# Patient Record
Sex: Female | Born: 1960 | Race: Black or African American | Hispanic: No | Smoking: Current every day smoker
Health system: Southern US, Community
[De-identification: ages and names within clinical notes are randomized; demographics above are authoritative.]

## PROBLEM LIST (undated history)

## (undated) DIAGNOSIS — I472 Ventricular tachycardia, unspecified: Secondary | ICD-10-CM

## (undated) DIAGNOSIS — Z91199 Patient's noncompliance with other medical treatment and regimen due to unspecified reason: Secondary | ICD-10-CM

## (undated) DIAGNOSIS — I10 Essential (primary) hypertension: Secondary | ICD-10-CM

## (undated) DIAGNOSIS — I255 Ischemic cardiomyopathy: Secondary | ICD-10-CM

## (undated) DIAGNOSIS — F1910 Other psychoactive substance abuse, uncomplicated: Secondary | ICD-10-CM

## (undated) DIAGNOSIS — Z9119 Patient's noncompliance with other medical treatment and regimen: Secondary | ICD-10-CM

## (undated) DIAGNOSIS — E785 Hyperlipidemia, unspecified: Secondary | ICD-10-CM

## (undated) DIAGNOSIS — Z720 Tobacco use: Secondary | ICD-10-CM

## (undated) DIAGNOSIS — F1410 Cocaine abuse, uncomplicated: Secondary | ICD-10-CM

## (undated) DIAGNOSIS — I2510 Atherosclerotic heart disease of native coronary artery without angina pectoris: Secondary | ICD-10-CM

## (undated) DIAGNOSIS — I219 Acute myocardial infarction, unspecified: Secondary | ICD-10-CM

## (undated) DIAGNOSIS — R9431 Abnormal electrocardiogram [ECG] [EKG]: Secondary | ICD-10-CM

## (undated) HISTORY — PX: ABDOMINAL HYSTERECTOMY: SHX81

## (undated) HISTORY — PX: BREAST SURGERY: SHX581

---

## 2002-04-25 HISTORY — PX: CARDIAC CATHETERIZATION: SHX172

## 2002-11-24 ENCOUNTER — Inpatient Hospital Stay (HOSPITAL_COMMUNITY): Admission: EM | Admit: 2002-11-24 | Discharge: 2002-11-28 | Payer: Self-pay | Admitting: Emergency Medicine

## 2002-11-24 ENCOUNTER — Encounter: Payer: Self-pay | Admitting: Emergency Medicine

## 2002-12-12 ENCOUNTER — Encounter: Admission: RE | Admit: 2002-12-12 | Discharge: 2002-12-12 | Payer: Self-pay | Admitting: Internal Medicine

## 2002-12-28 ENCOUNTER — Encounter: Payer: Self-pay | Admitting: Emergency Medicine

## 2002-12-29 ENCOUNTER — Encounter: Payer: Self-pay | Admitting: Emergency Medicine

## 2002-12-29 ENCOUNTER — Inpatient Hospital Stay (HOSPITAL_COMMUNITY): Admission: AD | Admit: 2002-12-29 | Discharge: 2002-12-29 | Payer: Self-pay | Admitting: Obstetrics

## 2003-01-17 ENCOUNTER — Ambulatory Visit: Admission: RE | Admit: 2003-01-17 | Discharge: 2003-01-17 | Payer: Self-pay | Admitting: Obstetrics

## 2003-01-23 ENCOUNTER — Encounter: Admission: RE | Admit: 2003-01-23 | Discharge: 2003-01-23 | Payer: Self-pay | Admitting: Internal Medicine

## 2003-01-30 ENCOUNTER — Inpatient Hospital Stay (HOSPITAL_COMMUNITY): Admission: RE | Admit: 2003-01-30 | Discharge: 2003-02-05 | Payer: Self-pay | Admitting: Obstetrics

## 2003-01-30 ENCOUNTER — Encounter (INDEPENDENT_AMBULATORY_CARE_PROVIDER_SITE_OTHER): Payer: Self-pay

## 2003-02-01 ENCOUNTER — Encounter: Payer: Self-pay | Admitting: Obstetrics & Gynecology

## 2003-11-16 ENCOUNTER — Emergency Department (HOSPITAL_COMMUNITY): Admission: EM | Admit: 2003-11-16 | Discharge: 2003-11-16 | Payer: Self-pay | Admitting: *Deleted

## 2003-11-24 ENCOUNTER — Emergency Department (HOSPITAL_COMMUNITY): Admission: EM | Admit: 2003-11-24 | Discharge: 2003-11-24 | Payer: Self-pay | Admitting: Emergency Medicine

## 2005-02-22 ENCOUNTER — Emergency Department (HOSPITAL_COMMUNITY): Admission: EM | Admit: 2005-02-22 | Discharge: 2005-02-23 | Payer: Self-pay | Admitting: Emergency Medicine

## 2007-04-17 ENCOUNTER — Emergency Department (HOSPITAL_COMMUNITY): Admission: EM | Admit: 2007-04-17 | Discharge: 2007-04-18 | Payer: Self-pay | Admitting: Emergency Medicine

## 2007-04-20 ENCOUNTER — Emergency Department (HOSPITAL_COMMUNITY): Admission: EM | Admit: 2007-04-20 | Discharge: 2007-04-20 | Payer: Self-pay | Admitting: Emergency Medicine

## 2009-03-22 ENCOUNTER — Emergency Department (HOSPITAL_COMMUNITY): Admission: EM | Admit: 2009-03-22 | Discharge: 2009-03-22 | Payer: Self-pay | Admitting: Emergency Medicine

## 2010-09-10 NOTE — Op Note (Signed)
NAME:  Natasha Roach, Natasha Roach                         ACCOUNT NO.:  [id number]   MEDICAL RECORD NO.:  [id number]                   PATIENT TYPE:  INP   LOCATION:  9307                                 FACILITY:  [facility]   PHYSICIAN:  Charles A. Clearance Coots, M.D.             DATE OF BIRTH:  [birth year]   DATE OF PROCEDURE:  01/30/2003  DATE OF DISCHARGE:                                 OPERATIVE REPORT   PREOPERATIVE DIAGNOSES:  1. Menorrhagia.  2. Uterine fibroids.  3. Dermoid cyst of left ovary.   POSTOPERATIVE DIAGNOSES:  1. Menorrhagia.  2. Uterine fibroids.  3. Dermoid cyst of left ovary.  4. Multiple pelvic adhesions.   PROCEDURE:  Total abdominal hysterectomy, left ovarian cystectomy, lysis of  multiple pelvic adhesions.   SURGEON:  Charles A. Clearance Coots, M.D.   ASSISTANT:  Roseanna Rainbow, M.D.   ANESTHESIA:  General anesthesia.   ESTIMATED BLOOD LOSS:  300 mL.   COMPLICATIONS:  None.   DRAINS:  Foley to gravity.   FINDINGS:  Multiple pelvic adhesions between the small-bowel and peritoneal  surfaces and the large-bowel and peritoneal surfaces.  Uterine fibroids.  Large left ovarian cyst.   SPECIMENS:  Uterus with cervix.  Left ovarian cyst.   DESCRIPTION OF PROCEDURE:  The patient was brought to the operating room and  after satisfactory general endotracheal anesthesia, the abdomen was prepped  and draped in the usual sterile fashion.  A Pfannenstiel skin incision was  made through the previous scar from cesarean sections.  Incision was  extended down into the fascia with the scalpel.  Fascia was nicked in the  midline and the fascial incision was extended to the left and to the right  with curved Mayo scissors.  The superior and inferior fascial edges were  taken off the rectus muscles both sharp and blunt dissection.  the rectus  muscle was sharply divided in the midline, both superiorly and inferiorly,  being careful to avoid the urinary bladder inferiorly.  The  peritoneum was  entered digitally and was sharply incised both superiorly and inferiorly,  being careful to avoid the urinary bladder inferiorly.  Upon entering the  peritoneum, it was noted that there were multiple small-bowel adhesions to  the peritoneal surfaces surrounding the uterus and these were sharply  dissected away from the peritoneum.  On deeper surfaces, the colon was also  adhered to the sidewall peritoneum and these were sharply dissected away.  A  few areas of the serosalization of the large and small-bowel were repaired  with interrupted sutures of 3-0 silk.  After all adhesions were sharply  dissected away, the O'Connor-O'Sullivan retractor was then placed in the  incision.  The bowel was packed off and the anterior and posterior blades  were placed.  Kelly forceps were placed across the utero-ovarian ligaments  and round ligaments bilaterally.  The round ligaments were sharply dissected  with the cutting mode  of the Bovie bilaterally and the anterior  vesicouterine peritoneum was sharply dissected and the bladder flap was  separated sharply from the anterior surface of the lower uterine segment and  cervix and the urinary bladder was pushed down and away from the operative  field.  The shiny surface of the cervical area, lower uterine segment could  be visualized easily.  A window in the medial aspect of the broad ligament  was created digitally and the Kelly forceps was advanced downward into this  window and a parametrial clamp was placed across the utero-ovarian ligament  beneath the Kelly forceps through the window created in the broad ligament.  The utero-ovarian ligament was then transected and free tie was placed  beneath the parametrial clamp bilaterally.  The uterine vessels were then  skeletonized bilaterally and a curved parametrial clamps were placed across  the uterine vessels at a right angle bilaterally and uterine vessels were  transected and suture  ligated with transfixion sutures of 0 Vicryl.  The  large uterine fundus with fibroids was then transected away from the lower  uterine segment and submitted to pathology for evaluation.  The cervical end  of the uterus was then grasped with Kocher forceps and cardinal ligaments  were clamped serially bilaterally with parametrial clamps and suture ligated  with transfixion sutures down to the uterosacral ligaments.  The uterosacral  ligaments were then crossclamped with curved parametrial clamps and suture  ligated with transfixion sutures of 0 Vicryl bilaterally.  The parametrial  clamps were then placed across the vaginal cuff bilaterally and the cervix  was amputated away from the vaginal cuff.  The vaginal cuff was closed in  the corners with transfixion sutures of 0 Vicryl and the center of the  vaginal cuff was closed with interrupted sutures of 0 Vicryl.  Hemostasis  was excellent.  The bowel was inspected for any areas of further  deserosalization and there were areas of repair of the serosa was intact and  there was no active bleeding noted.  All pedicles were observed and there  was no active bleeding from the pedicles.  The left ovarian cyst was  actually removed from the left ovary prior to beginning the hysterectomy.  The capsule was circumscribed near the base with scalpel and the capsule was  undermined with Metzenbaum scissors and the large ovarian cyst was removed  intact from the base of the viable ovarian tissue and submitted to pathology  for evaluation.  The viable ovarian tissue that was remaining was then  closed with running 3-0 Monocryl plication sutures of the base continuing  out toward the serosal surface and the serosal surface was closed with  baseball type stitch with a continuation from the suture closure of the  base.  There was no active bleeding noted at the conclusion of the  procedure.  Pelvic cavity was thoroughly irrigated with warm saline solution.   All clots were removed.  All instruments were then retired and  the packing was removed from the abdominal cavity.  The surgical technician  indicated that all sponge, needle and instrument counts were correct.  The  abdomen was then closed as follows.  The rectus muscle was approximated with  interrupted sutures of 0 Monocryl.  The fascia was closed with continuous  sutures of PDS from each corner to the center.  Subcutaneous tissue was  thoroughly irrigated with warm saline solution and all areas of subcutaneous  bleeding were coagulated with the Bovie.  Skin was  then approximated with  continuous subcuticular suture of 3-0 Monocryl.  Sterile bandages applied to  the incision closure.  Surgical technician indicated that the second count  was also correct.  The patient tolerated the procedure well and was  transported to the recovery room in satisfactory condition.                                               Charles A. Clearance Coots, M.D.    CAH/MEDQ  D:  01/30/2003  T:  01/30/2003  Job:  [id number]

## 2010-09-10 NOTE — Discharge Summary (Signed)
NAME:  Natasha Roach, Natasha Roach                         ACCOUNT NO.:  [id number]   MEDICAL RECORD NO.:  [id number]                   PATIENT TYPE:  INP   LOCATION:  9307                                 FACILITY:  [facility]   PHYSICIAN:  Charles A. Clearance Coots, M.D.             DATE OF BIRTH:  January 08, 1961   DATE OF ADMISSION:  01/30/2003  DATE OF DISCHARGE:  02/05/2003                                 DISCHARGE SUMMARY   ADMISSION DIAGNOSES:  1. Menorrhagia.  2. Symptomatic uterine fibroid.  3. Dermoid cyst of left ovary.   DISCHARGE DIAGNOSES:  1. Menorrhagia.  2. Symptomatic uterine fibroid.  3. Dermoid cyst of left ovary.  4. Multiple pelvic adhesions.  5. Status post total abdominal hysterectomy, left ovarian cystectomy, and     lysis of multiple adhesions.   DISPOSITION:  Discharged home in good condition.   REASON FOR ADMISSION:  A 50 year old, black female, G4, P2, with a history  of heavy prolonged periods with severe cramping, worse over the past year.  The patient presented to the emergency room at [facility] for chest  pain after substance abuse with cocaine.  She was admitted and her workup,  which included a CT scan revealed a large dermoid cyst of the ovary  approximately 10 cm in size and also a large posterior uterine fibroid.  The  patient was cleared of coronary artery obstruction on her heart  catheterization and was subsequently discharged from the hospital.  The  chest pain that she had on presentation to [facility] was thought  to be caused by spasm from cocaine abuse.  Upon evaluation in the office,  surgical approaches were discussed with the patient.  Because of the severe  anemia caused by her heavy bleeding and pain with her fibroids, the patient  elected definitive surgical therapy with hysterectomy along with removal of  the dermoid cyst from her ovary.   PAST SURGICAL HISTORY:  1. Cesarean section x 2.  2. Ovarian oophorectomy.  3. Breast  surgery.   PAST MEDICAL HISTORY:  1. Substance abuse.  2. Hypertension.  3. Anemia.   MEDICATIONS:  Norvasc, iron, and Colace.   ALLERGIES:  No known drug allergies.   SOCIAL HISTORY:  Single.  Positive history of substance abuse and tobacco.  Negative history of alcohol.   PHYSICAL EXAMINATION:  GENERAL APPEARANCE:  A well-nourished, well-  developed, black female in no acute distress.  VITAL SIGNS:  Afebrile.  Vital signs were stable.  HEENT:  Missing front teeth upper.  NECK:  Supple without adenopathy.  LUNGS:  Clear to auscultation bilaterally.  HEART:  Regular rate and rhythm.  ABDOMEN:  Soft and nontender.  No organomegaly appreciated.  PELVIC:  Normal external female genitalia.  Vaginal mucosa normal.  Cervix  without lesions, discharge, or bleeding.  Uterus enlarged approximately 12  weeks size and tender.  Fullness in left adnexa and nontender.  EXTREMITIES:  Without cyanosis, clubbing, or edema.   LABORATORY VALUES:  Hemoglobin 12.8, hematocrit 38.6, white blood cell count  11,300, platelets 263,000.  Basic metabolic panel within normal limits.  Coagulation times within normal limits.  Urinalysis within normal limits.   HOSPITAL COURSE:  The patient underwent a total abdominal hysterectomy, left  ovarian cystectomy, and lysis of multiple pelvic adhesions on January 30, 2003.  There were no intraoperative complications.  The postoperative course  was uncomplicated, except for a low-grade increase in temperature.  The  chest x-ray was within normal limits.  The patient continued to improve and  on the day of discharge still had a low-grade increase in temperature of  around 100 degrees, but clinically was in good condition.  The patient was  therefore discharged home on postoperative day #6 in good condition.   DISCHARGE LABORATORY VALUES:  Hemoglobin 10.6, hematocrit 31.5, white blood  cell count 14.6, platelets 252,000.   DISCHARGE MEDICATIONS:  1. Doxycycline  100 mg p.o. twice a day for seven days.  2. Flagyl 500 mg twice a day for seven days.  3. Tylox one to two tablets every three to four hours as needed for pain.  4. Continue vitamins and iron.   DISCHARGE INSTRUCTIONS:  Routine written postsurgical instructions were  given to the patient by booklet.   FOLLOWUP:  She is to follow up with Dr. Clearance Coots in the office at the [facility] in two weeks.                                               Charles A. Clearance Coots, M.D.    CAH/MEDQ  D:  02/05/2003  T:  02/05/2003  Job:  [id number]   cc:   Delbert Harness, MD

## 2011-04-05 ENCOUNTER — Emergency Department (HOSPITAL_COMMUNITY)
Admission: EM | Admit: 2011-04-05 | Discharge: 2011-04-05 | Disposition: A | Discharge status: Home / Self Care | Payer: Self-pay

## 2011-07-26 ENCOUNTER — Other Ambulatory Visit (HOSPITAL_COMMUNITY): Payer: Self-pay | Admitting: Psychiatry

## 2012-09-07 ENCOUNTER — Emergency Department (HOSPITAL_COMMUNITY)
Admission: EM | Admit: 2012-09-07 | Discharge: 2012-09-07 | Disposition: A | Discharge status: Home / Self Care | Payer: Self-pay | Attending: Emergency Medicine | Admitting: Emergency Medicine

## 2012-09-07 ENCOUNTER — Encounter (HOSPITAL_COMMUNITY): Payer: Self-pay

## 2012-09-07 ENCOUNTER — Emergency Department (HOSPITAL_COMMUNITY): Payer: Self-pay

## 2012-09-07 ENCOUNTER — Other Ambulatory Visit: Payer: Self-pay

## 2012-09-07 DIAGNOSIS — F1410 Cocaine abuse, uncomplicated: Secondary | ICD-10-CM | POA: Insufficient documentation

## 2012-09-07 DIAGNOSIS — R079 Chest pain, unspecified: Secondary | ICD-10-CM

## 2012-09-07 DIAGNOSIS — F17200 Nicotine dependence, unspecified, uncomplicated: Secondary | ICD-10-CM | POA: Insufficient documentation

## 2012-09-07 DIAGNOSIS — I252 Old myocardial infarction: Secondary | ICD-10-CM | POA: Insufficient documentation

## 2012-09-07 DIAGNOSIS — N390 Urinary tract infection, site not specified: Secondary | ICD-10-CM | POA: Insufficient documentation

## 2012-09-07 HISTORY — DX: Acute myocardial infarction, unspecified: I21.9

## 2012-09-07 LAB — COMPREHENSIVE METABOLIC PANEL WITH GFR
ALT: 29 U/L (ref 0–35)
Albumin: 4.1 g/dL (ref 3.5–5.2)
Alkaline Phosphatase: 127 U/L — ABNORMAL HIGH (ref 39–117)
Calcium: 9.6 mg/dL (ref 8.4–10.5)
Chloride: 106 meq/L (ref 96–112)
Creatinine, Ser: 1.05 mg/dL (ref 0.50–1.10)
GFR calc Af Amer: 71 mL/min — ABNORMAL LOW (ref >90)

## 2012-09-07 LAB — CBC
HCT: 42.7 % (ref 36.0–46.0)
Hemoglobin: 14.4 g/dL (ref 12.0–15.0)
MCH: 28.1 pg (ref 26.0–34.0)
MCHC: 33.7 g/dL (ref 30.0–36.0)
MCV: 83.2 fL (ref 78.0–100.0)
Platelets: 255 10*3/uL (ref 150–400)
RBC: 5.13 MIL/uL — ABNORMAL HIGH (ref 3.87–5.11)
RDW: 12.8 % (ref 11.5–15.5)
WBC: 14.5 K/uL — ABNORMAL HIGH (ref 4.0–10.5)

## 2012-09-07 LAB — URINALYSIS, ROUTINE W REFLEX MICROSCOPIC
Bilirubin Urine: NEGATIVE
Glucose, UA: NEGATIVE mg/dL
Ketones, ur: NEGATIVE mg/dL
Nitrite: POSITIVE — AB
Protein, ur: NEGATIVE mg/dL
Specific Gravity, Urine: 1.023 (ref 1.005–1.030)
Urobilinogen, UA: 0.2 mg/dL (ref 0.0–1.0)
pH: 5.5 (ref 5.0–8.0)

## 2012-09-07 LAB — URINE MICROSCOPIC-ADD ON

## 2012-09-07 LAB — RAPID URINE DRUG SCREEN, HOSP PERFORMED
Amphetamines: NOT DETECTED
Barbiturates: NOT DETECTED
Benzodiazepines: NOT DETECTED
Cocaine: POSITIVE — AB
Opiates: NOT DETECTED
Tetrahydrocannabinol: NOT DETECTED

## 2012-09-07 LAB — COMPREHENSIVE METABOLIC PANEL
AST: 20 U/L (ref 0–37)
BUN: 17 mg/dL (ref 6–23)
CO2: 26 mEq/L (ref 19–32)
GFR calc non Af Amer: 61 mL/min — ABNORMAL LOW (ref >90)
Glucose, Bld: 95 mg/dL (ref 70–99)
Potassium: 3.9 mEq/L (ref 3.5–5.1)
Sodium: 142 mEq/L (ref 135–145)
Total Bilirubin: 0.3 mg/dL (ref 0.3–1.2)
Total Protein: 7.4 g/dL (ref 6.0–8.3)

## 2012-09-07 LAB — POCT I-STAT TROPONIN I: Troponin i, poc: 0 ng/mL (ref 0.00–0.08)

## 2012-09-07 MED ORDER — CEFPODOXIME PROXETIL 200 MG PO TABS
200.0000 mg | ORAL_TABLET | Freq: Once | ORAL | Status: AC
  Administered 2012-09-07: 200 mg via ORAL
  Filled 2012-09-07: qty 1

## 2012-09-07 MED ORDER — NITROFURANTOIN MONOHYD MACRO 100 MG PO CAPS: 100.0000 mg | ORAL_CAPSULE | Freq: Two times a day (BID) | ORAL | Status: AC

## 2012-09-07 MED ORDER — LORAZEPAM 1 MG PO TABS
1.0000 mg | ORAL_TABLET | Freq: Once | ORAL | Status: AC
  Administered 2012-09-07: 1 mg via ORAL
  Filled 2012-09-07: qty 1

## 2012-09-07 NOTE — ED Provider Notes (Signed)
History     CSN: [id number]  Arrival date & time 09/07/12  2036   First MD Initiated Contact with Patient 09/07/12 2100      Chief Complaint  Patient presents with  . Chest Pain    (Consider location/radiation/quality/duration/timing/severity/associated sxs/prior treatment) HPI Comments: 52 y.o. 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 Pt states that she had stopped using cocaine for several years, but over the past few months she has relapsed. She states she used cocaine last night and then felt a pressure on the left side of her chest and radiation to her left arm -- this worsened this morning / today during work and this prompted her to come to the ER . Pt states the episode was similar to when she used cocaine in the past and it caused her to have a heart attack. She states her pain is almost "completely gone" after she was given nitro.   Patient is a 52 y.o. female presenting with chest pain. The history is provided by the patient.  Chest Pain Pain location:  L chest Pain quality: aching and dull   Pain radiates to:  L arm Pain radiates to the back: no   Pain severity:  Mild Timing:  Constant Context: drug use   Context: not breathing   Relieved by: nitro given en route with improvement of symptoms. Associated symptoms: no abdominal pain, no cough, no dizziness, no dysphagia, no fatigue, no fever, no headache and not vomiting     Past Medical History  Diagnosis Date  . 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 Breast surgery      abcess  . Abdominal hysterectomy      No family history on file.  History  Substance Use Topics  . Smoking status: Current Every Day Smoker  . Smokeless tobacco: Not on file  . Alcohol Use: No    OB History   Grav Para Term Preterm  Abortions TAB SAB Ect Mult Living                  Review of Systems  Constitutional: Negative for fever, chills and fatigue.  HENT: Negative for facial swelling, drooling, trouble swallowing, neck pain and dental problem.   Eyes: Negative for pain, discharge and itching.  Respiratory: Negative for cough, choking, wheezing and stridor.   Cardiovascular: Positive for chest pain.  Gastrointestinal: Negative for vomiting, abdominal pain and diarrhea.  Endocrine: Negative for cold intolerance and heat intolerance.  Genitourinary: Negative for vaginal discharge, difficulty urinating and vaginal pain.  Skin: Negative for pallor and rash.  Neurological: Negative for dizziness, light-headedness and headaches.  Psychiatric/Behavioral: Negative for behavioral problems and agitation.    Allergies  Review of patient's allergies indicates no known allergies.  Home Medications  No current outpatient prescriptions on file.  BP 125/81  Pulse 65  Temp(Src) 98.2 F (36.8 C) (Oral)  Resp 16  Ht 5\' 7"  (1.702 m)  Wt 212 lb (96.163 kg)  BMI 33.2 kg/m2  SpO2 100%  Physical Exam  Constitutional: She is oriented to person, place, and time. She appears well-developed. No distress.  HENT:  Head: Normocephalic and atraumatic.  Eyes: Pupils are equal, round, and reactive to light. Right eye exhibits no discharge. Left eye exhibits  no discharge.  Neck: Neck supple. No tracheal deviation present.  Cardiovascular: Normal rate.  Exam reveals no gallop and no friction rub.   Pulmonary/Chest: No stridor. No respiratory distress. She has no wheezes.  Abdominal: Soft. She exhibits no distension. There is no tenderness. There is no rebound.  Musculoskeletal: She exhibits no edema and no tenderness.  Neurological: She is alert and oriented to person, place, and time.  Skin: Skin is warm. She is not diaphoretic.    ED Course  Procedures (including critical care time)  Labs Reviewed  CBC - Abnormal;  Notable for the following:    WBC 14.5 (*)    RBC 5.13 (*)    All other components within normal limits  COMPREHENSIVE METABOLIC PANEL - Abnormal; Notable for the following:    Alkaline Phosphatase 127 (*)    GFR calc non Af Amer 61 (*)    GFR calc Af Amer 71 (*)    All other components within normal limits  URINALYSIS, ROUTINE W REFLEX MICROSCOPIC - Abnormal; Notable for the following:    APPearance CLOUDY (*)    Hgb urine dipstick MODERATE (*)    Nitrite POSITIVE (*)    Leukocytes, UA MODERATE (*)    All other components within normal limits  URINE RAPID DRUG SCREEN (HOSP PERFORMED) - Abnormal; Notable for the following:    Cocaine POSITIVE (*)    All other components within normal limits  URINE MICROSCOPIC-ADD ON - Abnormal; Notable for the following:    Bacteria, UA MANY (*)    All other components within normal limits  URINE CULTURE  POCT I-STAT TROPONIN I   Dg Chest 2 View  09/07/2012   *RADIOLOGY REPORT*  Clinical Data: Chest pain. Shortness of breath.  CHEST - 2 VIEW  Comparison: 02/22/2005  Findings: Cardiac and mediastinal contours appear normal.  The lungs appear clear.  No pleural effusion is identified.  IMPRESSION:  No significant abnormality identified.   Original Report Authenticated By: Gaylyn Rong, M.D.     ECG shows sinus rhythm, similar to prior ECG, however, today's ECG shows resolution of slightly elevated ST changes in V2/V3 read and resolution of T wave abnormalities.   MDM   Will give ativan for chest pain secondary to most likely etiology being use of cocaine. Have educated pt on cocaine abuse as well and long term consequences.   Pt's trop is negative. She is not having chest pain any longer. Do not suspect ACS. W/ patient's use of cocaine now at near 24 hours ago, would have expected pt's trop to be elevated if truly ACS. Found to have UTI -- will tx with vantin. Pt is discharged and told to f/u with pcp within two days for re-evaluation of  symptoms.   1. UTI (lower urinary tract infection)   2. Cocaine abuse   3. Chest pain              Bernadene Person, MD 09/08/12 1610

## 2012-09-07 NOTE — ED Notes (Signed)
[facility]-pt brought from work.  pt c/o of cp since last night. sts cp started after using cocaine last night.  Pain is mainly in her left chest radiating into her left arm.  Hx of mi 2004-cocaine induced.  Pt was given enroute ASA 324mg  and nitro x 1 with some relief.  22g left hand placed.

## 2012-09-07 NOTE — ED Notes (Signed)
Waiting on medication from pharmacy before discharging patient 

## 2012-09-07 NOTE — ED Provider Notes (Signed)
I saw and evaluated the patient, reviewed the resident's note and I agree with the findings and plan.  EKG at time 20:42, shows sinus rhythm at a rate of 71, RSR Prime in leads V1 and V2, no ST or T-wave abnormalities. Axis is normal. Interpretation is borderline ECG. No significant change compared to EKG from 02/22/2005. Patient with history of coronary disease related to cocaine abuse. Patient is positive for cocaine use here. Patient's symptoms are improved. Plan is to rule out with serial enzymes. EKG shows no new ischemic changes. His troponins are negative, patient continues to be chest pain-free, feel comfortable discharging the patient home with referrals for drug abuse and encouraged followup with primary care physician.  No suspicion for PE.  CXR shows no acute.  Lungs clear Intact peripheral pulses RRR, no murmur   Impression CP Cocaine abuse   Gavin Pound. Oletta Lamas, MD 09/07/12 2322

## 2012-09-08 NOTE — ED Provider Notes (Signed)
I saw and evaluated the patient, reviewed the resident's note and I agree with the findings and plan.  I reviewed and agree with ECG interpretation by Dr. Donette Larry.  Pt after using occaine yesterday has had development of CP similar to prior MI.  This was previously also in setting of cocaine abuse.  Pt is educated.  Troponin is neg.  PT's symptoms resolved in the ED.  No sig ECG changes.  Pt encouraged to stop drug abuse, referrals for outpt abuse treatment   Impression: Cocaine abuse Chest pain   Natasha Roach. Oletta Lamas, MD 09/08/12 1610

## 2012-09-10 LAB — URINE CULTURE: Colony Count: >=100000

## 2012-09-11 ENCOUNTER — Telehealth (HOSPITAL_COMMUNITY): Payer: Self-pay | Admitting: Emergency Medicine

## 2012-09-11 NOTE — ED Notes (Signed)
Post ED Visit - Positive Culture Follow-up  Culture report reviewed by antimicrobial stewardship pharmacist: []  [name], Pharm.D., BCPS [x]  [name], Pharm.D., BCPS []  [name], [address].D., BCPS []  [name], [name], BCPS, AAHIVP []  [name], Pharm.D., BCPS, AAHIV  Positive urine culture Treated with Macrobid, organism sensitive to the same and no further patient follow-up is required at this time.  Natasha Roach 09/11/2012, 9:37 AM

## 2015-12-25 ENCOUNTER — Encounter (HOSPITAL_COMMUNITY): Payer: Self-pay | Admitting: Emergency Medicine

## 2015-12-25 ENCOUNTER — Emergency Department (HOSPITAL_COMMUNITY)
Admission: EM | Admit: 2015-12-25 | Discharge: 2015-12-26 | Disposition: A | Discharge status: Home / Self Care | Payer: Self-pay | Attending: Emergency Medicine | Admitting: Emergency Medicine

## 2015-12-25 DIAGNOSIS — I252 Old myocardial infarction: Secondary | ICD-10-CM | POA: Insufficient documentation

## 2015-12-25 DIAGNOSIS — M5431 Sciatica, right side: Secondary | ICD-10-CM | POA: Insufficient documentation

## 2015-12-25 DIAGNOSIS — F17200 Nicotine dependence, unspecified, uncomplicated: Secondary | ICD-10-CM | POA: Insufficient documentation

## 2015-12-25 NOTE — ED Triage Notes (Signed)
Patient reports persistent right lower back pain radiating to right buttocks and right thigh worse with movement/ambulation onset last week , denies injury or fall . No hematuria or urinary discomfort.

## 2015-12-25 NOTE — ED Provider Notes (Signed)
[facility] DEPT Provider Note   CSN: [id number] Arrival date & time: 12/25/15  2115     History   Chief Complaint Chief Complaint  Patient presents with  . Back Pain    HPI Tonye RoyaltyMary A Roach is a 55 y.o. female.  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  She has history of low back pain. She says that one week ago she accidentally twisted wrong and heard a crunch in her right glute, the next day she saw swelling and as the days progressed she has had pain to the right glut that goes down into her right leg. She has not had any weakness or numbness. The pain is an 8/10, described as sharp, shooting, intermittent, and deep pain.    Past Medical History:  Diagnosis Date  . 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   Past Surgical History:  Procedure Laterality Date  . ABDOMINAL HYSTERECTOMY    . BREAST SURGERY     abcess    OB History    No data available       Home Medications    Prior to Admission medications   Medication Sig Start Date End Date Taking? Authorizing Provider  ibuprofen (ADVIL,MOTRIN) 200 MG tablet Take 600 mg by mouth every 6 (six) hours as needed for moderate pain.   Yes Historical Provider, MD  cyclobenzaprine (FLEXERIL) 10 MG tablet Take 0.5-1 tablets (5-10 mg total) by mouth 2 (two) times daily as needed. 12/26/15   Marlon Peliffany Trev Boley, PA-C  HYDROcodone-acetaminophen (NORCO/VICODIN) 5-325 MG tablet Take 1 tablet by mouth every 6 (six) hours as needed for severe pain. 12/26/15   Phillp Dolores Neva SeatGreene, PA-C  predniSONE (DELTASONE) 10 MG tablet Take 2 tablets (20 mg total) by mouth daily. 12/26/15   Marlon Peliffany Myrtle Haller, PA-C    Family History No family history on file.  Social History Social History  Substance Use Topics  . Smoking status: Current Every Day Smoker  . Smokeless tobacco: Not on file  . Alcohol use No     Allergies   Review  of patient's allergies indicates no known allergies.   Review of Systems Review of Systems  ROS: No TIA's or unusual headaches, no dysphagia.  No prolonged cough. No dyspnea or chest pain on exertion.  No abdominal pain, change in bowel habits, black or bloody stools.  No urinary tract or vaginal symptoms.  No new or unusual musculoskeletal symptoms..  Physical Exam Updated Vital Signs BP 127/74   Pulse 64   Temp 98.6 F (37 C) (Oral)   Resp 17   SpO2 100%   Physical Exam  Constitutional: She appears well-developed and well-nourished.  HENT:  Head: Normocephalic and atraumatic.  Eyes: Conjunctivae are normal. Pupils are equal, round, and reactive to light.  Neck: Trachea normal, normal range of motion and full passive range of motion without pain. Neck supple.  Cardiovascular: Normal rate, regular rhythm and normal pulses.   Pulmonary/Chest: Effort normal and breath sounds normal. Chest wall is not dull to percussion. She exhibits no tenderness, no crepitus, no edema, no deformity and no retraction.  Abdominal: Soft. Normal appearance and bowel sounds are normal.  Musculoskeletal: Normal range of motion.       Right hip: She exhibits tenderness. She exhibits normal range of motion, normal strength, no bony tenderness, no swelling, no crepitus, no deformity and  no laceration.  Pt with pin point pain to the sciatic notch, the pain is illicite to shoot down her right leg with pressure to this area. Intact sensation and normal distal pulses.  Neurological: She is alert. She has normal strength.  Skin: Skin is warm, dry and intact.  Psychiatric: She has a normal mood and affect. Her speech is normal and behavior is normal. Judgment and thought content normal. Cognition and memory are normal.    ED Treatments / Results  Labs (all labs ordered are listed, but only abnormal results are displayed) Labs Reviewed - No data to display  EKG  EKG Interpretation None        Radiology No results found.  Procedures Procedures (including critical care time)  Medications Ordered in ED Medications  predniSONE (DELTASONE) tablet 60 mg (60 mg Oral Given 12/26/15 0037)  oxyCODONE-acetaminophen (PERCOCET/ROXICET) 5-325 MG per tablet 2 tablet (2 tablets Oral Given 12/26/15 0038)  cyclobenzaprine (FLEXERIL) tablet 5 mg (5 mg Oral Given 12/26/15 0037)     Initial Impression / Assessment and Plan / ED Course  I have reviewed the triage vital signs and the nursing notes.  Pertinent labs & imaging results that were available during my care of the patient were reviewed by me and considered in my medical decision making (see chart for details).  Clinical Course    55 y.o.Natasha Roach's  with back pain.   No neurological deficits and normal neuro exam. No loss of bowel or bladder control. No concern for cauda equina at this time base on HPI and physical exam findings. No fever, night sweats, weight loss, h/o cancer, IVDU. The patient can walk with some discomfort.   Patient Plan 1. Medications: NSAIDs and/or muscle relaxer. Cont usual home medications unless otherwise directed. 2. Treatment: rest, drink plenty of fluids, gentle stretching as discussed, alternate ice and heat  3. Follow Up: Please followup with your primary doctor for discussion of your diagnoses and further evaluation after today's visit; if you do not have a primary care doctor use the resource guide provided to find one  Advised to follow-up with the orthopedist if symptoms do not start to resolve in the next 2-3 days. If develop loss of bowel or urinary control return to the ED as soon as possible for further evaluation. To take the medications as prescribed as they can cause harm if not taken appropriately.   Vital signs are stable at discharge. Vitals:   12/25/15 2119 12/26/15 0020  BP: 151/90 127/74  Pulse: 105 64  Resp: 18 17  Temp: 98.6 F (37 C)     Patient/guardian has  voiced understanding and agreed to follow-up with the PCP or specialist.      Final Clinical Impressions(s) / ED Diagnoses   Final diagnoses:  Sciatica of right side    New Prescriptions New Prescriptions   CYCLOBENZAPRINE (FLEXERIL) 10 MG TABLET    Take 0.5-1 tablets (5-10 mg total) by mouth 2 (two) times daily as needed.   HYDROCODONE-ACETAMINOPHEN (NORCO/VICODIN) 5-325 MG TABLET    Take 1 tablet by mouth every 6 (six) hours as needed for severe pain.   PREDNISONE (DELTASONE) 10 MG TABLET    Take 2 tablets (20 mg total) by mouth daily.     Marlon Pel, PA-C 12/26/15 2130    Geoffery Lyons, MD 12/26/15 [phone]

## 2015-12-26 MED ORDER — OXYCODONE-ACETAMINOPHEN 5-325 MG PO TABS
2.0000 | ORAL_TABLET | Freq: Once | ORAL | Status: AC
  Administered 2015-12-26: 2 via ORAL
  Filled 2015-12-26: qty 2

## 2015-12-26 MED ORDER — HYDROCODONE-ACETAMINOPHEN 5-325 MG PO TABS: 1.0000 | ORAL_TABLET | Freq: Four times a day (QID) | ORAL | 0 refills | Status: DC | PRN

## 2015-12-26 MED ORDER — PREDNISONE 20 MG PO TABS
60.0000 mg | ORAL_TABLET | Freq: Once | ORAL | Status: AC
  Administered 2015-12-26: 60 mg via ORAL
  Filled 2015-12-26: qty 3

## 2015-12-26 MED ORDER — CYCLOBENZAPRINE HCL 10 MG PO TABS
5.0000 mg | ORAL_TABLET | Freq: Once | ORAL | Status: AC
  Administered 2015-12-26: 5 mg via ORAL
  Filled 2015-12-26: qty 1

## 2015-12-26 MED ORDER — CYCLOBENZAPRINE HCL 10 MG PO TABS: 5.0000 mg | ORAL_TABLET | Freq: Two times a day (BID) | ORAL | 0 refills | Status: DC | PRN

## 2015-12-26 MED ORDER — PREDNISONE 10 MG PO TABS: 20.0000 mg | ORAL_TABLET | Freq: Every day | ORAL | 0 refills | Status: DC

## 2015-12-31 ENCOUNTER — Inpatient Hospital Stay (HOSPITAL_COMMUNITY)
Admission: EM | Admit: 2015-12-31 | Discharge: 2016-01-02 | DRG: 918 | Disposition: A | Discharge status: Home / Self Care | Payer: Self-pay | Attending: Internal Medicine | Admitting: Internal Medicine

## 2015-12-31 ENCOUNTER — Encounter (HOSPITAL_COMMUNITY): Payer: Self-pay

## 2015-12-31 ENCOUNTER — Inpatient Hospital Stay (HOSPITAL_COMMUNITY): Payer: Self-pay

## 2015-12-31 ENCOUNTER — Ambulatory Visit: Admit: 2015-12-31 | Payer: Self-pay

## 2015-12-31 DIAGNOSIS — I201 Angina pectoris with documented spasm: Secondary | ICD-10-CM | POA: Diagnosis present

## 2015-12-31 DIAGNOSIS — B3749 Other urogenital candidiasis: Secondary | ICD-10-CM | POA: Diagnosis present

## 2015-12-31 DIAGNOSIS — G8929 Other chronic pain: Secondary | ICD-10-CM | POA: Diagnosis present

## 2015-12-31 DIAGNOSIS — F17210 Nicotine dependence, cigarettes, uncomplicated: Secondary | ICD-10-CM | POA: Diagnosis present

## 2015-12-31 DIAGNOSIS — D72829 Elevated white blood cell count, unspecified: Secondary | ICD-10-CM | POA: Diagnosis present

## 2015-12-31 DIAGNOSIS — R079 Chest pain, unspecified: Secondary | ICD-10-CM

## 2015-12-31 DIAGNOSIS — F1410 Cocaine abuse, uncomplicated: Secondary | ICD-10-CM | POA: Diagnosis present

## 2015-12-31 DIAGNOSIS — E785 Hyperlipidemia, unspecified: Secondary | ICD-10-CM | POA: Diagnosis present

## 2015-12-31 DIAGNOSIS — T405X1A Poisoning by cocaine, accidental (unintentional), initial encounter: Principal | ICD-10-CM | POA: Diagnosis present

## 2015-12-31 DIAGNOSIS — Z6831 Body mass index (BMI) 31.0-31.9, adult: Secondary | ICD-10-CM

## 2015-12-31 DIAGNOSIS — K921 Melena: Secondary | ICD-10-CM | POA: Diagnosis present

## 2015-12-31 DIAGNOSIS — I959 Hypotension, unspecified: Secondary | ICD-10-CM | POA: Diagnosis present

## 2015-12-31 DIAGNOSIS — M5440 Lumbago with sciatica, unspecified side: Secondary | ICD-10-CM | POA: Diagnosis present

## 2015-12-31 DIAGNOSIS — I249 Acute ischemic heart disease, unspecified: Secondary | ICD-10-CM | POA: Diagnosis present

## 2015-12-31 DIAGNOSIS — I252 Old myocardial infarction: Secondary | ICD-10-CM

## 2015-12-31 DIAGNOSIS — E1165 Type 2 diabetes mellitus with hyperglycemia: Secondary | ICD-10-CM | POA: Diagnosis present

## 2015-12-31 DIAGNOSIS — A599 Trichomoniasis, unspecified: Secondary | ICD-10-CM | POA: Diagnosis present

## 2015-12-31 HISTORY — DX: Other psychoactive substance abuse, uncomplicated: F19.10

## 2015-12-31 LAB — DIFFERENTIAL
Basophils Absolute: 0.1 10*3/uL (ref 0.0–0.1)
Basophils Relative: 0 %
EOS PCT: 1 %
Eosinophils Absolute: 0.1 10*3/uL (ref 0.0–0.7)
LYMPHS ABS: 5 10*3/uL — AB (ref 0.7–4.0)
LYMPHS PCT: 19 %
MONO ABS: 1.2 10*3/uL — AB (ref 0.1–1.0)
Monocytes Relative: 5 %
NEUTROS ABS: 19.3 10*3/uL — AB (ref 1.7–7.7)
NEUTROS PCT: 75 %

## 2015-12-31 LAB — URINALYSIS, ROUTINE W REFLEX MICROSCOPIC
Bilirubin Urine: NEGATIVE
Glucose, UA: 500 mg/dL — AB
Ketones, ur: NEGATIVE mg/dL
Nitrite: POSITIVE — AB
Protein, ur: NEGATIVE mg/dL
Specific Gravity, Urine: 1.022 (ref 1.005–1.030)
pH: 5.5 (ref 5.0–8.0)

## 2015-12-31 LAB — TROPONIN I: Troponin I: 0.05 ng/mL (ref <0.03)

## 2015-12-31 LAB — TSH: TSH: 0.591 u[IU]/mL (ref 0.350–4.500)

## 2015-12-31 LAB — CBC
HCT: 48.1 % — ABNORMAL HIGH (ref 36.0–46.0)
HEMOGLOBIN: 15.4 g/dL — AB (ref 12.0–15.0)
MCH: 27.5 pg (ref 26.0–34.0)
MCHC: 32 g/dL (ref 30.0–36.0)
MCV: 85.7 fL (ref 78.0–100.0)
PLATELETS: 193 10*3/uL (ref 150–400)
RBC: 5.61 MIL/uL — AB (ref 3.87–5.11)
RDW: 12.7 % (ref 11.5–15.5)
WBC: 26.8 10*3/uL — AB (ref 4.0–10.5)

## 2015-12-31 LAB — BASIC METABOLIC PANEL
ANION GAP: 10 (ref 5–15)
BUN: 14 mg/dL (ref 6–20)
CALCIUM: 8.9 mg/dL (ref 8.9–10.3)
CO2: 21 mmol/L — AB (ref 22–32)
CREATININE: 0.91 mg/dL (ref 0.44–1.00)
Chloride: 105 mmol/L (ref 101–111)
Glucose, Bld: 195 mg/dL — ABNORMAL HIGH (ref 65–99)
Potassium: 4.4 mmol/L (ref 3.5–5.1)
SODIUM: 136 mmol/L (ref 135–145)

## 2015-12-31 LAB — HEPATIC FUNCTION PANEL
ALBUMIN: 3.4 g/dL — AB (ref 3.5–5.0)
ALT: 18 U/L (ref 14–54)
AST: 19 U/L (ref 15–41)
Alkaline Phosphatase: 96 U/L (ref 38–126)
Bilirubin, Direct: <0.1 mg/dL — ABNORMAL LOW (ref 0.1–0.5)
TOTAL PROTEIN: 5.2 g/dL — AB (ref 6.5–8.1)
Total Bilirubin: 0.5 mg/dL (ref 0.3–1.2)

## 2015-12-31 LAB — URINE MICROSCOPIC-ADD ON

## 2015-12-31 LAB — APTT: aPTT: 145 seconds — ABNORMAL HIGH (ref 24–36)

## 2015-12-31 LAB — MAGNESIUM: MAGNESIUM: 2.3 mg/dL (ref 1.7–2.4)

## 2015-12-31 LAB — PROTIME-INR
INR: 1.11
PROTHROMBIN TIME: 14.3 s (ref 11.4–15.2)

## 2015-12-31 LAB — I-STAT TROPONIN, ED: TROPONIN I, POC: 0.01 ng/mL (ref 0.00–0.08)

## 2015-12-31 LAB — MRSA PCR SCREENING: MRSA by PCR: NEGATIVE

## 2015-12-31 SURGERY — LEFT HEART CATH AND CORONARY ANGIOGRAPHY
Anesthesia: LOCAL

## 2015-12-31 MED ORDER — HEPARIN (PORCINE) IN NACL 100-0.45 UNIT/ML-% IJ SOLN
1050.0000 [IU]/h | INTRAMUSCULAR | Status: DC
  Administered 2015-12-31 – 2016-01-01 (×2): 1050 [IU]/h via INTRAVENOUS
  Filled 2015-12-31 (×2): qty 250

## 2015-12-31 MED ORDER — ONDANSETRON HCL 4 MG/2ML IJ SOLN: 4.0000 mg | Freq: Four times a day (QID) | INTRAMUSCULAR | Status: DC | PRN

## 2015-12-31 MED ORDER — NITROGLYCERIN 0.4 MG SL SUBL: 0.4000 mg | SUBLINGUAL_TABLET | SUBLINGUAL | Status: DC | PRN

## 2015-12-31 MED ORDER — ASPIRIN EC 81 MG PO TBEC
81.0000 mg | DELAYED_RELEASE_TABLET | Freq: Every day | ORAL | Status: DC
  Administered 2016-01-01 – 2016-01-02 (×2): 81 mg via ORAL
  Filled 2015-12-31 (×2): qty 1

## 2015-12-31 MED ORDER — ATORVASTATIN CALCIUM 80 MG PO TABS
80.0000 mg | ORAL_TABLET | Freq: Every day | ORAL | Status: DC
  Administered 2016-01-01: 80 mg via ORAL
  Filled 2015-12-31: qty 1

## 2015-12-31 MED ORDER — SODIUM CHLORIDE 0.9 % IV SOLN
INTRAVENOUS | Status: DC
  Administered 2015-12-31 – 2016-01-01 (×2): 75 mL/h via INTRAVENOUS
  Administered 2016-01-01: 10:00:00 via INTRAVENOUS

## 2015-12-31 MED ORDER — HEPARIN BOLUS VIA INFUSION
4000.0000 [IU] | Freq: Once | INTRAVENOUS | Status: DC
  Filled 2015-12-31: qty 4000

## 2015-12-31 MED ORDER — HEPARIN BOLUS VIA INFUSION
3000.0000 [IU] | Freq: Once | INTRAVENOUS | Status: AC
Start: 2015-12-31 — End: 2015-12-31
  Administered 2015-12-31: 3000 [IU] via INTRAVENOUS
  Filled 2015-12-31: qty 3000

## 2015-12-31 MED ORDER — SODIUM CHLORIDE 0.9 % IV BOLUS (SEPSIS)
500.0000 mL | Freq: Once | INTRAVENOUS | Status: AC
  Administered 2015-12-31: 500 mL via INTRAVENOUS

## 2015-12-31 MED ORDER — HYDROCODONE-ACETAMINOPHEN 5-325 MG PO TABS: 1.0000 | ORAL_TABLET | Freq: Four times a day (QID) | ORAL | Status: DC | PRN

## 2015-12-31 MED ORDER — ACETAMINOPHEN 325 MG PO TABS: 650.0000 mg | ORAL_TABLET | ORAL | Status: DC | PRN

## 2015-12-31 NOTE — H&P (Signed)
History and Physical    Natasha Roach [id number] DOB: January 15, 1961 DOA: 12/31/2015   PCP: No PCP Per Patient Chief Complaint:  Chief Complaint  Patient presents with  . Chest Pain    HPI: Natasha Roach is a 55 y.o. female with medical history significant of "heart attack" in 2004 in setting of cocaine use.  Ongoing cocaine use last used crack earlier today she admits.  After using crack she developed gradual onset of central chest pain with radiation to back, nausea.  This onset several hours ago, was severe 10/10.  Patient called EMS.  Brought in initially as a code STEMI.  ED Course: By the time she got to ED, a repeat EKG at 1819 showed inferior lateral T wave inversion but no STEMI.  And 17 mins later at 1833 another repeat EKG looked even better with improvement in T waves in inferior lateral leads.  She also donated plasma this morning.  Review of Systems: Small amount of blood in BM yesterday.  As per HPI otherwise 10 point review of systems negative.    Past Medical History:  Diagnosis Date  . 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 Substance abuse     Past Surgical History:  Procedure Laterality Date  . ABDOMINAL HYSTERECTOMY    . BREAST SURGERY     abcess  . CARDIAC CATHETERIZATION  2004     reports that she has been smoking Cigarettes.  She has a 40.00 pack-year smoking history. She has never used smokeless tobacco. She reports that she uses drugs, including Cocaine. She reports that she does not drink alcohol.  No Known Allergies  No family history on file. No early onset CAD   Prior to Admission medications   Medication Sig Start Date End Date Taking? Authorizing Provider  cyclobenzaprine (FLEXERIL) 10 MG tablet Take 0.5-1 tablets (5-10 mg total) by mouth 2 (two) times daily as needed. 12/26/15   Marlon Peliffany Greene, PA-C  HYDROcodone-acetaminophen (NORCO/VICODIN) 5-325 MG tablet Take 1 tablet by mouth every 6 (six) hours as  needed for severe pain. 12/26/15   Tiffany Neva SeatGreene, PA-C  ibuprofen (ADVIL,MOTRIN) 200 MG tablet Take 600 mg by mouth every 6 (six) hours as needed for moderate pain.    Historical Provider, MD  predniSONE (DELTASONE) 10 MG tablet Take 2 tablets (20 mg total) by mouth daily. 12/26/15   Marlon Peliffany Greene, PA-C    Physical Exam: Vitals:   12/31/15 1815 12/31/15 1845 12/31/15 1900 12/31/15 2000  BP: 119/72 124/72 129/70 132/76  Pulse: 70 (!) 59 63 65  Resp: 20 18 22 23   Temp:      TempSrc:      SpO2: 98% 97% 100% 96%  Weight:      Height:          Constitutional: NAD, calm, comfortable Eyes: PERRL, lids and conjunctivae normal ENMT: Mucous membranes are moist. Posterior pharynx clear of any exudate or lesions.Normal dentition.  Neck: normal, supple, no masses, no thyromegaly Respiratory: clear to auscultation bilaterally, no wheezing, no crackles. Normal respiratory effort. No accessory muscle use.  Cardiovascular: Regular rate and rhythm, no murmurs / rubs / gallops. No extremity edema. 2+ pedal pulses. No carotid bruits.  Abdomen: no tenderness, no masses palpated. No hepatosplenomegaly. Bowel sounds positive.  Musculoskeletal: no clubbing / cyanosis. No joint deformity upper and lower extremities. Good ROM, no contractures. Normal muscle tone.  Skin: no rashes, lesions, ulcers. No induration Neurologic: CN 2-12 grossly intact. Sensation intact, DTR  normal. Strength 5/5 in all 4.  Psychiatric: Normal judgment and insight. Alert and oriented x 3. Normal mood.    Labs on Admission: I have personally reviewed following labs and imaging studies  CBC:  Recent Labs Lab 12/31/15 1800  WBC 26.8*  NEUTROABS 19.3*  HGB 15.4*  HCT 48.1*  MCV 85.7  PLT 193   Basic Metabolic Panel:  Recent Labs Lab 12/31/15 1800  NA 136  K 4.4  CL 105  CO2 21*  GLUCOSE 195*  BUN 14  CREATININE 0.91  CALCIUM 8.9   GFR: Estimated Creatinine Clearance: 82.1 mL/min (by C-G formula based on SCr of  0.91 mg/dL). Liver Function Tests: No results for input(s): AST, ALT, ALKPHOS, BILITOT, PROT, ALBUMIN in the last 168 hours. No results for input(s): LIPASE, AMYLASE in the last 168 hours. No results for input(s): AMMONIA in the last 168 hours. Coagulation Profile: No results for input(s): INR, PROTIME in the last 168 hours. Cardiac Enzymes: No results for input(s): CKTOTAL, CKMB, CKMBINDEX, TROPONINI in the last 168 hours. BNP (last 3 results) No results for input(s): PROBNP in the last 8760 hours. HbA1C: No results for input(s): HGBA1C in the last 72 hours. CBG: No results for input(s): GLUCAP in the last 168 hours. Lipid Profile: No results for input(s): CHOL, HDL, LDLCALC, TRIG, CHOLHDL, LDLDIRECT in the last 72 hours. Thyroid Function Tests: No results for input(s): TSH, T4TOTAL, FREET4, T3FREE, THYROIDAB in the last 72 hours. Anemia Panel: No results for input(s): VITAMINB12, FOLATE, FERRITIN, TIBC, IRON, RETICCTPCT in the last 72 hours. Urine analysis:  Sepsis Labs: @LABRCNTIP (procalcitonin:4,lacticidven:4) )No results found for this or any previous visit (from the past 240 hour(s)).   Radiological Exams on Admission: Dg Chest 2 View  Result Date: 12/31/2015 CLINICAL DATA:  Mid chest pain or bad cough for 9 months, smoker, history MI EXAM: CHEST  2 VIEW COMPARISON:  09/07/2012 FINDINGS: Normal heart size, mediastinal contours, and pulmonary vascularity. Lungs clear. No pleural effusion or pneumothorax. Bones unremarkable. IMPRESSION: No acute abnormalities. Electronically Signed   By: Ulyses Southward M.D.   On: 12/31/2015 19:53    EKG: Independently reviewed.  Assessment/Plan Principal Problem:   Acute coronary syndrome (HCC) Active Problems:   Cocaine abuse    1. 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  Pain down to 1/10 after just 1 SL NTG 1. If pain reoccurs then put on NTG gtt 2. Serial trops 3. See cards consult note 4. A1C, lipid pnl  pending 5. NPO after midnight 2. Hypotension on arrival to ED - 1. Complete resolution with just 500 cc of NS and 1 SL NTG. 2. Despite Leukocytosis, I am somewhat doubtful that this represents full blown sepsis given complete resolution with BP now running 130s after just 500cc NS and 1 SL NTG.  More likely I suspect that hypotension was due to reduced cardiac function during the cocaine induced vasospasm episode which may have improved with improvement in vasospasm by giving SL NTG. 3. Leukocytosis -  1. UA pending 2. BCx pending 3. No other SIRS criteria at the moment so will hold off on empiric ABx for now 4. I agree that the degree of leukocytosis does seem a bit high for just prednisone de margination 5. Repeat CBC in AM 4. Subacute / chronic low back pain - 1. Prescribed vicodin, prednisone, and flexeril by EDP last week 5. Hematochezia - 1. Only scant amount, and doubt major GI bleed, HGB is actually elevated at 15.4 despite the episode  of BRBPR yesterday 6. Cocaine abuse   DVT prophylaxis: Heparin gtt Code Status: Full Family Communication: No family in room Consults called: Cards has seen patient Admission status: Admit to inpatient   Hillary Bow DO [facility] Pager [phone] from 7PM-7AM  If 7AM-7PM, please contact the day physician for the patient [email or web address] Password [facility]  12/31/2015, 8:23 PM

## 2015-12-31 NOTE — Progress Notes (Signed)
ANTICOAGULATION CONSULT NOTE - Initial Consult  Pharmacy Consult for heparin Indication: chest pain/ACS  No Known Allergies  Patient Measurements: Height: 5\' 7"  (170.2 cm) Weight: 202 lb (91.6 kg) IBW/kg (Calculated) : 61.6 Heparin Dosing Weight: 81 kg   Vital Signs: Temp: 97.6 F (36.4 C) (09/07 1758) Temp Source: Oral (09/07 1758) BP: 132/76 (09/07 2000) Pulse Rate: 65 (09/07 2000)  Labs:  Recent Labs  12/31/15 1800  HGB 15.4*  HCT 48.1*  PLT 193  CREATININE 0.91    Estimated Creatinine Clearance: 82.1 mL/min (by C-G formula based on SCr of 0.91 mg/dL).   Medical History: Past Medical History:  Diagnosis Date  . 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 Substance abuse    Assessment: 55 yo female with chest pain and associated nausea. No PTA oral anticoagulation per chart. Hgb 15.4 and platelets 193. Hematochezia reported per note but no frank blood reported on exam by physician. No other signs and symptoms of bleeding noted. Conservative heparin bolus ordered pending hemoccult for hematochezia.   Goal of Therapy:  Heparin level 0.3-0.7 units/ml Monitor platelets by anticoagulation protocol: Yes   Plan:  Heparin bolus 3000 units x1  Start heparin gtt at 1050 units/hr Heparin level in 6 hours Daily heparin level and CBC Monitor for signs and symptoms of bleeding  Follow-up hemoccult results   York CeriseKatherine Cook, PharmD Pharmacy Resident  Pager [phone] 12/31/15 8:15 PM

## 2015-12-31 NOTE — ED Provider Notes (Signed)
[facility] Provider Note   CSN: [id number] Arrival date & time: 12/31/15  1742     History   Chief Complaint Chief Complaint  Patient presents with  . Chest Pain   LEVEL 5 CAVEAT DUE TO ACUITY OF CONDITION  HPI Natasha Roach is a 55 y.o. female.  The history is provided by the patient.  Chest Pain   This is a new problem. Episode onset: several hrs ago. The problem occurs constantly. The problem has been gradually improving. Associated with: cocaine abuse. The pain is present in the substernal region. The quality of the pain is described as pressure-like. Radiates to: back. Associated symptoms include nausea.   PAITENT PRESENTS VIA EMS FOR CHEST PAIN INITIAL EKG SHOWED SIGNS OF ACUTE STEMI NO OTHER DETAILS ARE KNOWN ON ARRIVAL Past Medical History:  Diagnosis Date  . 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 Substance abuse     There are no active problems to display for this patient.   Past Surgical History:  Procedure Laterality Date  . ABDOMINAL HYSTERECTOMY    . BREAST SURGERY     abcess  . CARDIAC CATHETERIZATION  2004    OB History    No data available       Home Medications    Prior to Admission medications   Medication Sig Start Date End Date Taking? Authorizing Provider  cyclobenzaprine (FLEXERIL) 10 MG tablet Take 0.5-1 tablets (5-10 mg total) by mouth 2 (two) times daily as needed. 12/26/15   Marlon Peliffany Greene, PA-C  HYDROcodone-acetaminophen (NORCO/VICODIN) 5-325 MG tablet Take 1 tablet by mouth every 6 (six) hours as needed for severe pain. 12/26/15   Tiffany Neva SeatGreene, PA-C  ibuprofen (ADVIL,MOTRIN) 200 MG tablet Take 600 mg by mouth every 6 (six) hours as needed for moderate pain.    Historical Provider, MD  predniSONE (DELTASONE) 10 MG tablet Take 2 tablets (20 mg total) by mouth daily. 12/26/15   Marlon Peliffany Greene, PA-C    Family History No family history on file.  Social History Social History  Substance Use Topics   . Smoking status: Current Every Day Smoker    Packs/day: 1.00    Types: Cigarettes  . Smokeless tobacco: Never Used  . Alcohol use No     Allergies   Review of patient's allergies indicates no known allergies.   Review of Systems Review of Systems  Unable to perform ROS: Acuity of condition  Cardiovascular: Positive for chest pain.  Gastrointestinal: Positive for nausea.     Physical Exam Updated Vital Signs BP 119/72   Pulse 70   Temp 97.6 F (36.4 C) (Oral)   Resp 20   Ht 5\' 7"  (1.702 m)   Wt 91.6 kg   SpO2 98%   BMI 31.64 kg/m   Physical Exam CONSTITUTIONAL: Well developed, distress noted HEAD: Normocephalic/atraumatic EYES: EOMI ENMT: Mucous membranes moist NECK: supple no meningeal signs CV: S1/S2 noted, no murmurs/rubs/gallops noted LUNGS: Lungs are clear to auscultation bilaterally, no apparent distress ABDOMEN: soft, nontender NEURO: Pt is awake/alert/appropriate, moves all extremitiesx4.  EXTREMITIES: pulses normal/equal, full ROM SKIN: warm, color normal PSYCH: anxious   ED Treatments / Results  Labs (all labs ordered are listed, but only abnormal results are displayed) Labs Reviewed  BASIC METABOLIC PANEL - Abnormal; Notable for the following:       Result Value   CO2 21 (*)    Glucose, Bld 195 (*)    All other components within normal limits  CBC - Abnormal; Notable for the following:    WBC 26.8 (*)    RBC 5.61 (*)    Hemoglobin 15.4 (*)    HCT 48.1 (*)    All other components within normal limits  DIFFERENTIAL - Abnormal; Notable for the following:    Neutro Abs 19.3 (*)    Lymphs Abs 5.0 (*)    Monocytes Absolute 1.2 (*)    All other components within normal limits  CBC WITH DIFFERENTIAL/PLATELET  Rosezena Sensor, ED    EKG  EKG Interpretation  Date/Time:  Thursday December 31 2015 17:47:44 EDT Ventricular Rate:  62 PR Interval:    QRS Duration: 77 QT Interval:  403 QTC Calculation: 410 R Axis:   142 Text  Interpretation:  Sinus or ectopic atrial rhythm Anterolateral infarct, age indeterminate Abnormal T, consider ischemia, lateral leads Baseline wander in lead(s) V1 V2 V4 Abnormal ekg changed from prior Confirmed by Bebe Shaggy  MD, Marlon Vonruden ([id number]) on 12/31/2015 6:18:02 PM       Radiology Dg Chest 2 View  Result Date: 12/31/2015 CLINICAL DATA:  Mid chest pain or bad cough for 9 months, smoker, history MI EXAM: CHEST  2 VIEW COMPARISON:  09/07/2012 FINDINGS: Normal heart size, mediastinal contours, and pulmonary vascularity. Lungs clear. No pleural effusion or pneumothorax. Bones unremarkable. IMPRESSION: No acute abnormalities. Electronically Signed   By: Ulyses Southward M.D.   On: 12/31/2015 19:53    Procedures Procedures (including critical care time)  Medications Ordered in ED Medications  sodium chloride 0.9 % bolus 500 mL (0 mLs Intravenous Stopped 12/31/15 1837)     Initial Impression / Assessment and Plan / ED Course  I have reviewed the triage vital signs and the nursing notes.  Pertinent labs & imaging results that were available during my care of the patient were reviewed by me and considered in my medical decision making (see chart for details).  Clinical Course    Pt seen on arrival with cardiology team (dr eno) We reviewed pre-hospital EKGs as well as current EKG After discussion, cardiology decided to CANCEL code stemi Will continue workup  Pt reports giving plasma today She also admits to cocaine use She described chest pressure It is now improving She is awake/alert, and appears comfortable Workup pending  8:02 PM Pt stable Cardiology to admit  Final Clinical Impressions(s) / ED Diagnoses   Final diagnoses:  Chest pain, rule out acute myocardial infarction    New Prescriptions New Prescriptions   No medications on file     Zadie Rhine, MD 12/31/15 2002

## 2015-12-31 NOTE — ED Notes (Signed)
Per Cardiology STEMI cancelled.

## 2015-12-31 NOTE — ED Notes (Signed)
Witnessed rectal exam performed by Dr. Okey DupreEnd

## 2015-12-31 NOTE — ED Triage Notes (Signed)
Per [facility]: Pt started having chest pain today around 2:30 pm. Pt states that her pain is an 8/10, describes the pain as "pressure". Pt stated that the pain radiated to her back, and was nauseated. Pt was given 324 ASA and 1 nitro with EMS. Pt stated that the nitro did not decrease her pain. EMS states that they noted ST elevation in leads 2, 3, and AVF. Pt has hx of MI and substance abuse. Pt stated that she last used crack today. Pt stated that she also donated plasma today around 11 am.

## 2015-12-31 NOTE — ED Notes (Signed)
Dr. Wickline at bedside.  

## 2015-12-31 NOTE — ED Notes (Signed)
Patient transported to X-ray 

## 2015-12-31 NOTE — H&P (Signed)
Cardiology Consultation    Patient ID: Natasha Roach MRN: [id number], DOB: 10/01/1960 Date of Encounter: 12/31/2015, 7:10 PM Primary Physician: No PCP Per Patient Primary Cardiologist: None  Chief Complaint: Chest and back pain Reason for Admission: Chest pain Requesting MD: ED  HPI: Ms. Natasha Roach is a 55 y/o woman with history of "heart attack" in 2004 in the setting of cocaine use, who presents to the ED via EMS as a "Code STEMI."  The patient reports gradual onset of central chest pain radiating to the back with accompanying nausea several hours ago.  The pain was severe (10/10), prompting her to summon EMS.  EMS EKG's were concerning for subtle inferior ST segment elevation.  She received NTG x 1 and ASA with improvement in the pain.  Currently, she describes vague pressure in the center of her chest (1/10), as well as her chronic low back pain and sciatica.  She endorses mild nausea but denies shortness of breath.  She notes having chills around noon today, about the time that her chest pain and nausea began.  The patient had a runny nose last week, but denies other URI symptoms, abdominal pain, diarrhea, and dysuria.  The patient reports using cocaine this morning, after which she also donated plasma.  She reports having donated plasma several times in the past and never had symptoms like what she experienced today.  She is concerned that her drugs may have been laced with something in an attempt to harm her.  Past Medical History:  Diagnosis Date  . 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 Substance abuse      Surgical History:  Past Surgical History:  Procedure Laterality Date  . ABDOMINAL HYSTERECTOMY    . BREAST SURGERY     abcess  . CARDIAC CATHETERIZATION  2004     Home Meds: Prior to Admission medications   Medication Sig Start Date Chania Kochanski Date Taking? Authorizing Provider  cyclobenzaprine (FLEXERIL) 10 MG tablet Take 0.5-1 tablets (5-10  mg total) by mouth 2 (two) times daily as needed. 12/26/15   Marlon Peliffany Greene, PA-C  HYDROcodone-acetaminophen (NORCO/VICODIN) 5-325 MG tablet Take 1 tablet by mouth every 6 (six) hours as needed for severe pain. 12/26/15   Tiffany Neva SeatGreene, PA-C  ibuprofen (ADVIL,MOTRIN) 200 MG tablet Take 600 mg by mouth every 6 (six) hours as needed for moderate pain.    Historical Provider, MD  predniSONE (DELTASONE) 10 MG tablet Take 2 tablets (20 mg total) by mouth daily. 12/26/15   Marlon Peliffany Greene, PA-C    Allergies: No Known Allergies  Social History   Social History  . Marital status: Significant Other    Spouse name: N/A  . Number of children: N/A  . Years of education: N/A   Occupational History  . Not on file.   Social History Main Topics  . Smoking status: Current Every Day Smoker    Packs/day: 1.00    Years: 40.00    Types: Cigarettes  . Smokeless tobacco: Never Used  . Alcohol use No  . Drug use:     Types: Cocaine     Comment: last use 12/31/15  . Sexual activity: Yes   Other Topics Concern  . Not on file   Social History Narrative  . No narrative on file     No family history on file.  Review of Systems: Patient reports small amount of rectal bleeding with bowel movement yesterday, which is new for her.  She also  reports a non-productive cough for ~8 months.  Otherwise, a 12-system review of systems was performed and is negative except as noted in the HPI.  Labs:   Lab Results  Component Value Date   WBC 26.8 (H) 12/31/2015   HGB 15.4 (H) 12/31/2015   HCT 48.1 (H) 12/31/2015   MCV 85.7 12/31/2015   PLT 193 12/31/2015     Recent Labs Lab 12/31/15 1800  NA 136  K 4.4  CL 105  CO2 21*  BUN 14  CREATININE 0.91  CALCIUM 8.9  GLUCOSE 195*   POC Troponin: 0.01  Radiology/Studies:  CXR Pending  Wt Readings from Last 3 Encounters:  12/31/15 91.6 kg (202 lb)  09/07/12 96.2 kg (212 lb)    EKG: Normal sinus rhythm with non-specific ST segment changes.  No evidence  of STEMI.  Physical Exam: Blood pressure 129/70, pulse 63, temperature 97.6 F (36.4 C), temperature source Oral, resp. rate 22, height 5\' 7"  (1.702 m), weight 91.6 kg (202 lb), SpO2 100 %. Body mass index is 31.64 kg/m. General: Obese woman lying in bed.  She appears anxious. Head: Normocephalic, atraumatic, sclera non-icteric, no xanthomas, nares are without discharge.  Neck: Negative for carotid bruits. JVD not elevated. Lungs: Clear bilaterally to auscultation without wheezes, rales, or rhonchi. Breathing is unlabored. Heart: RRR with S1 S2. No murmurs, rubs, or gallops appreciated. Abdomen: Soft, non-tender, non-distended with normoactive bowel sounds. No hepatomegaly. No rebound/guarding. No obvious abdominal masses. Rectum: Normal tone.  No masses.  Small amount of tan stool.  Sample sent for hemoccult. (performed with pt's RN as chaperone) Msk:  Strength and tone appear normal for age. Extremities: No clubbing or cyanosis. No edema.  Right knee is mildly swollen compared to left, which patient reports is chronic.  Distal pedal pulses are 2+ and equal bilaterally. Neuro: Alert and oriented X 3. No focal deficit. No facial asymmetry. Moves all extremities spontaneously. Psych:  Responds to questions appropriately.    Assessment and Plan  55 y/o woman with history of remote MI in the setting of cocaine use, who present as possible Code STEMI with progressive chest pain this afternoon following cocaine use.  Chest pain: EMS EKG's show sublte inferior ST segment elevations that do not meet STEMI criteria.  EKG here reveals right axis shift and inferior and anterolateral T-wave inversions but no ST-elevation.  He pain has almost completely resolved (currently 1/10) after 1 sublingual NTG.  Pain could certainly be cardiac in nature, given history of remote MI and recent cocaine use. Vasospasm would be a consideration. Will not take for emergent cath at this time, given improved pain and EKG's  without ST-segment elevation.  Admit to stepdown with serial troponins.  Initiate heparin infusion per pharmacy (ACS nomogram) and low-dose ASA.  Check urine drug screen, fasting lipid panel, and hemoglobin A1c.  Initiate statin and prn NTG.  If chest pain does not continue to resolve or worsens, may benefit from NTG infusion.  Avoid beta-blockers, if possible, in light of recurrent cocaine abuse.  Will make NPO after midnight in anticipation of ischemia evaluation tomorrow based on clinical course and troponin trend.  Hypotension: Patient reportedly hypertensive with EMS but found to have borderline low BP upon arrival here.  Hypotension resolved with 500 mL NS bolus.  May be due to volume depletion from plasma donation earlier today.  However, in light of leukocytosis, sepsis is a concern.  No obvious source of infection.  Follow-up CXR.  Consider blood cx, UA, and  empiric antibiotics, per hospitalist service.  Obtain transthoracic echo.  Leukocytosis:  Most likely etiologies would be pulmonary infection, given cough and chest pain, or response to recent corticosteroid use.  Degree of leukocytosis seem a little high for prednisone alone.  Chills earlier today also could indicate infectious process.  Follow-up CXR.  Differential added to CBC in lab.  Consider blood cx, UA, and empiric antibiotics, per hospitalist service.  Back pain: This has been a subacute/chronic issue for the patient.  She was seen in the [facility] ED within the last week for this and was given prednisone, Flexeril, and Vicodin.  Low suspicion for aortic dissection given chronicity of pain and equal BP in both arms.  Follow-up CXR  Continue symptomatic treatment for low-back pain.  Hematochezia: Pt reports scant bowel movement yesterday and last week.  Nauseated today but no abdominal pain or tenderness.  Follow-up CBC.  Await hemoccult; in light of no frank blood, will proceed with heparin  infusion.  Substance abuse: Patient has ongoing tobacco and cocaine use.  Substance abuse counseling provided; patient encouraged to quit.  Zenovia Jordan Cherylyn Sundby MD 12/31/2015, 7:10 PM Pager: [phone]

## 2016-01-01 ENCOUNTER — Encounter (HOSPITAL_COMMUNITY): Payer: Self-pay | Admitting: *Deleted

## 2016-01-01 ENCOUNTER — Inpatient Hospital Stay (HOSPITAL_COMMUNITY): Payer: Self-pay

## 2016-01-01 DIAGNOSIS — R079 Chest pain, unspecified: Secondary | ICD-10-CM

## 2016-01-01 DIAGNOSIS — I249 Acute ischemic heart disease, unspecified: Secondary | ICD-10-CM

## 2016-01-01 LAB — BASIC METABOLIC PANEL
ANION GAP: 9 (ref 5–15)
BUN: 11 mg/dL (ref 6–20)
CALCIUM: 8.7 mg/dL — AB (ref 8.9–10.3)
CO2: 24 mmol/L (ref 22–32)
CREATININE: 0.72 mg/dL (ref 0.44–1.00)
Chloride: 106 mmol/L (ref 101–111)
Glucose, Bld: 150 mg/dL — ABNORMAL HIGH (ref 65–99)
Potassium: 4.4 mmol/L (ref 3.5–5.1)
SODIUM: 139 mmol/L (ref 135–145)

## 2016-01-01 LAB — ECHOCARDIOGRAM COMPLETE
E decel time: 257 msec
EERAT: 5.96
FS: 27 % — AB (ref 28–44)
HEIGHTINCHES: 67.008 in
IVS/LV PW RATIO, ED: 0.92
LA ID, A-P, ES: 32 mm
LA diam end sys: 32 mm
LA diam index: 1.57 cm/m2
LA vol A4C: 32.6 ml
LA vol: 39.4 mL
LAVOLIN: 19.3 mL/m2
LDCA: 3.14 cm2
LV E/e' medial: 5.96
LV TDI E'LATERAL: 12.7
LV e' LATERAL: 12.7 cm/s
LVEEAVG: 5.96
LVOTD: 20 mm
MV Dec: 257
MV Peak grad: 2 mmHg
MV pk A vel: 75.2 m/s
MVPKEVEL: 75.7 m/s
PW: 13.1 mm — AB (ref 0.6–1.1)
RV TAPSE: 21.2 mm
TDI e' medial: 6.85
WEIGHTICAEL: 3248.7 [oz_av]

## 2016-01-01 LAB — CBC
HEMATOCRIT: 44 % (ref 36.0–46.0)
HEMOGLOBIN: 14 g/dL (ref 12.0–15.0)
MCH: 27.5 pg (ref 26.0–34.0)
MCHC: 31.8 g/dL (ref 30.0–36.0)
MCV: 86.3 fL (ref 78.0–100.0)
Platelets: 177 10*3/uL (ref 150–400)
RBC: 5.1 MIL/uL (ref 3.87–5.11)
RDW: 12.8 % (ref 11.5–15.5)
WBC: 19.9 10*3/uL — ABNORMAL HIGH (ref 4.0–10.5)

## 2016-01-01 LAB — LIPID PANEL
Cholesterol: 171 mg/dL (ref 0–200)
HDL: 36 mg/dL — ABNORMAL LOW (ref >40)
LDL CALC: UNDETERMINED mg/dL (ref 0–99)
TRIGLYCERIDES: 858 mg/dL — AB (ref <150)
Total CHOL/HDL Ratio: 4.8 RATIO
VLDL: UNDETERMINED mg/dL (ref 0–40)

## 2016-01-01 LAB — HEPARIN LEVEL (UNFRACTIONATED)
HEPARIN UNFRACTIONATED: 0.48 [IU]/mL (ref 0.30–0.70)
Heparin Unfractionated: 0.35 IU/mL (ref 0.30–0.70)

## 2016-01-01 LAB — TROPONIN I
TROPONIN I: 0.79 ng/mL — AB (ref <0.03)
TROPONIN I: 1.72 ng/mL — AB (ref <0.03)

## 2016-01-01 LAB — OCCULT BLOOD, POC DEVICE: Fecal Occult Bld: NEGATIVE

## 2016-01-01 MED ORDER — DEXTROSE 5 % IV SOLN
1.0000 g | INTRAVENOUS | Status: DC
  Administered 2016-01-01: 1 g via INTRAVENOUS
  Filled 2016-01-01 (×2): qty 10

## 2016-01-01 MED ORDER — METRONIDAZOLE 500 MG PO TABS
500.0000 mg | ORAL_TABLET | Freq: Three times a day (TID) | ORAL | Status: DC
  Administered 2016-01-01 – 2016-01-02 (×3): 500 mg via ORAL
  Filled 2016-01-01 (×3): qty 1

## 2016-01-01 MED ORDER — AMLODIPINE BESYLATE 2.5 MG PO TABS
2.5000 mg | ORAL_TABLET | Freq: Every day | ORAL | Status: DC
  Administered 2016-01-01 – 2016-01-02 (×2): 2.5 mg via ORAL
  Filled 2016-01-01 (×2): qty 1

## 2016-01-01 MED ORDER — FLUCONAZOLE 150 MG PO TABS
150.0000 mg | ORAL_TABLET | Freq: Once | ORAL | Status: AC
  Administered 2016-01-01: 150 mg via ORAL
  Filled 2016-01-01: qty 1

## 2016-01-01 NOTE — Progress Notes (Signed)
Patient Name: Natasha Roach Date of Encounter: 01/01/2016  Primary Cardiologist: Wallace Cullens. End, MD  Hospital Problem List     Principal Problem:   Acute coronary syndrome [facility](HCC) Active Problems:   Cocaine abuse   Leukocytosis   Morbid obesity (HCC)   Subjective   She did have mild chest discomfort at one point overnight.  Currently chest pain free but says that every once in awhile she has a brief twinge of left upper chest pain that is worse with palpation and/or position changes.  She states clearly that she is not interested in cath.  Inpatient Medications    . aspirin EC  81 mg Oral Daily  . atorvastatin  80 mg Oral q1800    Vital Signs    Vitals:   01/01/16 0300 01/01/16 0500 01/01/16 0750 01/01/16 1212  BP: (!) 119/98  135/80 120/72  Pulse: 64  66 (!) 58  Resp: 19  17 16   Temp: 98.7 F (37.1 C)  98.2 F (36.8 C) 98.7 F (37.1 C)  TempSrc: Oral  Oral Oral  SpO2: 95%  98% 96%  Weight:  203 lb 0.7 oz (92.1 kg)    Height:        Intake/Output Summary (Last 24 hours) at 01/01/16 1251 Last data filed at 01/01/16 0837  Gross per 24 hour  Intake              500 ml  Output             1800 ml  Net            -1300 ml   Filed Weights   12/31/15 1805 12/31/15 2036 01/01/16 0500  Weight: 202 lb (91.6 kg) 203 lb 8 oz (92.3 kg) 203 lb 0.7 oz (92.1 kg)    Physical Exam   GEN: Well nourished, well developed, in no acute distress.  HEENT: Grossly normal.  Neck: Supple, no JVD, carotid bruits, or masses. Cardiac: RRR, no murmurs, rubs, or gallops. No clubbing, cyanosis, edema.  Radials/DP/PT 2+ and equal bilaterally.  Respiratory:  Respirations regular and unlabored, clear to auscultation bilaterally. GI: Soft, nontender, nondistended, BS + x 4. MS: no deformity or atrophy. Skin: warm and dry, no rash. Neuro:  Strength and sensation are intact. Psych: AAOx3.  Normal affect.  Labs    CBC  Recent Labs  12/31/15 1800 01/01/16 0220  WBC 26.8* 19.9*    NEUTROABS 19.3*  --   HGB 15.4* 14.0  HCT 48.1* 44.0  MCV 85.7 86.3  PLT 193 177   Basic Metabolic Panel  Recent Labs  12/31/15 1800 12/31/15 2033 01/01/16 0754  NA 136  --  139  K 4.4  --  4.4  CL 105  --  106  CO2 21*  --  24  GLUCOSE 195*  --  150*  BUN 14  --  11  CREATININE 0.91  --  0.72  CALCIUM 8.9  --  8.7*  MG  --  2.3  --    Liver Function Tests  Recent Labs  12/31/15 2041  AST 19  ALT 18  ALKPHOS 96  BILITOT 0.5  PROT 5.2*  ALBUMIN 3.4*   Cardiac Enzymes  Recent Labs  12/31/15 2033 01/01/16 0220 01/01/16 0754  TROPONINI 0.05* 0.79* 1.72*   Fasting Lipid Panel  Recent Labs  01/01/16 0220  CHOL 171  HDL 36*  LDLCALC UNABLE TO CALCULATE IF TRIGLYCERIDE OVER 400 mg/dL  TRIG 409858*  CHOLHDL 4.8  Thyroid Function Tests  Recent Labs  12/31/15 2033  TSH 0.591    Telemetry    rsr  ECG    SB, 59, no acute st/t changes.  Radiology    Dg Chest 2 View  Result Date: 12/31/2015 CLINICAL DATA:  Mid chest pain or bad cough for 9 months, smoker, history MI EXAM: CHEST  2 VIEW COMPARISON:  09/07/2012 FINDINGS: Normal heart size, mediastinal contours, and pulmonary vascularity. Lungs clear. No pleural effusion or pneumothorax. Bones unremarkable. IMPRESSION: No acute abnormalities. Electronically Signed   By: Ulyses Southward M.D.   On: 12/31/2015 19:53   Patient Profile     55 y/o ? with a h/o crack cocaine abuse that presented to the [facility] ED on 9/7, following using crack cocaine earlier in the day and developing c/p.    Assessment & Plan    1.  Acute Coronary Syndrome:  Pt presented 9/7 with chest pain in the setting of crack cocaine usage.  ECG on arrival was notable for inferior and antlat TWI.  This has since resolved.  Troponin has risen to 1.79.  Though this may be related to coronary vasospasm, we have recommended diagnostic catheterization for clarification.  She is not willing to proceed with diagnostic cath.  An echo was performed this  AM - read pending.  We will review.  Will not perform stress testing since she would not want cath if abnl.  Will instead plan med Rx with asa and statin.  Will add low dose amlodipine for presumed coronary vasospasm.  No  blocker in setting of crack usage.  2.  Crack cocaine abuse: she says that she has a 20 yr h/o cocaine usage.  She quit for 9 mos but started up again 2 mos ago.  She is convinced that she was given a bad batch on 9/7, leading to c/p and admission.  Complete cessation advised.  I will ask SW to see - ? Resources in the community.   3.  HTG:  TG 858.  She is currently on statin but if this value is accurate, I would switch to a fibrate.  Repeat in am.  4.  Hyperglycemia: F/u A1c.  Signed, Nicolasa Ducking NP 01/01/2016, 12:51 PM    I have seen, examined and evaluated the patient this PM along with Mr. Brion Aliment, NP.  After reviewing all the available data and chart, we discussed the patients laboratory, study & physical findings as well as symptoms in detail. I agree with his findings, examination as well as impression recommendations as per our discussion.    No further chest pain with minimal troponin elevation. She deathly had transient ischemic EKG changes in the setting of her chest pain yesterday that has now resolved along with her chest pain. She clearly indicates that she did not want cardiac catheterization which is reasonable. She does have an echocardiogram pending and as long as that is normal, there is no pressing requirement for cardiac catheterization.  She was counseled about crack cocaine abuse, and truly admits to being "addict". She needs full counseling and potential resources to assist with this. Since she is not really extended and crack catheterization this point, I agree that doing a stress test is probably not reasonable. Quite likely this was a prolonged coronary spasm and therefore would consider low-dose amlodipine plus minus Imdur.   Bryan Lemma,  M.D., M.S. Interventional Cardiologist   Pager # [phone] Phone # [phone] [address] [place], [place] [address]

## 2016-01-01 NOTE — Hospital Discharge Follow-Up (Signed)
This Case Manager spoke with Tomi BambergerBrenda Graves-Bigelow, RN who indicated patient needing a hospital follow-up appointment. Patient is uninsured and does not have a PCP. Hospital follow-up appointment scheduled for 01/05/16 at 1000 with Scot Juniffany Noel[facility]. Appointment on AVS.  Tomi BambergerBrenda Graves-Bigelow, RN CM updated. She indicated she will inform patient of scheduled appointment.

## 2016-01-01 NOTE — Progress Notes (Signed)
PROGRESS NOTE  Natasha RoyaltyMary A Roach [id number] DOB: March 01, 1961 DOA: 12/31/2015 PCP: No PCP Per Patient  HPI/Recap of past 24 hours:  Feeling better, currently denies chest pain, very regretful about her cocaine use  Assessment/Plan: Principal Problem:   Acute coronary syndrome (HCC) Active Problems:   Cocaine abuse   Leukocytosis   Morbid obesity (HCC)   1. 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  Pain down to 1/10 after just 1 SL NTG 1. A1C pending, lipid pnl with significantly elevated triglyceride, will repeat lipid panel in am 2. Cardiology consulted, patient declined cardiac cath 3. Echo pending 4. Cardiology started asa/statin/low dose norvasc+/-imdur for vasospasm, avoid betablocker in cocaine use 2. Hypotension on arrival to ED - 1. Complete resolution with just 500 cc of NS and 1 SL NTG.  3. Leukocytosis -  1. UA + uti +trichomonas, + yeast, she report abnormal vaginal discharge 2. BCx pending 3. Start rocephin, flagyl, diflucan x1 dose  4. Subacute / chronic low back pain - 1. Prescribed vicodin, prednisone, and flexeril by EDP last week  5. Hematochezia - 1. Only scant amount, and doubt major GI bleed, HGB is actually elevated at 15.4 despite the episode of BRBPR yesterday 2. From hemorrhoids? hgb stable  6. Cocaine abuse, cessation education provided   DVT prophylaxis: Heparin gtt Code Status: Full Family Communication: No family in room Consults called: Cards    Procedures:  none  Antibiotics:  none   Objective: BP 120/72 (BP Location: Right Arm)   Pulse (!) 58   Temp 98.7 F (37.1 C) (Oral)   Resp 16   Ht 5' 7.01" (1.702 m)   Wt 92.1 kg (203 lb 0.7 oz)   SpO2 96%   BMI 31.79 kg/m   Intake/Output Summary (Last 24 hours) at 01/01/16 1354 Last data filed at 01/01/16 0837  Gross per 24 hour  Intake              500 ml  Output             1800 ml  Net            -1300 ml   Filed Weights   12/31/15 1805 12/31/15  2036 01/01/16 0500  Weight: 91.6 kg (202 lb) 92.3 kg (203 lb 8 oz) 92.1 kg (203 lb 0.7 oz)    Exam:   General:  NAD  Cardiovascular: RRR  Respiratory: CTABL  Abdomen: Soft/ND/NT, positive BS  Musculoskeletal: No Edema  Neuro: aaox3  Data Reviewed: Basic Metabolic Panel:  Recent Labs Lab 12/31/15 1800 12/31/15 2033 01/01/16 0754  NA 136  --  139  K 4.4  --  4.4  CL 105  --  106  CO2 21*  --  24  GLUCOSE 195*  --  150*  BUN 14  --  11  CREATININE 0.91  --  0.72  CALCIUM 8.9  --  8.7*  MG  --  2.3  --    Liver Function Tests:  Recent Labs Lab 12/31/15 2041  AST 19  ALT 18  ALKPHOS 96  BILITOT 0.5  PROT 5.2*  ALBUMIN 3.4*   No results for input(s): LIPASE, AMYLASE in the last 168 hours. No results for input(s): AMMONIA in the last 168 hours. CBC:  Recent Labs Lab 12/31/15 1800 01/01/16 0220  WBC 26.8* 19.9*  NEUTROABS 19.3*  --   HGB 15.4* 14.0  HCT 48.1* 44.0  MCV 85.7 86.3  PLT 193 177   Cardiac Enzymes:  Recent Labs Lab 12/31/15 2033 01/01/16 0220 01/01/16 0754  TROPONINI 0.05* 0.79* 1.72*   BNP (last 3 results) No results for input(s): BNP in the last 8760 hours.  ProBNP (last 3 results) No results for input(s): PROBNP in the last 8760 hours.  CBG: No results for input(s): GLUCAP in the last 168 hours.  Recent Results (from the past 240 hour(s))  MRSA PCR Screening     Status: None   Collection Time: 12/31/15  9:15 PM  Result Value Ref Range Status   MRSA by PCR NEGATIVE NEGATIVE Final    Comment:        The GeneXpert MRSA Assay (FDA approved for NASAL specimens only), is one component of a comprehensive MRSA colonization surveillance program. It is not intended to diagnose MRSA infection nor to guide or monitor treatment for MRSA infections.   Culture, blood (routine x 2)     Status: None (Preliminary result)   Collection Time: 12/31/15 10:28 PM  Result Value Ref Range Status   Specimen Description BLOOD RIGHT  ANTECUBITAL  Final   Special Requests BOTTLES DRAWN AEROBIC ONLY 9CC  Final   Culture NO GROWTH < 12 HOURS  Final   Report Status PENDING  Incomplete  Culture, blood (routine x 2)     Status: None (Preliminary result)   Collection Time: 12/31/15 10:36 PM  Result Value Ref Range Status   Specimen Description BLOOD RIGHT HAND  Final   Special Requests BOTTLES DRAWN AEROBIC AND ANAEROBIC 5CC EA  Final   Culture NO GROWTH < 12 HOURS  Final   Report Status PENDING  Incomplete     Studies: Dg Chest 2 View  Result Date: 12/31/2015 CLINICAL DATA:  Mid chest pain or bad cough for 9 months, smoker, history MI EXAM: CHEST  2 VIEW COMPARISON:  09/07/2012 FINDINGS: Normal heart size, mediastinal contours, and pulmonary vascularity. Lungs clear. No pleural effusion or pneumothorax. Bones unremarkable. IMPRESSION: No acute abnormalities. Electronically Signed   By: Ulyses Southward M.D.   On: 12/31/2015 19:53    Scheduled Meds: . amLODipine  2.5 mg Oral Daily  . aspirin EC  81 mg Oral Daily  . atorvastatin  80 mg Oral q1800    Continuous Infusions: . sodium chloride 75 mL/hr at 01/01/16 1025  . heparin 1,050 Units/hr (12/31/15 2100)     Time spent:  Sylar Voong MD, PhD  [facility] Pager [phone]. If 7PM-7AM, please contact night-coverage at [email or web address], password [facility] 01/01/2016, 1:54 PM  LOS: 1 day

## 2016-01-01 NOTE — Progress Notes (Signed)
ANTICOAGULATION CONSULT NOTE   Pharmacy Consult for heparin Indication: chest pain/ACS  No Known Allergies  Patient Measurements: Height: 5' 7.01" (170.2 cm) Weight: 203 lb 0.7 oz (92.1 kg) IBW/kg (Calculated) : 61.62 Heparin Dosing Weight: 81 kg   Vital Signs: Temp: 98.2 F (36.8 C) (09/08 0750) Temp Source: Oral (09/08 0750) BP: 135/80 (09/08 0750) Pulse Rate: 66 (09/08 0750)  Labs:  Recent Labs  12/31/15 1800 12/31/15 2033 12/31/15 2228 01/01/16 0220 01/01/16 0754  HGB 15.4*  --   --  14.0  --   HCT 48.1*  --   --  44.0  --   PLT 193  --   --  177  --   APTT  --   --  145*  --   --   LABPROT  --   --  14.3  --   --   INR  --   --  1.11  --   --   HEPARINUNFRC  --   --   --  0.35 0.48  CREATININE 0.91  --   --   --   --   TROPONINI  --  0.05*  --  0.79*  --     Estimated Creatinine Clearance: 82.3 mL/min (by C-G formula based on SCr of 0.91 mg/dL).   Medical History: Past Medical History:  Diagnosis Date  . 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 Substance abuse    Assessment: 55 yo female with chest pain and associated nausea (troponin trend up). No PTA oral anticoagulation per chart.Hematochezia reported per note but no frank blood reported on exam by physician (hemoccult pending)  -heparin level=  0.48, CBC stable  Goal of Therapy:  Heparin level 0.3-0.7 units/ml Monitor platelets by anticoagulation protocol: Yes   Plan:  Continue heparin gtt at 1050 units/hr Daily heparin level and CBC Follow-up hemoccult results   Harland Germanndrew Antion Andres, Pharm D 01/01/2016 8:50 AM

## 2016-01-01 NOTE — Progress Notes (Signed)
ANTICOAGULATION CONSULT NOTE - Follow Up Consult  Pharmacy Consult for heparin Indication: chest pain/ACS  Labs:  Recent Labs  12/31/15 1800 12/31/15 2033 12/31/15 2228 01/01/16 0220  HGB 15.4*  --   --  14.0  HCT 48.1*  --   --  44.0  PLT 193  --   --  177  APTT  --   --  145*  --   LABPROT  --   --  14.3  --   INR  --   --  1.11  --   HEPARINUNFRC  --   --   --  0.35  CREATININE 0.91  --   --   --   TROPONINI  --  0.05*  --   --     Assessment/Plan:  55yo female therapeutic on heparin with initial dosing for CP. Will continue gtt at current rate and confirm stable with additional level.   Vernard GamblesVeronda Sidi Roach, PharmD, BCPS  01/01/2016,3:07 AM

## 2016-01-01 NOTE — Care Management Note (Signed)
Case Management Note  Patient Details  Name: Natasha RoyaltyMary A Roach MRN: [id number] Date of Birth: 23-Jul-1960  Subjective/Objective: Pt presented for chest pain. Pt is without PCP and Insurance.                    Action/Plan: CM was able to speak with [facility] Liaison Peterson LombardJessica Beck in ref to hospital f/u and she was able to schedule an appointment. Appointment will be placed on AVS. Hopefully pt will be able to be d/c on generic medications. [facility] Tuesday [facility]-16. Pt will be able to utilize pharmacy then with cost ranging from $4-$10.00. No further needs at this time.   Expected Discharge Date:                  Expected Discharge Plan:  Home/Self Care  In-House Referral:  NA  Discharge planning Services  CM Consult, Follow-up appt scheduled, Indigent [facility], Medication Assistance  Post Acute Care Choice:  NA Choice offered to:  NA  DME Arranged:  N/A DME Agency:  NA  HH Arranged:  NA HH Agency:  NA  Status of Service:  Completed, signed off  If discussed at Long Length of Stay Meetings, dates discussed:    Additional Comments:  Gala LewandowskyGraves-Bigelow, Finnegan Gatta Kaye, RN 01/01/2016, 11:17 AM

## 2016-01-01 NOTE — Progress Notes (Signed)
*  PRELIMINARY RESULTS* Echocardiogram 2D Echocardiogram has been performed.  Jeryl Columbialliott, Jamel Dunton 01/01/2016, 11:46 AM

## 2016-01-02 LAB — BASIC METABOLIC PANEL
Anion gap: 9 (ref 5–15)
BUN: 9 mg/dL (ref 6–20)
CHLORIDE: 104 mmol/L (ref 101–111)
CO2: 24 mmol/L (ref 22–32)
CREATININE: 0.46 mg/dL (ref 0.44–1.00)
Calcium: 8.7 mg/dL — ABNORMAL LOW (ref 8.9–10.3)
GFR calc non Af Amer: >60 mL/min (ref >60)
GLUCOSE: 138 mg/dL — AB (ref 65–99)
Potassium: 4.4 mmol/L (ref 3.5–5.1)
Sodium: 137 mmol/L (ref 135–145)

## 2016-01-02 LAB — URINE CULTURE

## 2016-01-02 LAB — LIPID PANEL
Cholesterol: 232 mg/dL — ABNORMAL HIGH (ref 0–200)
LDL Cholesterol: UNDETERMINED mg/dL (ref 0–99)
Triglycerides: 2229 mg/dL — ABNORMAL HIGH (ref <150)
VLDL: UNDETERMINED mg/dL (ref 0–40)

## 2016-01-02 LAB — HEPARIN LEVEL (UNFRACTIONATED): Heparin Unfractionated: 0.39 IU/mL (ref 0.30–0.70)

## 2016-01-02 LAB — CBC
HEMATOCRIT: 42.4 % (ref 36.0–46.0)
Hemoglobin: 13.7 g/dL (ref 12.0–15.0)
MCH: 28 pg (ref 26.0–34.0)
MCHC: 32.3 g/dL (ref 30.0–36.0)
MCV: 86.5 fL (ref 78.0–100.0)
PLATELETS: 196 10*3/uL (ref 150–400)
RBC: 4.9 MIL/uL (ref 3.87–5.11)
RDW: 13.1 % (ref 11.5–15.5)
WBC: 14.7 10*3/uL — AB (ref 4.0–10.5)

## 2016-01-02 LAB — HEMOGLOBIN A1C
HEMOGLOBIN A1C: 7 % — AB (ref 4.8–5.6)
Mean Plasma Glucose: 154 mg/dL

## 2016-01-02 LAB — TROPONIN I: TROPONIN I: 0.65 ng/mL — AB (ref <0.03)

## 2016-01-02 LAB — HIV ANTIBODY (ROUTINE TESTING W REFLEX): HIV Screen 4th Generation wRfx: NONREACTIVE

## 2016-01-02 LAB — MAGNESIUM: Magnesium: 2.6 mg/dL — ABNORMAL HIGH (ref 1.7–2.4)

## 2016-01-02 MED ORDER — NITROGLYCERIN 0.4 MG SL SUBL: 0.4000 mg | SUBLINGUAL_TABLET | SUBLINGUAL | 0 refills | Status: DC | PRN

## 2016-01-02 MED ORDER — FLUCONAZOLE 150 MG PO TABS: 150.0000 mg | ORAL_TABLET | Freq: Every day | ORAL | 0 refills | Status: DC

## 2016-01-02 MED ORDER — CEPHALEXIN 500 MG PO CAPS: 500.0000 mg | ORAL_CAPSULE | Freq: Two times a day (BID) | ORAL | 0 refills | Status: DC

## 2016-01-02 MED ORDER — ATORVASTATIN CALCIUM 80 MG PO TABS: 80.0000 mg | ORAL_TABLET | Freq: Every day | ORAL | 0 refills | Status: DC

## 2016-01-02 MED ORDER — METFORMIN HCL 500 MG PO TABS
500.0000 mg | ORAL_TABLET | Freq: Two times a day (BID) | ORAL | 0 refills | Status: DC
Start: 2016-01-02 — End: 2016-04-23

## 2016-01-02 MED ORDER — ASPIRIN 81 MG PO TBEC: 81.0000 mg | DELAYED_RELEASE_TABLET | Freq: Every day | ORAL | 0 refills | Status: DC

## 2016-01-02 MED ORDER — METRONIDAZOLE 500 MG PO TABS: 500.0000 mg | ORAL_TABLET | Freq: Three times a day (TID) | ORAL | 0 refills | Status: DC

## 2016-01-02 MED ORDER — AMLODIPINE BESYLATE 2.5 MG PO TABS: 2.5000 mg | ORAL_TABLET | Freq: Every day | ORAL | 0 refills | Status: DC

## 2016-01-02 MED ORDER — FENOFIBRATE 145 MG PO TABS: 145.0000 mg | ORAL_TABLET | Freq: Every day | ORAL | 0 refills | Status: DC

## 2016-01-02 NOTE — Progress Notes (Signed)
Patient Name: Natasha Roach Date of Encounter: 01/02/2016  Primary Cardiologist: Wallace Cullens, MD  Hospital Problem List     Principal Problem:   Acute coronary syndrome [facility]) Active Problems:   Cocaine abuse   Leukocytosis   Morbid obesity (HCC)   Subjective    55 y.o. female with medical history significant of "heart attack" in 2004 in setting of cocaine use.  Ongoing cocaine use last used crack earlier today she admits.  After using crack she developed gradual onset of central chest pain with radiation to back, nausea.  This onset several hours ago, was severe 10/10.  Patient called EMS.  Brought in initially as a code STEMI    Inpatient Medications    . amLODipine  2.5 mg Oral Daily  . aspirin EC  81 mg Oral Daily  . atorvastatin  80 mg Oral q1800  . cefTRIAXone (ROCEPHIN)  IV  1 g Intravenous Q24H  . metroNIDAZOLE  500 mg Oral Q8H    Vital Signs    Vitals:   01/02/16 0032 01/02/16 0400 01/02/16 0758 01/02/16 1152  BP: 112/65 120/74 107/85 131/83  Pulse: 67 62 75 67  Resp: 19 19 18 15   Temp: 98.3 F (36.8 C) 97.8 F (36.6 C) 98.4 F (36.9 C) 98.3 F (36.8 C)  TempSrc:   Oral Oral  SpO2: 95% 93% 95% 99%  Weight:  204 lb 11.2 oz (92.9 kg)    Height:        Intake/Output Summary (Last 24 hours) at 01/02/16 1215 Last data filed at 01/02/16 0900  Gross per 24 hour  Intake          1833.65 ml  Output             3850 ml  Net         -2016.35 ml   Filed Weights   12/31/15 2036 01/01/16 0500 01/02/16 0400  Weight: 203 lb 8 oz (92.3 kg) 203 lb 0.7 oz (92.1 kg) 204 lb 11.2 oz (92.9 kg)    Physical Exam   GEN: Well nourished, well developed, in no acute distress.  HEENT: Grossly normal.  Neck: Supple, no JVD, carotid bruits, or masses. Cardiac: RRR, no murmurs, rubs, or gallops. No clubbing, cyanosis, edema.  Radials/DP/PT 2+ and equal bilaterally.  Respiratory:  Respirations regular and unlabored, clear to auscultation bilaterally. GI: Soft, nontender,  nondistended, BS + x 4. MS: no deformity or atrophy. Skin: warm and dry, no rash. Neuro:  Strength and sensation are intact. Psych: AAOx3.  Normal affect.  Labs    CBC  Recent Labs  12/31/15 1800 01/01/16 0220 01/02/16 0453  WBC 26.8* 19.9* 14.7*  NEUTROABS 19.3*  --   --   HGB 15.4* 14.0 13.7  HCT 48.1* 44.0 42.4  MCV 85.7 86.3 86.5  PLT 193 177 196   Basic Metabolic Panel  Recent Labs  12/31/15 2033 01/01/16 0754 01/02/16 0453  NA  --  139 137  K  --  4.4 4.4  CL  --  106 104  CO2  --  24 24  GLUCOSE  --  150* 138*  BUN  --  11 9  CREATININE  --  0.72 0.46  CALCIUM  --  8.7* 8.7*  MG 2.3  --  2.6*   Liver Function Tests  Recent Labs  12/31/15 2041  AST 19  ALT 18  ALKPHOS 96  BILITOT 0.5  PROT 5.2*  ALBUMIN 3.4*   Cardiac Enzymes  Recent  Labs  01/01/16 0220 01/01/16 0754 01/02/16 0453  TROPONINI 0.79* 1.72* 0.65*   Fasting Lipid Panel  Recent Labs  01/02/16 0453  CHOL 232*  HDL NOT REPORTED DUE TO HIGH TRIGLYCERIDES  LDLCALC UNABLE TO CALCULATE IF TRIGLYCERIDE OVER 400 mg/dL  TRIG 9,1472,229*  CHOLHDL NOT REPORTED DUE TO HIGH TRIGLYCERIDES   Thyroid Function Tests  Recent Labs  12/31/15 2033  TSH 0.591    Telemetry    rsr  ECG    SB, 59, no acute st/t changes.  Radiology    Dg Chest 2 View  Result Date: 12/31/2015 CLINICAL DATA:  Mid chest pain or bad cough for 9 months, smoker, history MI EXAM: CHEST  2 VIEW COMPARISON:  09/07/2012 FINDINGS: Normal heart size, mediastinal contours, and pulmonary vascularity. Lungs clear. No pleural effusion or pneumothorax. Bones unremarkable. IMPRESSION: No acute abnormalities. Electronically Signed   By: Ulyses SouthwardMark  Boles M.D.   On: 12/31/2015 19:53   Patient Profile     55 y/o ? with a h/o crack cocaine abuse that [facility], following using crack cocaine earlier in the day and developing c/p.    Assessment & Plan    1.  Acute Coronary Syndrome:  Pt presented 9/7 with  chest pain in the setting of crack cocaine usage.  ECG on arrival was notable for inferior and antlat TWI.  This has since resolved.  Troponin has risen to 1.79.  Though this may be related to coronary vasospasm, we have recommended diagnostic catheterization for clarification.  She is not willing t   Will plan med Rx with asa and statin.  Will add low dose amlodipine for presumed coronary vasospasm.  No  blocker in setting of crack usage.  2.  Crack cocaine abuse: she says that she has a 20 yr h/o cocaine usage.  She quit for 9 mos but started up again 2 mos ago.  She is convinced that she was given a bad batch on 9/7, leading to c/p and admission.  Complete cessation advised.  I will ask SW to see - ? Resources in the community.   3.  HTG:  TG are markedly elevated.  Would DC on Atorvastatin and fenofibrate.   4.  Hyperglycemia: F/u A1c.  Kristeen MissPhilip Filbert Craze, MD  01/02/2016 12:28 PM    [facility] [address],  [address] [place], [place]  [address] Pager [phone] Phone: [phone]; Fax: [phone]

## 2016-01-02 NOTE — Discharge Instructions (Signed)

## 2016-01-02 NOTE — Discharge Summary (Addendum)
Discharge Summary  Natasha Roach ZOX:[id number] DOB: 01-07-1961  PCP: No PCP Per Patient  Admit date: 12/31/2015 Discharge date: 01/02/2016  Time spent: >53mins, more than 50% time spent on care coordination  Recommendations for Outpatient Follow-up:  1. F/u with PMD within a week  for hospital discharge follow up, repeat cbc/bmp at follow up, pmd to continue monitor blood sugar and lipid control. pmd to follow up on final blood culture result. 2. Social worker consulted for substance abuse ( cocaine use)  Discharge Diagnoses:  Active Hospital Problems   Diagnosis Date Noted  . Acute coronary syndrome (HCC) 12/31/2015  . Morbid obesity (HCC) 01/01/2016  . Cocaine abuse 12/31/2015  . Leukocytosis 12/31/2015    Resolved Hospital Problems   Diagnosis Date Noted Date Resolved  No resolved problems to display.    Discharge Condition: stable  Diet recommendation: heart healthy/carb modified  Filed Weights   12/31/15 2036 01/01/16 0500 01/02/16 0400  Weight: 92.3 kg (203 lb 8 oz) 92.1 kg (203 lb 0.7 oz) 92.9 kg (204 lb 11.2 oz)    History of present illness:  HPI: Natasha Roach is a 55 y.o. female with medical history significant of "heart attack" in 2004 in setting of cocaine use.  Ongoing cocaine use last used crack earlier today she admits.  After using crack she developed gradual onset of central chest pain with radiation to back, nausea.  This onset several hours ago, was severe 10/10.  Patient called EMS.  Brought in initially as a code STEMI.  ED Course: By the time she got to ED, a repeat EKG at 1819 showed inferior lateral T wave inversion but no STEMI.  And 17 mins later at 1833 another repeat EKG looked even better with improvement in T waves in inferior lateral leads.  She also donated plasma this morning.  Hospital Course:  Principal Problem:   Acute coronary syndrome [facility]) Active Problems:   Cocaine abuse   Leukocytosis   Morbid obesity  (HCC)   1. 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 Pain down to 1/10 after just 1 SL NTG 1. A1C 7, lipid pnl with significantly elevated triglyceride, repeat lipid panel the same, troponin peaked at 1.72 2. Cardiology consulted, patient declined cardiac cath 3. Echo LVEF 55%, no wall motion abnormalities  4. Cardiology started asa/statin/low dose norvasc for vasospasm, avoid betablocker in cocaine use 5. She is chest pain free at time of discharge. 2. Hypotension on arrival to ED - 1. Complete resolution with just 500 cc of NS and 1 SL NTG.  3. Leukocytosis -  1. UA + uti +trichomonas, + yeast, she report abnormal vaginal discharge 2. BCx no growth at time of discharge, final result pending 3. She is treated with rocephin, flagyl, diflucan x1 dose  In the hospital, she is discharged on keflex/flagyl to finish course, she is also given diflucan prn for yeast infection.  4. Subacute / chronic low back pain - 1. Prescribed vicodin, prednisone, and flexeril by EDP last week, no new prescription provided  5. Hematochezia - 1. Only scant amount, and doubt major GI bleed, HGB is actually elevated at 15.4 despite the episode of BRBPR yesterday 2. From hemorrhoids? hgb stable, no more bleed in the hospital  6. Cocaine abuse, cessation education provided, social worker consulted  7. HLD, she is started on lipitor and fenofibrate   8. noninsulin dependent diabetes, new diagnosis, a1c7, with family history, started on metformin, diet control, close follow up with pmd.  DVT prophylaxis while in the hospital:Heparin gtt Code Status:Full Family Communication:No family in room Consults called:Cards    Procedures:  none  Antibiotics:  Rocephin/flagyl/diflucan   Discharge Exam: BP 131/83 (BP Location: Right Arm)   Pulse 67   Temp 98.3 F (36.8 C) (Oral)   Resp 15   Ht 5' 7.01" (1.702 m)   Wt 92.9 kg (204 lb 11.2 oz)   SpO2 99%   BMI 32.05 kg/m    General: NAD Cardiovascular: RRR Respiratory: CTABL  Discharge Instructions You were cared for by a hospitalist during your hospital stay. If you have any questions about your discharge medications or the care you received while you were in the hospital after you are discharged, you can call the unit and asked to speak with the hospitalist on call if the hospitalist that took care of you is not available. Once you are discharged, your primary care physician will handle any further medical issues. Please note that NO REFILLS for any discharge medications will be authorized once you are discharged, as it is imperative that you return to your primary care physician (or establish a relationship with a primary care physician if you do not have one) for your aftercare needs so that they can reassess your need for medications and monitor your lab values.  Discharge Instructions    Diet - low sodium heart healthy    Complete by:  As directed   Carb modified diet   Increase activity slowly    Complete by:  As directed       Medication List    TAKE these medications   amLODipine 2.5 MG tablet Commonly known as:  NORVASC Take 1 tablet (2.5 mg total) by mouth daily.   aspirin 81 MG EC tablet Take 1 tablet (81 mg total) by mouth daily.   atorvastatin 80 MG tablet Commonly known as:  LIPITOR Take 1 tablet (80 mg total) by mouth daily at 6 PM.   cephALEXin 500 MG capsule Commonly known as:  KEFLEX Take 1 capsule (500 mg total) by mouth 2 (two) times daily.   fenofibrate 145 MG tablet Commonly known as:  TRICOR Take 1 tablet (145 mg total) by mouth daily.   fluconazole 150 MG tablet Commonly known as:  DIFLUCAN Take 1 tablet (150 mg total) by mouth daily. As needed for yeast infection.   metFORMIN 500 MG tablet Commonly known as:  GLUCOPHAGE Take 1 tablet (500 mg total) by mouth 2 (two) times daily with a meal.   metroNIDAZOLE 500 MG tablet Commonly known as:  FLAGYL Take 1 tablet  (500 mg total) by mouth every 8 (eight) hours.   nitroGLYCERIN 0.4 MG SL tablet Commonly known as:  NITROSTAT Place 1 tablet (0.4 mg total) under the tongue every 5 (five) minutes x 3 doses as needed for chest pain.      No Known Allergies Follow-up Information    [facility] .   Why:  Hospital follow-up appointment on 01/05/16 at 10:00 am with Scot Jun, PA. repeat lipid panel at hospital discharge follow up. please discuss with your primary care doctor regarding blood sugar control and monitoring Contact information: [address] [facility] [address] [phone]           The results of significant diagnostics from this hospitalization (including imaging, microbiology, ancillary and laboratory) are listed below for reference.    Significant Diagnostic Studies: Dg Chest 2 View  Result Date: 12/31/2015 CLINICAL DATA:  Mid chest pain or bad cough for 9 months, smoker, history MI EXAM: CHEST  2 VIEW COMPARISON:  09/07/2012 FINDINGS: Normal heart size, mediastinal contours, and pulmonary vascularity. Lungs clear. No pleural effusion or pneumothorax. Bones unremarkable. IMPRESSION: No acute abnormalities. Electronically Signed   By: Ulyses Southward M.D.   On: 12/31/2015 19:53    Microbiology: Recent Results (from the past 240 hour(s))  MRSA PCR Screening     Status: None   Collection Time: 12/31/15  9:15 PM  Result Value Ref Range Status   MRSA by PCR NEGATIVE NEGATIVE Final    Comment:        The GeneXpert MRSA Assay (FDA approved for NASAL specimens only), is one component of a comprehensive MRSA colonization surveillance program. It is not intended to diagnose MRSA infection nor to guide or monitor treatment for MRSA infections.   Culture, blood (routine x 2)     Status: None (Preliminary result)   Collection Time: 12/31/15 10:28 PM  Result Value Ref Range Status   Specimen Description BLOOD RIGHT ANTECUBITAL  Final    Special Requests BOTTLES DRAWN AEROBIC ONLY 9CC  Final   Culture NO GROWTH 2 DAYS  Final   Report Status PENDING  Incomplete  Culture, blood (routine x 2)     Status: None (Preliminary result)   Collection Time: 12/31/15 10:36 PM  Result Value Ref Range Status   Specimen Description BLOOD RIGHT HAND  Final   Special Requests BOTTLES DRAWN AEROBIC AND ANAEROBIC 5CC EA  Final   Culture NO GROWTH 2 DAYS  Final   Report Status PENDING  Incomplete  Urine culture     Status: Abnormal   Collection Time: 12/31/15 11:21 PM  Result Value Ref Range Status   Specimen Description URINE, RANDOM  Final   Special Requests NONE  Final   Culture MULTIPLE SPECIES PRESENT, SUGGEST RECOLLECTION (A)  Final   Report Status 01/02/2016 FINAL  Final     Labs: Basic Metabolic Panel:  Recent Labs Lab 12/31/15 1800 12/31/15 2033 01/01/16 0754 01/02/16 0453  NA 136  --  139 137  K 4.4  --  4.4 4.4  CL 105  --  106 104  CO2 21*  --  24 24  GLUCOSE 195*  --  150* 138*  BUN 14  --  11 9  CREATININE 0.91  --  0.72 0.46  CALCIUM 8.9  --  8.7* 8.7*  MG  --  2.3  --  2.6*   Liver Function Tests:  Recent Labs Lab 12/31/15 2041  AST 19  ALT 18  ALKPHOS 96  BILITOT 0.5  PROT 5.2*  ALBUMIN 3.4*   No results for input(s): LIPASE, AMYLASE in the last 168 hours. No results for input(s): AMMONIA in the last 168 hours. CBC:  Recent Labs Lab 12/31/15 1800 01/01/16 0220 01/02/16 0453  WBC 26.8* 19.9* 14.7*  NEUTROABS 19.3*  --   --   HGB 15.4* 14.0 13.7  HCT 48.1* 44.0 42.4  MCV 85.7 86.3 86.5  PLT 193 177 196   Cardiac Enzymes:  Recent Labs Lab 12/31/15 2033 01/01/16 0220 01/01/16 0754 01/02/16 0453  TROPONINI 0.05* 0.79* 1.72* 0.65*   BNP: BNP (last 3 results) No results for input(s): BNP in the last 8760 hours.  ProBNP (last 3 results) No results for input(s): PROBNP in the last 8760 hours.  CBG: No results for input(s): GLUCAP in the last 168  hours.     SignedAlbertine Grates MD, PhD  [facility] 01/02/2016, 12:52 PM

## 2016-01-05 ENCOUNTER — Inpatient Hospital Stay: Payer: Self-pay

## 2016-01-05 LAB — CULTURE, BLOOD (ROUTINE X 2)
Culture: NO GROWTH
Culture: NO GROWTH

## 2016-01-07 ENCOUNTER — Inpatient Hospital Stay: Payer: Self-pay

## 2016-01-07 LAB — URINE DRUGS OF ABUSE SCREEN W ALC, ROUTINE (REF LAB)
Amphetamines, Urine: NEGATIVE ng/mL
BARBITURATE, UR: NEGATIVE ng/mL
Benzodiazepine Quant, Ur: NEGATIVE ng/mL
CANNABINOID QUANT UR: NEGATIVE ng/mL
Methadone Screen, Urine: NEGATIVE ng/mL
Opiate Quant, Ur: NEGATIVE ng/mL
Phencyclidine, Ur: NEGATIVE ng/mL
Propoxyphene, Urine: NEGATIVE ng/mL

## 2016-01-07 LAB — COCAINE CONF, UR
Benzoylecgonine GC/MS Conf: 32520 ng/mL
COCAINE METAB QUANT UR: POSITIVE — AB

## 2016-01-07 LAB — PANEL 716105: ETHANOL: 0.152 %

## 2016-04-21 ENCOUNTER — Encounter (HOSPITAL_COMMUNITY)
Admission: EM | Disposition: A | Discharge status: Home / Self Care | Payer: Self-pay | Source: Home / Self Care | Attending: Cardiovascular Disease

## 2016-04-21 ENCOUNTER — Inpatient Hospital Stay (HOSPITAL_COMMUNITY): Payer: Self-pay

## 2016-04-21 ENCOUNTER — Ambulatory Visit (HOSPITAL_COMMUNITY): Admit: 2016-04-21 | Payer: Self-pay | Admitting: Cardiovascular Disease

## 2016-04-21 ENCOUNTER — Inpatient Hospital Stay (HOSPITAL_COMMUNITY)
Admission: EM | Admit: 2016-04-21 | Discharge: 2016-04-23 | DRG: 246 | Disposition: A | Discharge status: Home / Self Care | Payer: Self-pay | Attending: Cardiovascular Disease | Admitting: Cardiovascular Disease

## 2016-04-21 ENCOUNTER — Encounter (HOSPITAL_COMMUNITY): Payer: Self-pay | Admitting: Emergency Medicine

## 2016-04-21 ENCOUNTER — Emergency Department (HOSPITAL_COMMUNITY): Payer: Self-pay

## 2016-04-21 DIAGNOSIS — Z91199 Patient's noncompliance with other medical treatment and regimen due to unspecified reason: Secondary | ICD-10-CM

## 2016-04-21 DIAGNOSIS — E119 Type 2 diabetes mellitus without complications: Secondary | ICD-10-CM

## 2016-04-21 DIAGNOSIS — I472 Ventricular tachycardia, unspecified: Secondary | ICD-10-CM

## 2016-04-21 DIAGNOSIS — R9431 Abnormal electrocardiogram [ECG] [EKG]: Secondary | ICD-10-CM

## 2016-04-21 DIAGNOSIS — I255 Ischemic cardiomyopathy: Secondary | ICD-10-CM

## 2016-04-21 DIAGNOSIS — Z720 Tobacco use: Secondary | ICD-10-CM

## 2016-04-21 DIAGNOSIS — I4901 Ventricular fibrillation: Secondary | ICD-10-CM | POA: Diagnosis not present

## 2016-04-21 DIAGNOSIS — I2102 ST elevation (STEMI) myocardial infarction involving left anterior descending coronary artery: Secondary | ICD-10-CM

## 2016-04-21 DIAGNOSIS — I213 ST elevation (STEMI) myocardial infarction of unspecified site: Secondary | ICD-10-CM

## 2016-04-21 DIAGNOSIS — Z955 Presence of coronary angioplasty implant and graft: Secondary | ICD-10-CM

## 2016-04-21 DIAGNOSIS — Z79899 Other long term (current) drug therapy: Secondary | ICD-10-CM

## 2016-04-21 DIAGNOSIS — I252 Old myocardial infarction: Secondary | ICD-10-CM

## 2016-04-21 DIAGNOSIS — I10 Essential (primary) hypertension: Secondary | ICD-10-CM

## 2016-04-21 DIAGNOSIS — E785 Hyperlipidemia, unspecified: Secondary | ICD-10-CM

## 2016-04-21 DIAGNOSIS — I2510 Atherosclerotic heart disease of native coronary artery without angina pectoris: Secondary | ICD-10-CM

## 2016-04-21 DIAGNOSIS — Z9119 Patient's noncompliance with other medical treatment and regimen: Secondary | ICD-10-CM

## 2016-04-21 DIAGNOSIS — Z7982 Long term (current) use of aspirin: Secondary | ICD-10-CM

## 2016-04-21 DIAGNOSIS — Z90710 Acquired absence of both cervix and uterus: Secondary | ICD-10-CM

## 2016-04-21 DIAGNOSIS — Z7984 Long term (current) use of oral hypoglycemic drugs: Secondary | ICD-10-CM

## 2016-04-21 DIAGNOSIS — I2109 ST elevation (STEMI) myocardial infarction involving other coronary artery of anterior wall: Secondary | ICD-10-CM

## 2016-04-21 DIAGNOSIS — Z6836 Body mass index (BMI) 36.0-36.9, adult: Secondary | ICD-10-CM

## 2016-04-21 DIAGNOSIS — F1410 Cocaine abuse, uncomplicated: Secondary | ICD-10-CM | POA: Diagnosis present

## 2016-04-21 DIAGNOSIS — F17210 Nicotine dependence, cigarettes, uncomplicated: Secondary | ICD-10-CM | POA: Diagnosis present

## 2016-04-21 HISTORY — PX: CARDIAC CATHETERIZATION: SHX172

## 2016-04-21 HISTORY — DX: Ischemic cardiomyopathy: I25.5

## 2016-04-21 HISTORY — DX: Essential (primary) hypertension: I10

## 2016-04-21 HISTORY — DX: Cocaine abuse, uncomplicated: F14.10

## 2016-04-21 HISTORY — DX: Morbid (severe) obesity due to excess calories: E66.01

## 2016-04-21 HISTORY — DX: Ventricular tachycardia: I47.2

## 2016-04-21 HISTORY — DX: Tobacco use: Z72.0

## 2016-04-21 HISTORY — DX: Hyperlipidemia, unspecified: E78.5

## 2016-04-21 HISTORY — DX: Patient's noncompliance with other medical treatment and regimen: Z91.19

## 2016-04-21 HISTORY — PX: ELECTROPHYSIOLOGIC STUDY: SHX172A

## 2016-04-21 HISTORY — DX: Atherosclerotic heart disease of native coronary artery without angina pectoris: I25.10

## 2016-04-21 HISTORY — DX: Ventricular tachycardia, unspecified: I47.20

## 2016-04-21 HISTORY — DX: Patient's noncompliance with other medical treatment and regimen due to unspecified reason: Z91.199

## 2016-04-21 HISTORY — DX: Abnormal electrocardiogram (ECG) (EKG): R94.31

## 2016-04-21 LAB — RAPID URINE DRUG SCREEN, HOSP PERFORMED
Amphetamines: NOT DETECTED
BENZODIAZEPINES: POSITIVE — AB
Barbiturates: NOT DETECTED
Cocaine: POSITIVE — AB
Opiates: NOT DETECTED
Tetrahydrocannabinol: NOT DETECTED

## 2016-04-21 LAB — TROPONIN I

## 2016-04-21 LAB — COMPREHENSIVE METABOLIC PANEL
ALBUMIN: 3.1 g/dL — AB (ref 3.5–5.0)
ALT: 15 U/L (ref 14–54)
ANION GAP: 8 (ref 5–15)
AST: 15 U/L (ref 15–41)
Alkaline Phosphatase: 76 U/L (ref 38–126)
BILIRUBIN TOTAL: 0.6 mg/dL (ref 0.3–1.2)
BUN: 9 mg/dL (ref 6–20)
CHLORIDE: 111 mmol/L (ref 101–111)
CO2: 21 mmol/L — ABNORMAL LOW (ref 22–32)
Calcium: 8.3 mg/dL — ABNORMAL LOW (ref 8.9–10.3)
Creatinine, Ser: 0.81 mg/dL (ref 0.44–1.00)
GFR calc Af Amer: >60 mL/min (ref >60)
Glucose, Bld: 206 mg/dL — ABNORMAL HIGH (ref 65–99)
POTASSIUM: 3.5 mmol/L (ref 3.5–5.1)
Sodium: 140 mmol/L (ref 135–145)
TOTAL PROTEIN: 5.1 g/dL — AB (ref 6.5–8.1)

## 2016-04-21 LAB — APTT: APTT: 24 s (ref 24–36)

## 2016-04-21 LAB — DIFFERENTIAL
BASOS ABS: 0 10*3/uL (ref 0.0–0.1)
Basophils Relative: 0 %
EOS PCT: 1 %
Eosinophils Absolute: 0.2 10*3/uL (ref 0.0–0.7)
LYMPHS ABS: 6.9 10*3/uL — AB (ref 0.7–4.0)
Lymphocytes Relative: 30 %
MONOS PCT: 4 %
Monocytes Absolute: 0.9 10*3/uL (ref 0.1–1.0)
NEUTROS PCT: 65 %
Neutro Abs: 15 10*3/uL — ABNORMAL HIGH (ref 1.7–7.7)

## 2016-04-21 LAB — LIPID PANEL
CHOL/HDL RATIO: 4.9 ratio
Cholesterol: 170 mg/dL (ref 0–200)
HDL: 35 mg/dL — AB (ref >40)
LDL Cholesterol: 90 mg/dL (ref 0–99)
TRIGLYCERIDES: 223 mg/dL — AB (ref <150)
VLDL: 45 mg/dL — ABNORMAL HIGH (ref 0–40)

## 2016-04-21 LAB — TSH: TSH: 0.898 u[IU]/mL (ref 0.350–4.500)

## 2016-04-21 LAB — CBC
HEMATOCRIT: 44.2 % (ref 36.0–46.0)
HEMOGLOBIN: 14.5 g/dL (ref 12.0–15.0)
MCH: 27.8 pg (ref 26.0–34.0)
MCHC: 32.8 g/dL (ref 30.0–36.0)
MCV: 84.7 fL (ref 78.0–100.0)
Platelets: 229 10*3/uL (ref 150–400)
RBC: 5.22 MIL/uL — AB (ref 3.87–5.11)
RDW: 12.9 % (ref 11.5–15.5)
WBC: 23 10*3/uL — AB (ref 4.0–10.5)

## 2016-04-21 LAB — GLUCOSE, CAPILLARY: Glucose-Capillary: 167 mg/dL — ABNORMAL HIGH (ref 65–99)

## 2016-04-21 LAB — PROTIME-INR
INR: 1.06
PROTHROMBIN TIME: 13.9 s (ref 11.4–15.2)

## 2016-04-21 LAB — MRSA PCR SCREENING: MRSA BY PCR: NEGATIVE

## 2016-04-21 LAB — ETHANOL

## 2016-04-21 LAB — CBG MONITORING, ED: GLUCOSE-CAPILLARY: 209 mg/dL — AB (ref 65–99)

## 2016-04-21 SURGERY — LEFT HEART CATH AND CORONARY ANGIOGRAPHY
Anesthesia: Moderate Sedation

## 2016-04-21 MED ORDER — HEPARIN SODIUM (PORCINE) 5000 UNIT/ML IJ SOLN
5000.0000 [IU] | Freq: Three times a day (TID) | INTRAMUSCULAR | Status: DC
  Administered 2016-04-22 – 2016-04-23 (×4): 5000 [IU] via SUBCUTANEOUS
  Filled 2016-04-21 (×4): qty 1

## 2016-04-21 MED ORDER — BIVALIRUDIN 250 MG IV SOLR
INTRAVENOUS | Status: AC
  Filled 2016-04-21: qty 250

## 2016-04-21 MED ORDER — HEPARIN (PORCINE) IN NACL 2-0.9 UNIT/ML-% IJ SOLN
INTRAMUSCULAR | Status: AC
  Filled 2016-04-21: qty 1500

## 2016-04-21 MED ORDER — SODIUM CHLORIDE 0.9 % IV SOLN: 250.0000 mL | INTRAVENOUS | Status: DC | PRN

## 2016-04-21 MED ORDER — ONDANSETRON HCL 4 MG/2ML IJ SOLN: 4.0000 mg | Freq: Four times a day (QID) | INTRAMUSCULAR | Status: DC | PRN

## 2016-04-21 MED ORDER — ASPIRIN EC 81 MG PO TBEC: 81.0000 mg | DELAYED_RELEASE_TABLET | Freq: Every day | ORAL | Status: DC

## 2016-04-21 MED ORDER — NITROGLYCERIN IN D5W 200-5 MCG/ML-% IV SOLN: 0.0000 ug/min | INTRAVENOUS | Status: DC

## 2016-04-21 MED ORDER — POTASSIUM CHLORIDE CRYS ER 20 MEQ PO TBCR
40.0000 meq | EXTENDED_RELEASE_TABLET | Freq: Once | ORAL | Status: AC
  Administered 2016-04-21: 40 meq via ORAL
  Filled 2016-04-21: qty 2

## 2016-04-21 MED ORDER — TICAGRELOR 90 MG PO TABS
ORAL_TABLET | ORAL | Status: DC | PRN
Start: 2016-04-21 — End: 2016-04-21
  Administered 2016-04-21: 180 mg via ORAL

## 2016-04-21 MED ORDER — AMIODARONE HCL 150 MG/3ML IV SOLN
INTRAVENOUS | Status: DC | PRN
  Administered 2016-04-21: 300 mg via INTRAVENOUS

## 2016-04-21 MED ORDER — NITROGLYCERIN IN D5W 200-5 MCG/ML-% IV SOLN
INTRAVENOUS | Status: DC | PRN
  Administered 2016-04-21: 5 ug/min via INTRAVENOUS

## 2016-04-21 MED ORDER — ACETAMINOPHEN 325 MG PO TABS: 650.0000 mg | ORAL_TABLET | ORAL | Status: DC | PRN

## 2016-04-21 MED ORDER — IOPAMIDOL (ISOVUE-370) INJECTION 76%
INTRAVENOUS | Status: AC
  Filled 2016-04-21: qty 50

## 2016-04-21 MED ORDER — HEPARIN SODIUM (PORCINE) 5000 UNIT/ML IJ SOLN
4000.0000 [IU] | INTRAMUSCULAR | Status: AC
  Administered 2016-04-21: 4000 [IU] via INTRAVENOUS

## 2016-04-21 MED ORDER — SODIUM CHLORIDE 0.9% FLUSH
3.0000 mL | Freq: Two times a day (BID) | INTRAVENOUS | Status: DC
  Administered 2016-04-22 – 2016-04-23 (×3): 3 mL via INTRAVENOUS

## 2016-04-21 MED ORDER — AMIODARONE HCL IN DEXTROSE 360-4.14 MG/200ML-% IV SOLN
60.0000 mg/h | INTRAVENOUS | Status: DC
  Administered 2016-04-22 (×2): 60 mg/h via INTRAVENOUS
  Filled 2016-04-21 (×2): qty 200

## 2016-04-21 MED ORDER — ASPIRIN 81 MG PO CHEW
81.0000 mg | CHEWABLE_TABLET | Freq: Every day | ORAL | Status: DC
  Administered 2016-04-22 – 2016-04-23 (×2): 81 mg via ORAL
  Filled 2016-04-21 (×2): qty 1

## 2016-04-21 MED ORDER — TICAGRELOR 90 MG PO TABS
ORAL_TABLET | ORAL | Status: AC
  Filled 2016-04-21: qty 2

## 2016-04-21 MED ORDER — IOPAMIDOL (ISOVUE-370) INJECTION 76%
INTRAVENOUS | Status: DC | PRN
  Administered 2016-04-21: 175 mL via INTRA_ARTERIAL

## 2016-04-21 MED ORDER — BIVALIRUDIN BOLUS VIA INFUSION - CUPID
INTRAVENOUS | Status: DC | PRN
  Administered 2016-04-21: 74.85 mg via INTRAVENOUS

## 2016-04-21 MED ORDER — SODIUM CHLORIDE 0.9 % IV SOLN
INTRAVENOUS | Status: DC | PRN
  Administered 2016-04-21 (×2): 1.75 mg/kg/h via INTRAVENOUS

## 2016-04-21 MED ORDER — SODIUM CHLORIDE 0.9 % IV SOLN
INTRAVENOUS | Status: DC
  Administered 2016-04-21: 22:00:00 via INTRAVENOUS

## 2016-04-21 MED ORDER — TICAGRELOR 90 MG PO TABS: 90.0000 mg | ORAL_TABLET | Freq: Two times a day (BID) | ORAL | Status: DC

## 2016-04-21 MED ORDER — SODIUM CHLORIDE 0.9 % IV SOLN
10.0000 mL/h | INTRAVENOUS | Status: DC
  Administered 2016-04-21: 1000 mL/h via INTRAVENOUS

## 2016-04-21 MED ORDER — CLOPIDOGREL BISULFATE 75 MG PO TABS: 75.0000 mg | ORAL_TABLET | Freq: Every day | ORAL | Status: DC

## 2016-04-21 MED ORDER — LABETALOL HCL 5 MG/ML IV SOLN
10.0000 mg | INTRAVENOUS | Status: DC | PRN
Start: 2016-04-21 — End: 2016-04-22

## 2016-04-21 MED ORDER — INSULIN ASPART 100 UNIT/ML ~~LOC~~ SOLN: 0.0000 [IU] | Freq: Three times a day (TID) | SUBCUTANEOUS | Status: DC

## 2016-04-21 MED ORDER — ALPRAZOLAM 0.5 MG PO TABS: 1.0000 mg | ORAL_TABLET | Freq: Two times a day (BID) | ORAL | Status: DC | PRN

## 2016-04-21 MED ORDER — HYDRALAZINE HCL 20 MG/ML IJ SOLN: 5.0000 mg | INTRAMUSCULAR | Status: DC | PRN

## 2016-04-21 MED ORDER — NITROGLYCERIN IN D5W 200-5 MCG/ML-% IV SOLN
INTRAVENOUS | Status: AC
  Filled 2016-04-21: qty 250

## 2016-04-21 MED ORDER — AMIODARONE LOAD VIA INFUSION: INTRAVENOUS | Status: DC | PRN

## 2016-04-21 MED ORDER — CLOPIDOGREL BISULFATE 300 MG PO TABS
600.0000 mg | ORAL_TABLET | Freq: Once | ORAL | Status: AC
  Administered 2016-04-22: 600 mg via ORAL
  Filled 2016-04-21: qty 2

## 2016-04-21 MED ORDER — CLOPIDOGREL BISULFATE 75 MG PO TABS
75.0000 mg | ORAL_TABLET | Freq: Every day | ORAL | Status: DC
Start: 2016-04-23 — End: 2016-04-21

## 2016-04-21 MED ORDER — ONDANSETRON HCL 4 MG/2ML IJ SOLN
4.0000 mg | Freq: Four times a day (QID) | INTRAMUSCULAR | Status: DC | PRN
  Administered 2016-04-22: 4 mg via INTRAVENOUS
  Filled 2016-04-21 (×2): qty 2

## 2016-04-21 MED ORDER — AMIODARONE HCL 150 MG/3ML IV SOLN
INTRAVENOUS | Status: AC
  Filled 2016-04-21: qty 6

## 2016-04-21 MED ORDER — BIVALIRUDIN 250 MG IV SOLR
1.7500 mg/kg/h | INTRAVENOUS | Status: DC
  Administered 2016-04-21: 1.75 mg/kg/h via INTRAVENOUS
  Filled 2016-04-21 (×2): qty 250

## 2016-04-21 MED ORDER — INSULIN ASPART 100 UNIT/ML ~~LOC~~ SOLN: 0.0000 [IU] | Freq: Every day | SUBCUTANEOUS | Status: DC

## 2016-04-21 MED ORDER — SODIUM CHLORIDE 0.9 % IV SOLN
INTRAVENOUS | Status: DC | PRN
  Administered 2016-04-21: 150 mL/h via INTRAVENOUS

## 2016-04-21 MED ORDER — SODIUM CHLORIDE 0.9% FLUSH: 3.0000 mL | INTRAVENOUS | Status: DC | PRN

## 2016-04-21 MED ORDER — ATORVASTATIN CALCIUM 80 MG PO TABS
80.0000 mg | ORAL_TABLET | Freq: Every day | ORAL | Status: DC
  Administered 2016-04-22: 80 mg via ORAL
  Filled 2016-04-21: qty 1

## 2016-04-21 MED ORDER — ATORVASTATIN CALCIUM 80 MG PO TABS: 80.0000 mg | ORAL_TABLET | Freq: Every day | ORAL | Status: DC

## 2016-04-21 MED ORDER — NITROGLYCERIN 1 MG/10 ML FOR IR/CATH LAB
INTRA_ARTERIAL | Status: DC | PRN
  Administered 2016-04-21: 200 ug via INTRACORONARY
  Administered 2016-04-21: 100 ug via INTRACORONARY

## 2016-04-21 MED ORDER — NITROGLYCERIN 1 MG/10 ML FOR IR/CATH LAB
INTRA_ARTERIAL | Status: AC
  Filled 2016-04-21: qty 10

## 2016-04-21 MED ORDER — NITROGLYCERIN 0.4 MG SL SUBL: 0.4000 mg | SUBLINGUAL_TABLET | SUBLINGUAL | Status: DC | PRN

## 2016-04-21 MED ORDER — IOPAMIDOL (ISOVUE-370) INJECTION 76%
INTRAVENOUS | Status: AC
  Filled 2016-04-21: qty 125

## 2016-04-21 MED ORDER — LIDOCAINE HCL (PF) 1 % IJ SOLN
INTRAMUSCULAR | Status: AC
  Filled 2016-04-21: qty 30

## 2016-04-21 SURGICAL SUPPLY — 16 items
BALLN EMERGE MR 2.0X12 (BALLOONS) ×3
BALLN ~~LOC~~ EMERGE MR 3.75X15 (BALLOONS) ×3
BALLOON EMERGE MR 2.0X12 (BALLOONS) IMPLANT
BALLOON ~~LOC~~ EMERGE MR 3.75X15 (BALLOONS) IMPLANT
CATH INFINITI 5FR MULTPACK ANG (CATHETERS) ×2 IMPLANT
CATH VISTA GUIDE 6FR XBLAD3.5 (CATHETERS) ×2 IMPLANT
KIT ENCORE 26 ADVANTAGE (KITS) ×2 IMPLANT
KIT HEART LEFT (KITS) ×3 IMPLANT
PACK CARDIAC CATHETERIZATION (CUSTOM PROCEDURE TRAY) ×3 IMPLANT
SHEATH PINNACLE 6F 10CM (SHEATH) ×2 IMPLANT
STENT SYNERGY DES 3.5X20 (Permanent Stent) ×2 IMPLANT
SYR MEDRAD MARK V 150ML (SYRINGE) ×3 IMPLANT
TRANSDUCER W/STOPCOCK (MISCELLANEOUS) ×3 IMPLANT
TUBING CIL FLEX 10 FLL-RA (TUBING) ×3 IMPLANT
WIRE ASAHI MEDIUM 180CM (WIRE) ×2 IMPLANT
WIRE EMERALD 3MM-J .035X150CM (WIRE) ×2 IMPLANT

## 2016-04-21 NOTE — H&P (Addendum)
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 She is status post a Synergy stent to the mid left anterior descending artery c/b VT/VF during reperfusion.  The history is limited by her mental status following Haldol 10 mg IV and Versed 5 mg IV, given by EMS for her combativeness.   She states that she last used crack cocaine one hour prior to presentation. She then immediately developed severe substernal chest pain with shortness of breath that prompted her to call 911. The on scene EKG was concerning for ST elevation myocardial infarction and she was brought immediately to our emergency department. In route, she was given the medications above as well as ASA 325 mg p.o. and fluids wide open for hypotension. Upon arrival, 12-lead EKG demonstrated anterolateral ST elevations and she was given Heparin 4000 units IV and taken to the Cath Lab.  Following right femoral access, diagnostic angiogram demonstrated thrombotic occlusion of the mid left anterior descending artery shortly after the first diagonal. Shortly after initial reperfusion, the patient experienced an episode of ventricular tachycardia which degenerated into ventricular fibrillation requiring 1 defibrillation. She was given Amiodarone 300 mg IV and begun on an Amiodarone gtt. A Synergy stent was successfully deployed to the mid left anterior descending artery. LV gram demonstrated hypokinesis of the  anterior wall and apex.  Per chart review, [name] is currently uninsured and has no primary care physician.   She continues to make 1 ppd of cigs.                                                                           Review of Systems:     Cardiac Review of Systems: {Y] = yes [ ]  = no  Chest Pain [X]   Resting SOB [X]  Exertional SOB  [  ]  Orthopnea [  ]   Pedal Edema [   ]    Palpitations [X]  Syncope  [  ]   Presyncope [   ]  General Review of Systems: [Y] = yes [  ]=no Constitional: recent weight change [  ]; anorexia [  ]; fatigue [X] ; nausea [X] ; night sweats [  ]; fever [  ]; or chills [  ];                                                                     Dental: poor dentition[  ];   Eye : blurred vision [  ]; diplopia [   ];  vision changes [  ];  Amaurosis fugax[  ]; Resp: cough [  ];  wheezing[  ];  hemoptysis[  ]; shortness of breath[X] ; paroxysmal nocturnal dyspnea[  ]; dyspnea on exertion[  ]; or orthopnea[  ];  GI:  gallstones[  ], vomiting[  ];  dysphagia[  ]; melena[  ];  hematochezia [  ]; heartburn[  ];   GU: kidney stones [  ]; hematuria[  ];   dysuria [  ];  nocturia[  ];               Skin: rash [  ], swelling[  ];, hair loss[  ];  peripheral edema[  ];  or itching[  ]; Musculosketetal: myalgias[  ];  joint swelling[  ];  joint erythema[  ];  joint pain[  ];  back pain[X] ;  Heme/Lymph: bruising[  ];  bleeding[  ];  anemia[  ];  Neuro: TIA[  ];  headaches[  ];  stroke[  ];  vertigo[  ];  seizures[  ];   paresthesias[  ];  difficulty walking[  ];  Psych:depression[  ]; anxiety[  ];  Endocrine: diabetes[X] ;  thyroid dysfunction[  ];  Other:  Past Medical History:  Diagnosis Date  . MI (myocardial infarction)    2004 cocaine induced  . Substance abuse     Prior to Admission medications   Medication Sig Start Date End Date Taking? Authorizing Provider  amLODipine (NORVASC) 2.5 MG tablet Take 1 tablet (2.5 mg total) by mouth daily. 01/02/16    Albertine Grates, MD  aspirin EC 81 MG EC tablet Take 1 tablet (81 mg total) by mouth daily. 01/02/16   Albertine Grates, MD  atorvastatin (LIPITOR) 80 MG tablet Take 1 tablet (80 mg total) by mouth daily at 6 PM. 01/02/16   Albertine Grates, MD  cephALEXin (KEFLEX) 500 MG capsule Take 1 capsule (500 mg total) by mouth 2 (two) times daily. 01/02/16   Albertine Grates, MD  fenofibrate (TRICOR) 145 MG tablet Take 1 tablet (145 mg total) by mouth daily. 01/02/16   Albertine Grates, MD  fluconazole (DIFLUCAN) 150 MG tablet Take 1 tablet (150 mg total) by mouth daily. As needed for yeast infection. 01/02/16   Albertine Grates, MD  metFORMIN (GLUCOPHAGE) 500 MG tablet Take 1 tablet (500 mg total) by mouth 2 (two) times daily with a meal. 01/02/16   Albertine Grates, MD  metroNIDAZOLE (FLAGYL) 500 MG tablet Take 1 tablet (500 mg total) by mouth every 8 (eight) hours. 01/02/16   Albertine Grates, MD  nitroGLYCERIN (NITROSTAT) 0.4 MG SL tablet Place 1 tablet (0.4 mg total) under the tongue every 5 (five) minutes x 3 doses as needed for chest pain. 01/02/16   Albertine Grates, MD   No Known Allergies  Social History   Social History  . Marital status: Significant Other    Spouse name: N/A  . Number of children: N/A  . Years of education: N/A   Occupational History  . Not on file.   Social History Main Topics  . Smoking status: Current Every Day Smoker    Packs/day: 1.00    Years: 40.00    Types: Cigarettes  . Smokeless tobacco: Never Used  . Alcohol use No  . Drug use:     Types: Cocaine     Comment: last use 04/21/16  . Sexual activity: Yes   Other Topics Concern  . Not on file   Social History Narrative  . No narrative on file  No family history on file.  PHYSICAL EXAM: Vitals:   04/21/16 2117 04/21/16 2122  BP: (!) 142/83 131/86  Pulse: 71 (!) 0  Resp: (!) 21 18  Temp:     General:  Somnolent. No respiratory difficulty HEENT: Normal Neck: Supple. no JVD. Carotids 2+ bilat; no bruits. No lymphadenopathy or thryomegaly appreciated. Cor: PMI nondisplaced. Regular  rate & rhythm. No rubs, gallops or murmurs. Lungs: Clear Abdomen: Soft, nontender, nondistended. No hepatosplenomegaly. No bruits or masses. Good bowel sounds. Extremities: No cyanosis, clubbing, rash, edema. RFA sheath in place. Neuro: Alert & oriented x 3, cranial nerves grossly intact. moves all 4 extremities w/o difficulty. Affect pleasant.  ECG: Sinus rhythm, anterolateral ST segment elevation, low voltages in precordial leads  Results for orders placed or performed during the hospital encounter of 04/21/16 (from the past 24 hour(s))  CBC     Status: Abnormal   Collection Time: 04/21/16  7:45 PM  Result Value Ref Range   WBC 23.0 (H) 4.0 - 10.5 K/uL   RBC 5.22 (H) 3.87 - 5.11 MIL/uL   Hemoglobin 14.5 12.0 - 15.0 g/dL   HCT 16.1 09.6 - 04.5 %   MCV 84.7 78.0 - 100.0 fL   MCH 27.8 26.0 - 34.0 pg   MCHC 32.8 30.0 - 36.0 g/dL   RDW 40.9 81.1 - 91.4 %   Platelets 229 150 - 400 K/uL  Differential     Status: Abnormal   Collection Time: 04/21/16  7:45 PM  Result Value Ref Range   Neutrophils Relative % 65 %   Lymphocytes Relative 30 %   Monocytes Relative 4 %   Eosinophils Relative 1 %   Basophils Relative 0 %   Neutro Abs 15.0 (H) 1.7 - 7.7 K/uL   Lymphs Abs 6.9 (H) 0.7 - 4.0 K/uL   Monocytes Absolute 0.9 0.1 - 1.0 K/uL   Eosinophils Absolute 0.2 0.0 - 0.7 K/uL   Basophils Absolute 0.0 0.0 - 0.1 K/uL   WBC Morphology ABSOLUTE LYMPHOCYTOSIS   Protime-INR     Status: None   Collection Time: 04/21/16  7:45 PM  Result Value Ref Range   Prothrombin Time 13.9 11.4 - 15.2 seconds   INR 1.06   APTT     Status: None   Collection Time: 04/21/16  7:45 PM  Result Value Ref Range   aPTT 24 24 - 36 seconds  Comprehensive metabolic panel     Status: Abnormal   Collection Time: 04/21/16  7:45 PM  Result Value Ref Range   Sodium 140 135 - 145 mmol/L   Potassium 3.5 3.5 - 5.1 mmol/L   Chloride 111 101 - 111 mmol/L   CO2 21 (L) 22 - 32 mmol/L   Glucose, Bld 206 (H) 65 - 99 mg/dL    BUN 9 6 - 20 mg/dL   Creatinine, Ser 7.82 0.44 - 1.00 mg/dL   Calcium 8.3 (L) 8.9 - 10.3 mg/dL   Total Protein 5.1 (L) 6.5 - 8.1 g/dL   Albumin 3.1 (L) 3.5 - 5.0 g/dL   AST 15 15 - 41 U/L   ALT 15 14 - 54 U/L   Alkaline Phosphatase 76 38 - 126 U/L   Total Bilirubin 0.6 0.3 - 1.2 mg/dL   GFR calc non Af Amer >60 >60 mL/min   GFR calc Af Amer >60 >60 mL/min   Anion gap 8 5 - 15  Troponin I     Status: None   Collection Time: 04/21/16  7:45 PM  Result Value Ref Range   Troponin I <0.03 <0.03 ng/mL  Lipid panel     Status: Abnormal   Collection Time: 04/21/16  7:45 PM  Result Value Ref Range   Cholesterol 170 0 - 200 mg/dL   Triglycerides 829223 (H) <150 mg/dL   HDL 35 (L) >56>40 mg/dL   Total CHOL/HDL Ratio 4.9 RATIO   VLDL 45 (H) 0 - 40 mg/dL   LDL Cholesterol 90 0 - 99 mg/dL  CBG monitoring, ED     Status: Abnormal   Collection Time: 04/21/16  7:45 PM  Result Value Ref Range   Glucose-Capillary 209 (H) 65 - 99 mg/dL  Ethanol     Status: None   Collection Time: 04/21/16  7:56 PM  Result Value Ref Range   Alcohol, Ethyl (B) <5 <5 mg/dL   No results found.  TTE 01/11/2016: - Left ventricle: The cavity size was normal. Wall thickness was   increased in a pattern of mild LVH. Systolic function was normal.   The estimated ejection fraction was in the range of 50% to 55%.   Wall motion was normal; there were no regional wall motion   abnormalities. Left ventricular diastolic function parameters   were normal.  ASSESSMENT: 55 yo F with a past medical history of cocaine induced myocardial infarction in 2004 status post left heart catheterization presumably without PCI, recent cocaine induced NSTEMI in 12/2015, diabetes type 2, hypertension, hyperlipidemia, and tobacco abuse who presents with one hour of severe substernal chest pain and is found to have a STEMI involving the mid left anterior descending artery secondary to cocaine abuse. She is status post a Synergy stent to the mid left  anterior descending artery c/b VT/VF during reperfusion.  PLAN/DISCUSSION: #) Cv(i) - STEMI involving the mid left anterior descending artery secondary to cocaine abuse, initial episode of care. She is status post a Synergy stent to the mid left anterior descending artery. Dx: - CBC, Chem 7, TSH, A1c, Lipid panel - EKG, Tele - Echo for risk stratification  - Cors as above, Dr. Tresa EndoKelly to place post-PCI orders, finalize Report Tx: - ASA 325 mg x 1 then ASA 81 mg daily - Consider Clopidogrel > Ticagrelor given cost, once daily dosing; DAPT ideally for 1 yr - Bivalirudin gtt x 4 hours post-procedure - NTG gtt overnight to reduce risk of spasm - Atorvastatin 80 mg daily - Given large anterior STEMI and reduced LVEF on LVgram, would begin ACEi prior to BB in the AM (not starting overnight given hypotension) - Weigh risks and benefits of BB given ongoing cocaine use; given STEMI/depressed LVEF on LVgram and VT/VF, favor starting BB. Use Coreg (has alpha-blockage also) - If EF<45% and DM, consider Aldosterone antagonist once at target dose of ACEi - Monitor groin site - STOP smoking - Cardiac Rehab  #) Cv(r) - VT/VF during reperfusion (reversible cause) Dx: - Tele Tx: - Correct K - Amiodarone gtt overnight - Given STEMI/depressed LVEF on LVgram and VT/VF, favor starting BB. Use Coreg (has alpha-blockage also) - Given reversible cause, guidelines suggest that she does not need ICD prior to discharge unless she has another episode outside of the peri-Cath period  #) Cv(p) - depressed on Lvgram Dx: - TTE Tx: - ACEi, BB, aldosterone antagonist as above  #) DM2 Dx: - A1c Tx: - SSI  #) Cocaine abuse - Emphasized that if she continues, Cocaine will lead to her death, encouraged her to quit - SW C/S  #) Tobacco abuse -  Nicotine patch - Emphasized need to quit  #) Inability to pay for medications - SW C/S  #) Heparin SQ  #) ADMIT TO ICU for monitoring  Renae FickleAditya Mandawat,  MD Cardiology  This note was dictated using voice recognition technology. Please excuse any typos.   Addendum:  Per the procedure log, she was given Ticagrelor 180 mg p.o. We will reload her with Clopidogrel 600 mg by mouth in the morning and then start Clopidogrel 75 mg daily (see rationale above).   Patient seen and examined. Agree with assessment and plan. Ms. Natasha Roach is a 55 year old AAF with a history cocaine abuse. She suffered an MI in 2004 felt secondary to cocaine induced vasospasm. She had been recently admitted in September 2008 with chest pain and mild ST changes and at that time did not undergo cath.  According to daughter she has been using crack recently almost daily.  Tonight after using crack she developed severe 10/10 chest pain prompting EMS evealuation. A code STEMI was activated in the field and initial ECG showed significant ST elevation anteriorly as well as inferiorly with mild early ST elevation in I and aVL. [facility] and was uiltimately sedated with haldol 10 mg.  I saw her upon arrival in the ER, and she was still complaining of 10/10 chest pain but was less agitated due to haldol. ECG in the ER showed improvement in the inferior STE but persistent anterior/anterolateral marked STE. I discussed with her the need for emergent cath and ws able to obtain consent. She was given fluid bolus with decreased BP to 75 systolic and heparin 4000 units and following my evaluation was transported to cath lab for emergent cath/possible PCI. See my cath report for details of anatomy. She underwent PCI to totally occluded large LAD which when opened wrapped around the LV apex to supply up to the mid portion of the inferior wall accounting for the initial STE inferiorly also seen on the ECG. She developed reperfusion arrythmia VT requiring shock in the lab. I stressed the impoortance of medication com-liance and dc cocaine with both patient and her  daughter. Agree with above.    Lennette Biharihomas A. Efrata Brunner, MD, [facility] Center[facility] 04/21/2016 10:30 pm

## 2016-04-21 NOTE — ED Notes (Signed)
EDP ordered nitro paste however pt was hypotensive. EMS unable to give nitro in the field due to the patient being combative. Fluids were ordered and same was started. Nitro withheld due to a low blood pressure.

## 2016-04-21 NOTE — ED Provider Notes (Addendum)
[facility]-EMERGENCY DEPT Provider Note   CSN: [id number] Arrival date & time: 04/21/16 1931     History    Chief Complaint  Patient presents with  . Chest Pain    STEMI     HPI Natasha Roach is a 55 y.o. female.  55yo F w/ PMH including cocaine-induced MI, substance abuse who p/w chest pain. Hx Obtained primarily by EMS due to patient's altered mental status. They report that this evening just prior to arrival, the patient's husband stated that she used crack cocaine and immediately afterwards began having severe central chest pain. They called EMS and on arrival she was very combative and required 10 mg Haldol and 5 mg Versed due to agitation. Initial EKG was concerning for STEMI and STEMI alert was called. She was given 325 mg aspirin in route. She continues to complain of severe chest pain.  LEVEL 5 CAVEAT DUE TO AMS   Past Medical History:  Diagnosis Date  . MI (myocardial infarction)    2004 cocaine induced  . Substance abuse      Patient Active Problem List   Diagnosis Date Noted  . Morbid obesity (HCC) 01/01/2016  . Acute coronary syndrome (HCC) 12/31/2015  . Cocaine abuse 12/31/2015  . Leukocytosis 12/31/2015    Past Surgical History:  Procedure Laterality Date  . ABDOMINAL HYSTERECTOMY    . BREAST SURGERY     abcess  . CARDIAC CATHETERIZATION  2004    OB History    No data available        Home Medications    Prior to Admission medications   Medication Sig Start Date End Date Taking? Authorizing Provider  amLODipine (NORVASC) 2.5 MG tablet Take 1 tablet (2.5 mg total) by mouth daily. 01/02/16   Albertine Grates, MD  aspirin EC 81 MG EC tablet Take 1 tablet (81 mg total) by mouth daily. 01/02/16   Albertine Grates, MD  atorvastatin (LIPITOR) 80 MG tablet Take 1 tablet (80 mg total) by mouth daily at 6 PM. 01/02/16   Albertine Grates, MD  cephALEXin (KEFLEX) 500 MG capsule Take 1 capsule (500 mg total) by mouth 2 (two) times daily. 01/02/16   Albertine Grates, MD  fenofibrate (TRICOR)  145 MG tablet Take 1 tablet (145 mg total) by mouth daily. 01/02/16   Albertine Grates, MD  fluconazole (DIFLUCAN) 150 MG tablet Take 1 tablet (150 mg total) by mouth daily. As needed for yeast infection. 01/02/16   Albertine Grates, MD  metFORMIN (GLUCOPHAGE) 500 MG tablet Take 1 tablet (500 mg total) by mouth 2 (two) times daily with a meal. 01/02/16   Albertine Grates, MD  metroNIDAZOLE (FLAGYL) 500 MG tablet Take 1 tablet (500 mg total) by mouth every 8 (eight) hours. 01/02/16   Albertine Grates, MD  nitroGLYCERIN (NITROSTAT) 0.4 MG SL tablet Place 1 tablet (0.4 mg total) under the tongue every 5 (five) minutes x 3 doses as needed for chest pain. 01/02/16   Albertine Grates, MD      No family history on file.   Social History  Substance Use Topics  . Smoking status: Current Every Day Smoker    Packs/day: 1.00    Years: 40.00    Types: Cigarettes  . Smokeless tobacco: Never Used  . Alcohol use No     Allergies     Patient has no known allergies.    Review of Systems  10 Systems reviewed and are negative for acute change except as noted in the HPI.   Physical  Exam Updated Vital Signs BP 101/60 (BP Location: Right Arm)   Pulse 65   Temp 97.4 F (36.3 C) (Oral)   Resp 18   Ht 5\' 5"  (1.651 m)   Wt 220 lb (99.8 kg)   SpO2 (!) 88%   BMI 36.61 kg/m   Physical Exam  Constitutional: She appears well-developed and well-nourished. No distress.  somnolent  HENT:  Head: Normocephalic and atraumatic.  Eyes: Pupils are equal, round, and reactive to light.  Conjunctival injection b/l  Neck: Neck supple.  Cardiovascular: Normal rate, regular rhythm and normal heart sounds.   No murmur heard. Pulmonary/Chest: Effort normal and breath sounds normal.  Abdominal: Soft. Bowel sounds are normal. She exhibits no distension. There is no tenderness.  Musculoskeletal: She exhibits no edema.  Neurological:  Somnolent, arouses with effort, moving all 4 extremities  Skin: Skin is warm and dry.  Nursing note and vitals  reviewed.     ED Treatments / Results  Labs (all labs ordered are listed, but only abnormal results are displayed) Labs Reviewed  CBG MONITORING, ED - Abnormal; Notable for the following:       Result Value   Glucose-Capillary 209 (*)    All other components within normal limits  CBC  DIFFERENTIAL  PROTIME-INR  APTT  COMPREHENSIVE METABOLIC PANEL  TROPONIN I  LIPID PANEL  RAPID URINE DRUG SCREEN, HOSP PERFORMED  ETHANOL     EKG  EKG Interpretation  Date/Time:    Ventricular Rate:    PR Interval:    QRS Duration:   QT Interval:    QTC Calculation:   R Axis:     Text Interpretation:           Radiology No results found.  Procedures Procedures (including critical care time) .Critical Care Performed by: Laurence SpatesLITTLE, RACHEL MORGAN Authorized by: Laurence SpatesLITTLE, RACHEL MORGAN   Critical care provider statement:    Critical care time (minutes):  30   Critical care time was exclusive of:  Separately billable procedures and treating other patients   Critical care was necessary to treat or prevent imminent or life-threatening deterioration of the following conditions:  Cardiac failure   Critical care was time spent personally by me on the following activities:  Discussions with consultants, examination of patient, obtaining history from patient or surrogate, ordering and performing treatments and interventions, ordering and review of laboratory studies, re-evaluation of patient's condition and review of old charts     Medications Ordered in ED  Medications  0.9 %  sodium chloride infusion (not administered)  heparin injection 4,000 Units (not administered)     Initial Impression / Assessment and Plan / ED Course  I have reviewed the triage vital signs and the nursing notes.  Pertinent labs & imaging results that were available during my care of the patient were reviewed by me and considered in my medical decision making (see chart for details).  Clinical Course     PT .In by EMS as STEMI alert after she began having chest pain immediately after using crack cocaine. ST elevation noted on EMS EKG. On arrival here, she was somnolent secondary to doses of Haldol and versed from EMS because of combativeness. She was borderline hypotensive, gave IVF bolus. She had already received ASA324mg . EKG showed significant ST elevation in precordial leads.  Pt given heparin bolus for ACS. Pt evaluated by Dr. Tresa EndoKelly, who took her emergently to cath lab for intervention. I attempted to contact the patient's significant other by phone without success.  Final Clinical Impressions(s) / ED Diagnoses   Final diagnoses:  ST elevation myocardial infarction (STEMI), unspecified artery (HCC)  Cocaine abuse     New Prescriptions   No medications on file       Laurence Spatesachel Morgan Little, MD 04/22/16 [id number]    Laurence Spatesachel Morgan Little, MD 04/22/16 [id number]

## 2016-04-21 NOTE — ED Triage Notes (Signed)
[facility] reports pt called 9-1-1 for chest pains after smoking crack cocaine at approximately 1830 this date. EMS gavr 324 ASA, 5 VERSED, 10 HALDOL, and 18 g SALINE LOCK in Left AC. EMS reports a STEMI.

## 2016-04-22 ENCOUNTER — Inpatient Hospital Stay (HOSPITAL_COMMUNITY): Payer: Self-pay

## 2016-04-22 ENCOUNTER — Encounter (HOSPITAL_COMMUNITY): Payer: Self-pay | Admitting: Cardiovascular Disease

## 2016-04-22 DIAGNOSIS — E785 Hyperlipidemia, unspecified: Secondary | ICD-10-CM

## 2016-04-22 DIAGNOSIS — I255 Ischemic cardiomyopathy: Secondary | ICD-10-CM

## 2016-04-22 DIAGNOSIS — Z720 Tobacco use: Secondary | ICD-10-CM

## 2016-04-22 DIAGNOSIS — I213 ST elevation (STEMI) myocardial infarction of unspecified site: Secondary | ICD-10-CM

## 2016-04-22 DIAGNOSIS — I2510 Atherosclerotic heart disease of native coronary artery without angina pectoris: Secondary | ICD-10-CM

## 2016-04-22 DIAGNOSIS — F1410 Cocaine abuse, uncomplicated: Secondary | ICD-10-CM

## 2016-04-22 LAB — BASIC METABOLIC PANEL
Anion gap: 6 (ref 5–15)
BUN: 7 mg/dL (ref 6–20)
CO2: 22 mmol/L (ref 22–32)
Calcium: 8.5 mg/dL — ABNORMAL LOW (ref 8.9–10.3)
Chloride: 112 mmol/L — ABNORMAL HIGH (ref 101–111)
Creatinine, Ser: 0.77 mg/dL (ref 0.44–1.00)
GFR calc Af Amer: >60 mL/min (ref >60)
GFR calc non Af Amer: >60 mL/min (ref >60)
Glucose, Bld: 140 mg/dL — ABNORMAL HIGH (ref 65–99)
Potassium: 4.4 mmol/L (ref 3.5–5.1)
Sodium: 140 mmol/L (ref 135–145)

## 2016-04-22 LAB — ECHOCARDIOGRAM COMPLETE
HEIGHTINCHES: 65 in
WEIGHTICAEL: 3520 [oz_av]

## 2016-04-22 LAB — TROPONIN I
TROPONIN I: 2.85 ng/mL — AB (ref <0.03)
Troponin I: 1.32 ng/mL (ref <0.03)

## 2016-04-22 LAB — CBC
HCT: 41.9 % (ref 36.0–46.0)
Hemoglobin: 13.6 g/dL (ref 12.0–15.0)
MCH: 27.2 pg (ref 26.0–34.0)
MCHC: 32.5 g/dL (ref 30.0–36.0)
MCV: 83.8 fL (ref 78.0–100.0)
PLATELETS: 214 10*3/uL (ref 150–400)
RBC: 5 MIL/uL (ref 3.87–5.11)
RDW: 13.1 % (ref 11.5–15.5)
WBC: 17.9 10*3/uL — ABNORMAL HIGH (ref 4.0–10.5)

## 2016-04-22 LAB — HEMOGLOBIN A1C
HEMOGLOBIN A1C: 7.6 % — AB (ref 4.8–5.6)
MEAN PLASMA GLUCOSE: 171 mg/dL

## 2016-04-22 LAB — POCT I-STAT, CHEM 8
BUN: 11 mg/dL (ref 6–20)
CALCIUM ION: 1.11 mmol/L — AB (ref 1.15–1.40)
CREATININE: 0.5 mg/dL (ref 0.44–1.00)
Chloride: 99 mmol/L — ABNORMAL LOW (ref 101–111)
GLUCOSE: 178 mg/dL — AB (ref 65–99)
HCT: 39 % (ref 36.0–46.0)
HEMOGLOBIN: 13.3 g/dL (ref 12.0–15.0)
POTASSIUM: 3 mmol/L — AB (ref 3.5–5.1)
Sodium: 134 mmol/L — ABNORMAL LOW (ref 135–145)
TCO2: 19 mmol/L (ref 0–100)

## 2016-04-22 LAB — LIPID PANEL
CHOL/HDL RATIO: 5 ratio
Cholesterol: 165 mg/dL (ref 0–200)
HDL: 33 mg/dL — ABNORMAL LOW (ref >40)
LDL CALC: 97 mg/dL (ref 0–99)
Triglycerides: 176 mg/dL — ABNORMAL HIGH (ref <150)
VLDL: 35 mg/dL (ref 0–40)

## 2016-04-22 LAB — GLUCOSE, CAPILLARY
GLUCOSE-CAPILLARY: 182 mg/dL — AB (ref 65–99)
Glucose-Capillary: 126 mg/dL — ABNORMAL HIGH (ref 65–99)
Glucose-Capillary: 165 mg/dL — ABNORMAL HIGH (ref 65–99)

## 2016-04-22 LAB — HEPATIC FUNCTION PANEL
ALK PHOS: 73 U/L (ref 38–126)
ALT: 18 U/L (ref 14–54)
AST: 28 U/L (ref 15–41)
Albumin: 3 g/dL — ABNORMAL LOW (ref 3.5–5.0)
BILIRUBIN TOTAL: 0.7 mg/dL (ref 0.3–1.2)
TOTAL PROTEIN: 5.1 g/dL — AB (ref 6.5–8.1)

## 2016-04-22 LAB — POCT ACTIVATED CLOTTING TIME: ACTIVATED CLOTTING TIME: 571 s

## 2016-04-22 LAB — PATHOLOGIST SMEAR REVIEW: PATH REVIEW: REACTIVE

## 2016-04-22 MED ORDER — ISOSORBIDE MONONITRATE ER 30 MG PO TB24
30.0000 mg | ORAL_TABLET | Freq: Every day | ORAL | Status: DC
  Administered 2016-04-22: 30 mg via ORAL
  Filled 2016-04-22: qty 1

## 2016-04-22 MED ORDER — LISINOPRIL 2.5 MG PO TABS
2.5000 mg | ORAL_TABLET | Freq: Every day | ORAL | Status: DC
  Administered 2016-04-22 – 2016-04-23 (×2): 2.5 mg via ORAL
  Filled 2016-04-22 (×2): qty 1

## 2016-04-22 MED ORDER — CLOPIDOGREL BISULFATE 75 MG PO TABS
75.0000 mg | ORAL_TABLET | Freq: Every day | ORAL | Status: DC
  Administered 2016-04-23: 75 mg via ORAL
  Filled 2016-04-22: qty 1

## 2016-04-22 MED ORDER — ATROPINE SULFATE 1 MG/10ML IJ SOSY
PREFILLED_SYRINGE | INTRAMUSCULAR | Status: AC
  Filled 2016-04-22: qty 10

## 2016-04-22 MED ORDER — CAPTOPRIL 6.25 MG HALF TABLET
6.2500 mg | ORAL_TABLET | Freq: Three times a day (TID) | ORAL | Status: DC
  Filled 2016-04-22: qty 1

## 2016-04-22 MED FILL — Lidocaine HCl Local Preservative Free (PF) Inj 1%: INTRAMUSCULAR | Qty: 30 | Status: AC

## 2016-04-22 MED FILL — Heparin Sodium (Porcine) 2 Unit/ML in Sodium Chloride 0.9%: INTRAMUSCULAR | Qty: 500 | Status: AC

## 2016-04-22 NOTE — Progress Notes (Signed)
Responded to page for STEMI that quickly became emergent. Pt  transferred to Cath Lab. Only pt's fiancee was in waiting rm. Since he  could not give consent, he left to retrieve pt's daughter. When she arrived, advised med staff in Cath lab, brought daughter and fiancee to Cath lab waiting rm. Doctor spoke bluntly to daughter that her mother had almost died due to cocaine-induced heart attack (she had previous such heart attack 10+ yrs ago), describing extent of massive problem, intervention, and amount of med drugs her mom now must take -- and that she had to stop using cocaine or it would kill her, as she was very lucky it had not tonight. Daughter understood. Took daughter and fiancee to 4N waiting rm, found nurse just advised of transfer, introduced them. Provided emotional/spiritual support and prayer to daughter and fiancee (who especially appreciated latter). Both seemed overwhelmed by magnitude of responsibility they now felt to get pt to stop using cocaine. Chaplain available for f/u.   04/21/16 2200  Clinical Encounter Type  Visited With Family  Visit Type Initial;Follow-up;Psychological support;Spiritual support;Social support;ED  Referral From Nurse  Spiritual Encounters  Spiritual Needs Prayer;Emotional  Stress Factors  Patient Stress Factors Health changes;Other (Comment) (Cocaine use that led to this & past heart attacks)  Family Stress Factors Family relationships;Health changes;Loss of control;Other (Comment) (Mom's drug use)   Ephraim Hamburgerynthia A Kari Montero, Chaplain

## 2016-04-22 NOTE — Progress Notes (Signed)
Sheath removed from right groin by Owens LofflerKaziah Miller, RN at [phone]. Pressure held manually for 20 minutes and hemostasis was achieved at 0427. Gauze dressing applied to site. Site level 0 at time of hemostasis. PAtient tolerated very well. VSS. PAtient educated on bedrest for remaining 6 hours and importance to keep right leg straight. PAtient verbalized understanding.

## 2016-04-22 NOTE — Progress Notes (Signed)
Subjective:  Day 1 s/p cocaine induced anterior STEMI. No recurrent chest pain.  Objective:   Vital Signs : Vitals:   04/22/16 0427 04/22/16 0515 04/22/16 0600 04/22/16 0700  BP: 115/76 133/71 100/75 123/76  Pulse: 64 65 74 (!) 59  Resp: _0 (!) 21  Temp:  98.6 F (37 C)    TempSrc:  Oral    SpO2: 100% 99% 98% 98%  Weight:      Height:        Intake/Output from previous day:  Intake/Output Summary (Last 24 hours) at 04/22/16 0843 Last data filed at 04/22/16 0700  Gross per 24 hour  Intake          1589.26 ml  Output             1100 ml  Net           489.26 ml    I/O since admission: + 489  Wt Readings from Last 3 Encounters:  04/21/16 220 lb (99.8 kg)  01/02/16 204 lb 11.2 oz (92.9 kg)  09/07/12 212 lb (96.2 kg)    Medications: . aspirin  81 mg Oral Daily  . atorvastatin  80 mg Oral q1800  . captopril  6.25 mg Oral TID  . clopidogrel  600 mg Oral Once  . heparin  5,000 Units Subcutaneous Q8H  . insulin aspart  0-15 Units Subcutaneous TID WC  . insulin aspart  0-5 Units Subcutaneous QHS  . sodium chloride flush  3 mL Intravenous Q12H  . ticagrelor  90 mg Oral BID    . sodium chloride 1,000 mL/hr (04/21/16 2017)  . sodium chloride 100 mL/hr at 04/22/16 0700  . nitroGLYCERIN 20 mcg/min (04/22/16 0700)    Physical Exam:   General appearance: alert, cooperative and no distress Neck: no adenopathy, no carotid bruit, no JVD, supple, symmetrical, trachea midline and thyroid not enlarged, symmetric, no tenderness/mass/nodules Lungs: clear to auscultation bilaterally Heart: regular rate and rhythm and 1/6 systolic murmur; no s3; no rub Abdomen: soft, non-tender; bowel sounds normal; no masses,  no organomegaly Extremities: extremities normal, atraumatic, no cyanosis or edema Pulses: 2+ and symmetric; R groin site stable Skin: Skin color, texture, turgor normal. No rashes or lesions Neurologic: Grossly normal   Rate: 60  Rhythm: normal sinus  rhythm  ECG (independently read by me): NSR at 65 with complete resolution of previous diffuse ST elevations anteriorly and inferiorly;  Lab Results:   Recent Labs  04/21/16 1945 04/22/16 0556  NA 140 140  K 3.5 4.4  CL 111 112*  CO2 21* 22  GLUCOSE 206* 140*  BUN 9 7  CREATININE 0.81 0.77  CALCIUM 8.3* 8.5*    Hepatic Function Latest Ref Rng & Units 04/22/2016 04/21/2016 12/31/2015  Total Protein 6.5 - 8.1 g/dL 5.1(L) 5.1(L) 5.2(L)  Albumin 3.5 - 5.0 g/dL 3.0(L) 3.1(L) 3.4(L)  AST 15 - 41 U/L _1 ALT 14 - 54 U/L _2 Alk Phosphatase 38 - 126 U/L 73 76 96  Total Bilirubin 0.3 - 1.2 mg/dL 0.7 0.6 0.5  Bilirubin, Direct 0.1 - 0.5 mg/dL <0.1(L) - <0.1(L)     Recent Labs  04/21/16 1945 04/22/16 0556  WBC 23.0* 17.9*  NEUTROABS 15.0*  --   HGB 14.5 13.6  HCT 44.2 41.9  MCV 84.7 83.8  PLT 229 214     Recent Labs  04/21/16 1945 04/22/16 0036 04/22/16 0556  TROPONINI <0.03 1.32* 2.85*    Lab  Results  Component Value Date   TSH 0.898 04/21/2016   No results for input(s): HGBA1C in the last 72 hours.   Recent Labs  04/21/16 1945 04/22/16 0556  PROT 5.1* 5.1*  ALBUMIN 3.1* 3.0*  AST 15 28  ALT 15 18  ALKPHOS 76 73  BILITOT 0.6 0.7  BILIDIR  --  <0.1*  IBILI  --  NOT CALCULATED    Recent Labs  04/21/16 1945  INR 1.06   BNP (last 3 results) No results for input(s): BNP in the last 8760 hours.  ProBNP (last 3 results) No results for input(s): PROBNP in the last 8760 hours.   Lipid Panel     Component Value Date/Time   CHOL 165 04/22/2016 0556   TRIG 176 (H) 04/22/2016 0556   HDL 33 (L) 04/22/2016 0556   CHOLHDL 5.0 04/22/2016 0556   VLDL 35 04/22/2016 0556   LDLCALC 97 04/22/2016 0556      Imaging:  Heart Cath and Coronary Angiography  Conclusion     There is mild to moderate left ventricular systolic dysfunction.  LV end diastolic pressure is mildly elevated.  Mid LAD-1 lesion, 20 %stenosed.  A STENT SYNERGY  DES 3.5X20 drug eluting stent was successfully placed.  Mid LAD-2 lesion, 100 %stenosed.  Post intervention, there is a 0% residual stenosis.   Acute ST segment elevation myocardial infarction occurring shortly after crack cocaine use, secondary to total occlusion of the proximal LAD with initial TIMI 0 flow and no evidence for collateralization.  Normal left circumflex and small right coronary arteries.  Reperfusion induced ventricular tachycardia requiring cardioversion/defibrillation and amiodarone 300 mg bolus.  Successful PCI to the totally occluded LAD with ultimate insertion of a Synergy 3.520 mm DES stent postdilated to 3.78 mm with the 100% occlusion being reduced to 0% and resumption of TIMI-3 flow in a very large LAD vessel which wrapped around the apex and supplied up to the midportion of the inferior wall.  RECOMMENDATION: The patient will continue on dual antiplatelet therapy ideally for a minimum of 1 year following this ST segment elevation MI.  A Synergy DES stent was used in the event she has reduced medical compliance in her large LAD vessel.  A lengthy discussion was done concerning the importance of complete discontinuance of cocaine use and the importance of continuation of medical therapy.       Post-Intervention Diagram         Assessment/Plan:   Active Problems:   Cocaine abuse   Morbid obesity (HCC)   Acute ST elevation myocardial infarction (STEMI) due to occlusion of left coronary artery (HCC)   HTN (hypertension)   Diabetes type 2, controlled (HCC)   Tobacco abuse   Ventricular tachycardia, sustained (HCC)   Acute ST elevation myocardial infarction (STEMI) involving left anterior descending (LAD) coronary artery ([place])  1. Day 1 s/p STEMI  Treated with Syngery stent to LAD with early reperfusion; ECG today shows resolution of STT changes 2. S/P Reperfusion induced VT; now off amiodarone without recurrent arrythmia 3. Cardiomyopathy; EF 40% at  cath;  hopefully LV function will continue to improve 4. Cocaine abuse; I had another long discussion with patient again this am concerning the absolute importance of discontinuing 5. Tobacco abuse; discussed cessation  6. DM2 7. Hyperlipidemia  Will initiate ACE-I inhibition today with lisinopril at 2.5 mg and titrate tomorrow to 5 mg (will not start captopril with need for q 8 hr dosing to improve compliance). Will change brilinta to plavix  for qd dosing and cost.   Consider adding carvedilol for BB which has alpha blockade;  Consider adding spironolactone if LV does not significantly improve once ACE-I is further titrated.    Time spent: 45 minutes.  Troy Sine, MD, [facility] 04/22/2016, 8:43 AM

## 2016-04-22 NOTE — Progress Notes (Signed)
CARDIAC REHAB PHASE I   PRE:  Rate/Rhythm: 71 SR  BP:  Supine: 128/72  Sitting:   Standing:    SaO2: 99%RA  MODE:  Ambulation: 140 ft   POST:  Rate/Rhythm: 79 SR  BP:  Supine: 143/70  Sitting:   Standing:    SaO2: 98%RA 1320-1412 Pt walked 140 ft on RA with asst x 1 with some lightheadedness. To bed after walk. No c/o CP. Began ed with pt but she is so sleepy that she slept off and on while I tried to ed. Discussed smoking cessation but pt did not know how she was going to quit. Did not want fake cigarette. Told pt she needs to have a plan to quit. Stated has been smoking 40 years. Discussed stent and importance of plavix. Gave stent card. Discussed CRP 2 and pt stated she would like to attend. Gave financial aid form for CRP 2 and will refer to [place]. Left diet info and smoking cessation handout. Pt will need re enforcement of ed. Will follow up tomorrow.   Luetta Nuttingharlene Marissa Lowrey, RN BSN  04/22/2016 2:07 PM

## 2016-04-22 NOTE — Progress Notes (Deleted)
Patient Name: Natasha Roach Date of Encounter: 04/22/2016  [facility] Cardiologist[facility]   Hospital Problem List     Active Problems:   Cocaine abuse   Morbid obesity (HCC)   Acute ST elevation myocardial infarction (STEMI) due to occlusion of left coronary artery (HCC)   HTN (hypertension)   Diabetes type 2, controlled (HCC)   Tobacco abuse   Ventricular tachycardia, sustained (HCC)   Acute ST elevation myocardial infarction (STEMI) involving left anterior descending (LAD) coronary artery (HCC)     Subjective   Feels tired this am, no chest pain or SOB.   Inpatient Medications    Scheduled Meds: . aspirin  81 mg Oral Daily  . atorvastatin  80 mg Oral q1800  . captopril  6.25 mg Oral TID  . clopidogrel  600 mg Oral Once  . heparin  5,000 Units Subcutaneous Q8H  . insulin aspart  0-15 Units Subcutaneous TID WC  . insulin aspart  0-5 Units Subcutaneous QHS  . sodium chloride flush  3 mL Intravenous Q12H  . ticagrelor  90 mg Oral BID   Continuous Infusions: . sodium chloride 1,000 mL/hr (04/21/16 2017)  . sodium chloride 100 mL/hr at 04/22/16 [id number]  . nitroGLYCERIN 20 mcg/min (04/22/16 [id number])   PRN Meds: sodium chloride, acetaminophen, ALPRAZolam, ondansetron (ZOFRAN) IV, sodium chloride flush   Vital Signs    Vitals:   04/22/16 0424 04/22/16 0427 04/22/16 0515 04/22/16 0600  BP: 131/72 115/76 133/71 100/75  Pulse: 61 64 65 74  Resp: 17 15 19 19   Temp:   98.6 F (37 C)   TempSrc:   Oral   SpO2: 99% 100% 99% 98%  Weight:      Height:        Intake/Output Summary (Last 24 hours) at 04/22/16 0650 Last data filed at 04/22/16 [id number]  Gross per 24 hour  Intake          1506.23 ml  Output             1100 ml  Net           406.23 ml   Filed Weights   04/21/16 1945  Weight: 220 lb (99.8 kg)    Physical Exam   GEN: Well nourished, well developed, in no acute distress.  HEENT: Grossly normal.  Neck: Supple, no JVD, carotid bruits, or  masses. Cardiac: RRR, no murmurs, rubs, or gallops. No clubbing, cyanosis, edema.  Radials/DP/PT 2+ and equal bilaterally.  Respiratory:  Respirations regular and unlabored, clear to auscultation bilaterally. GI: Soft, nontender, nondistended, BS + x 4. MS: no deformity or atrophy. Skin: warm and dry, no rash. Neuro:  Strength and sensation are intact. Psych: AAOx3.  Normal affect.  Labs    CBC  Recent Labs  04/21/16 1945 04/22/16 0556  WBC 23.0* 17.9*  NEUTROABS 15.0*  --   HGB 14.5 13.6  HCT 44.2 41.9  MCV 84.7 83.8  PLT 229 214   Basic Metabolic Panel  Recent Labs  04/21/16 1945  NA 140  K 3.5  CL 111  CO2 21*  GLUCOSE 206*  BUN 9  CREATININE 0.81  CALCIUM 8.3*   Liver Function Tests  Recent Labs  04/21/16 1945  AST 15  ALT 15  ALKPHOS 76  BILITOT 0.6  PROT 5.1*  ALBUMIN 3.1*   Cardiac Enzymes  Recent Labs  04/21/16 1945 04/22/16 0036  TROPONINI <0.03 1.32*   Fasting Lipid Panel  Recent Labs  04/21/16 1945  CHOL 170  HDL 35*  LDLCALC 90  TRIG 045223*  CHOLHDL 4.9   Thyroid Function Tests  Recent Labs  04/21/16 2206  TSH 0.898    Telemetry   NSR - Personally Reviewed  ECG    NSR, ST elevation in precordial leads  - Personally Reviewed  Radiology    No results found.  Cardiac Studies   Left heart Cath 04/21/16  There is mild to moderate left ventricular systolic dysfunction.  LV end diastolic pressure is mildly elevated.  Mid LAD-1 lesion, 20 %stenosed.  A STENT SYNERGY DES 3.5X20 drug eluting stent was successfully placed.  Mid LAD-2 lesion, 100 %stenosed.  Post intervention, there is a 0% residual stenosis.   Acute ST segment elevation myocardial infarction occurring shortly after crack cocaine use, secondary to total occlusion of the proximal LAD with initial TIMI 0 flow and no evidence for collateralization.  Normal left circumflex and small right coronary arteries.  Reperfusion induced ventricular  tachycardia requiring cardioversion/defibrillation and amiodarone 300 mg bolus.  Successful PCI to the totally occluded LAD with ultimate insertion of a Synergy 3.520 mm DES stent postdilated to 3.78 mm with the 100% occlusion being reduced to 0% and resumption of TIMI-3 flow in a very large LAD vessel which wrapped around the apex and supplied up to the midportion of the inferior wall.  RECOMMENDATION: The patient will continue on dual antiplatelet therapy ideally for a minimum of 1 year following this ST segment elevation MI.  A Synergy DES stent was used in the event she has reduced medical compliance in her large LAD vessel.  A lengthy discussion was done concerning the importance of complete discontinuance of cocaine use and the importance of continuation of medical therapy.   Patient Profile     55 year old female with a past medical history of cocaine induced MI in 2004 (no PCI) and ongoing crack cocaine abuse. She presented to the ED on 04/21/16 with chest pain after using cocaine. EKG showed ST elevation in the anterolateral leads, CODE STEMI was activated and she was taken urgently to the cath lab.   Assessment & Plan    1. STEMI: Full cath report above. S/p DES to proximal LAD, with resumption of TIMI 3 flow.   She will be on DAPT for one year, with Plavix. Likely will avoid Brilinta due to cost.   She had VT in the cath lab, s/p DCCV. No VT overnight. Groin site stable.   Will use Coreg with cocaine use. Start on high intensity statin.   2. Ventricular tachycardia: No VT overnight, continue Amiodarone.   3. DM: Last A1c was 7.0. Continue sliding scale.   Signed, Little IshikawaErin E Smith, NP  04/22/2016, 6:50 AM

## 2016-04-23 ENCOUNTER — Encounter (HOSPITAL_COMMUNITY): Payer: Self-pay | Admitting: Physician Assistant

## 2016-04-23 DIAGNOSIS — I2510 Atherosclerotic heart disease of native coronary artery without angina pectoris: Secondary | ICD-10-CM

## 2016-04-23 DIAGNOSIS — Z9119 Patient's noncompliance with other medical treatment and regimen: Secondary | ICD-10-CM

## 2016-04-23 DIAGNOSIS — Z91199 Patient's noncompliance with other medical treatment and regimen due to unspecified reason: Secondary | ICD-10-CM

## 2016-04-23 DIAGNOSIS — R9431 Abnormal electrocardiogram [ECG] [EKG]: Secondary | ICD-10-CM

## 2016-04-23 HISTORY — DX: Abnormal electrocardiogram (ECG) (EKG): R94.31

## 2016-04-23 HISTORY — DX: Atherosclerotic heart disease of native coronary artery without angina pectoris: I25.10

## 2016-04-23 LAB — BASIC METABOLIC PANEL
ANION GAP: 7 (ref 5–15)
BUN: 7 mg/dL (ref 6–20)
CHLORIDE: 107 mmol/L (ref 101–111)
CO2: 25 mmol/L (ref 22–32)
Calcium: 8.9 mg/dL (ref 8.9–10.3)
Creatinine, Ser: 0.9 mg/dL (ref 0.44–1.00)
GFR calc non Af Amer: >60 mL/min (ref >60)
Glucose, Bld: 172 mg/dL — ABNORMAL HIGH (ref 65–99)
Potassium: 4.1 mmol/L (ref 3.5–5.1)
Sodium: 139 mmol/L (ref 135–145)

## 2016-04-23 LAB — GLUCOSE, CAPILLARY
GLUCOSE-CAPILLARY: 186 mg/dL — AB (ref 65–99)
Glucose-Capillary: 138 mg/dL — ABNORMAL HIGH (ref 65–99)

## 2016-04-23 LAB — MAGNESIUM: Magnesium: 2.2 mg/dL (ref 1.7–2.4)

## 2016-04-23 MED ORDER — LISINOPRIL 2.5 MG PO TABS: 2.5000 mg | ORAL_TABLET | Freq: Every day | ORAL | 6 refills | Status: DC

## 2016-04-23 MED ORDER — ISOSORBIDE MONONITRATE ER 60 MG PO TB24
60.0000 mg | ORAL_TABLET | Freq: Every day | ORAL | Status: DC
  Administered 2016-04-23: 60 mg via ORAL
  Filled 2016-04-23: qty 1

## 2016-04-23 MED ORDER — CLOPIDOGREL BISULFATE 75 MG PO TABS: 75.0000 mg | ORAL_TABLET | Freq: Every day | ORAL | 11 refills | Status: DC

## 2016-04-23 MED ORDER — MAGNESIUM SULFATE 2 GM/50ML IV SOLN
2.0000 g | Freq: Once | INTRAVENOUS | Status: AC
  Administered 2016-04-23: 2 g via INTRAVENOUS
  Filled 2016-04-23: qty 50

## 2016-04-23 MED ORDER — ISOSORBIDE MONONITRATE ER 60 MG PO TB24: 60.0000 mg | ORAL_TABLET | Freq: Every day | ORAL | 6 refills | Status: DC

## 2016-04-23 MED ORDER — ATORVASTATIN CALCIUM 80 MG PO TABS: 80.0000 mg | ORAL_TABLET | Freq: Every evening | ORAL | 6 refills | Status: DC

## 2016-04-23 MED ORDER — ASPIRIN 81 MG PO TBEC: 81.0000 mg | DELAYED_RELEASE_TABLET | Freq: Every day | ORAL | 3 refills | Status: DC

## 2016-04-23 MED ORDER — NITROGLYCERIN 0.4 MG SL SUBL: 0.4000 mg | SUBLINGUAL_TABLET | SUBLINGUAL | 3 refills | Status: DC | PRN

## 2016-04-23 NOTE — Progress Notes (Signed)
Ronie Spiesayna Dunn PA called and stated to educate patient on importance of staying and stated that she text paged Dr. Anne FuSkains to update him so he can come see patient.

## 2016-04-23 NOTE — Progress Notes (Signed)
Informed Ronie Spiesayna Dunn PA through text page that patient is questioning leaving AMA because she wants to go home. Per Patient she will wait a little bit for a doctor to discharger her if not she will Leave.

## 2016-04-23 NOTE — Progress Notes (Signed)
CRITICAL VALUE ALERT  Critical value received:  Qtc on EKG 604 Date of notification:  04/24/2015  Time of notification:  0737   Critical value read back:Yes.    Nurse who received alert:  [place], RN   MD notified (1st page):  Ronie Spiesayna Dunn PA  Time of first page:  [phone]  MD notified (2nd page):  Time of second page:  Responding MD: Ronie Spiesayna Dunn PA  Time MD responded:0757

## 2016-04-23 NOTE — Discharge Summary (Signed)
Discharge Summary    Patient ID: Natasha Roach,  MRN: [id number], DOB/AGE: [birth year] 55 y.o.  Admit date: 04/21/2016 Discharge date: 04/23/2016  Primary Care Provider: No PCP Per Patient Primary Cardiologist: Dr. Saunders Roach first saw in consult 12/2015, also followed by Dr. Claiborne Roach this admission  Discharge Diagnoses    Patient left against medical advice.  Principal Problem:   Acute ST elevation myocardial infarction (STEMI) due to occlusion of left coronary artery [facility]) Active Problems:   Cocaine abuse   Ventricular tachycardia, sustained (HCC)   CAD (coronary artery disease)   Morbid obesity ([place])   HTN (hypertension)   Diabetes type 2, controlled ([place])   Tobacco abuse   Hyperlipidemia LDL goal <70   Ischemic cardiomyopathy   Prolonged QT interval   H/O noncompliance with medical treatment, presenting hazards to health    Diagnostic Studies/Procedures     [facility] 04/21/16 Narrative & Impression      There is mild to moderate left ventricular systolic dysfunction.  LV end diastolic pressure is mildly elevated.  Mid LAD-1 lesion, 20 %stenosed.  A STENT SYNERGY DES 3.5X20 drug eluting stent was successfully placed.  Mid LAD-2 lesion, 100 %stenosed.  Post intervention, there is a 0% residual stenosis.   Acute ST segment elevation myocardial infarction occurring shortly after crack cocaine use, secondary to total occlusion of the proximal LAD with initial TIMI 0 flow and no evidence for collateralization.  Normal left circumflex and small right coronary arteries.  Reperfusion induced ventricular tachycardia requiring cardioversion/defibrillation and amiodarone 300 mg bolus.  Successful PCI to the totally occluded LAD with ultimate insertion of a Synergy 3.520 mm DES stent postdilated to 3.78 mm with the 100% occlusion being reduced to 0% and resumption of TIMI-3 flow in a very large LAD vessel which wrapped around the apex and supplied up to the midportion of  the inferior wall.  RECOMMENDATION: The patient will continue on dual antiplatelet therapy ideally for a minimum of 1 year following this ST segment elevation MI.  A Synergy DES stent was used in the event she has reduced medical compliance in her large LAD vessel.  A lengthy discussion was done concerning the importance of complete discontinuance of cocaine use and the importance of continuation of medical therapy.    2D echo 04/23/16 - Left ventricle: The cavity size was normal. Wall thickness was   increased in a pattern of mild LVH. The estimated ejection   fraction was 55%. Although no diagnostic regional wall motion   abnormality was identified, this possibility cannot be completely   excluded on the basis of this study. Doppler parameters are   consistent with abnormal left ventricular relaxation (grade 1   diastolic dysfunction). - Aortic valve: Poorly visualized. Probably trileaflet. There was   no stenosis. - Mitral valve: There was no significant regurgitation. - Right ventricle: The cavity size was normal. Systolic function   was normal. - Pulmonary arteries: PA peak pressure: 24 mm Hg (S). - Systemic veins: IVC measured 1.9 cm with < 50% respirophasic   variation, suggesting RA pressure 8 mmHg. Impressions: - Technically difficult study with poor acoustic windows. Normal LV   size with mild LV hypertrophy. EF 55%. Normal RV size and   systolic function. No significant valvular abnormalities.    _____________     History of Present Illness     Natasha Roach is a 55 y.o. 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 She was last admitted in 12/2015 with suspected cocaine-induced NSTEMI at which time the patient declined cath and she  was treated medically. Unfortunately she did not take any of her medications after discharge. This admission, she had used crack cocaine and immediately developed severe substernal chest pain with shortness of breath that prompted her to call 911. The on-scene EKG was concerning for ST elevation myocardial infarction and she was brought immediately to our emergency department. En route, she was given the medications above as well as ASA 325 mg p.o. and fluids wide open for hypotension. She also had to be medicated with Haldol 10 mg IV and Versed 5 mg IV because she was combative and trying to punch EMS crew.  Hospital Course    Upon arrival to the ED, 12-lead EKG demonstrated anterolateral ST elevations and she was given Heparin 4000 units IV and taken to the Cath Lab. Diagnostic angiogram demonstrated thrombotic occlusion of the mid left anterior descending artery shortly after the first diagonal. Shortly after initial reperfusion, the patient experienced an episode of ventricular tachycardia which degenerated into ventricular fibrillation requiring 1 defibrillation. She was given amiodarone 300 mg IV and begun on an amiodarone gtt. A Synergy stent was successfully deployed to the mid left anterior descending artery. LV gram demonstrated hypokinesis of the anterior wall and apex, EF 40%. Her amiodarone drip was discontinued yesterday without further arrhythmia. She was started on standard post MI therapy with the exception of no beta blocker given her recurrent cocaine abuse. 2D echo 04/22/16 showed mild LVH, EF 55-%, grade 1 DD, poor acoustic windows. Peak troponin was 2.85, LDL 97, UDS + benzos and cocaine, A1C 7.6, WBC 23->17.9. This morning her EKG showed significant ST-T changes and QT prolongation for which she was treated with 2g mag sulfate. These changes may be vasospastic phenomenon from her recent cocaine use. Her isosorbide was increased to '60mg'$  daily. She was completely asymptomatic. This afternoon  she is completely insistent on going home despite multiple staff members informing her of the dangers of leaving especially with ongoing dynamic EKG changes. She understands the risks of fatal arrhythmias especially in the setting of her recent myocardial infarction. She flat out refuses to stay stating that she needs to get home. Dr. Marlou Porch had  a lengthy discussion about her medications especially her Plavix and aspirin and the potential for stent thrombosis. She states that she understood this and will take her medications. She also was educated from cardiac rehab about this. She also understands that she cannot use cocaine. We sent her prescriptions to the pharmacy because we could not let her leave in good faith without these medications, but we do not feel she is stable for discharge. The patient verbalized understanding that she knows she is leaving [place].   Of note, she previously had Tricor, amlodipine and metformin on her medication list. She had not been taking any medications prior to admit. Her triglyceride level was 176 this admission. She was not started back in amlodipine. She was advised to f/u with a primary care provider for further monitoring and management of her diabetes. I have sent a message to our [facility] office's scheduler requesting a follow-up appointment, and our office will call the patient with this information (TOC visit). If the patient is tolerating statin at time of  follow-up appointment, would consider rechecking liver function/lipid panel in 6-8 weeks. _____________  Discharge Vitals Blood pressure 111/68, pulse 70, temperature 98.1 F (36.7 C), temperature source Oral, resp. rate 18, height '5\' 5"'$  (1.651 m), weight 220 lb (99.8 kg), SpO2 100 %.  Filed Weights   04/21/16 1945  Weight: 220 lb (99.8 kg)    Labs & Radiologic Studies    CBC  Recent Labs  04/21/16 1945 04/21/16 2027 04/22/16 0556  WBC 23.0*  --  17.9*  NEUTROABS 15.0*  --   --    HGB 14.5 13.3 13.6  HCT 44.2 39.0 41.9  MCV 84.7  --  83.8  PLT 229  --  035   Basic Metabolic Panel  Recent Labs  04/22/16 0556 04/23/16 0818  NA 140 139  K 4.4 4.1  CL 112* 107  CO2 22 25  GLUCOSE 140* 172*  BUN 7 7  CREATININE 0.77 0.90  CALCIUM 8.5* 8.9  MG  --  2.2   Liver Function Tests  Recent Labs  04/21/16 1945 04/22/16 0556  AST 15 28  ALT 15 18  ALKPHOS 76 73  BILITOT 0.6 0.7  PROT 5.1* 5.1*  ALBUMIN 3.1* 3.0*   Cardiac Enzymes  Recent Labs  04/21/16 1945 04/22/16 0036 04/22/16 0556  TROPONINI <0.03 1.32* 2.85*   Hemoglobin A1C  Recent Labs  04/21/16 2206  HGBA1C 7.6*   Fasting Lipid Panel  Recent Labs  04/22/16 0556  CHOL 165  HDL 33*  LDLCALC 97  TRIG 176*  CHOLHDL 5.0   Thyroid Function Tests  Recent Labs  04/21/16 2206  TSH 0.898   _____________  No results found. Disposition   Pt left AMA today in stable but guarded condition.  Follow-up Plans & Appointments    Follow-up Information    Shelva Majestic, MD Follow up.   Specialty:  Cardiology Why:  Dr. Evette Georges office will call you for your follow-up appointment. Please call the office on in 2 business days if you have not heard back from our office. Contact information: [address] [place] [address] [phone]        Primary Care Follow up.   Why:  Please follow up with primary care to help manage your diabetes.         Discharge Instructions    Amb Referral to Cardiac Rehabilitation    Complete by:  As directed    Diagnosis:   STEMI Coronary Stents     Diet - low sodium heart healthy    Complete by:  As directed    Increase activity slowly    Complete by:  As directed    No driving, lifting over 10 lbs, or sexual activity until cleared by your cardiologist. You may not return to work until cleared by your cardiologist. Keep procedure site clean & dry. If you notice increased pain, swelling, bleeding or pus, call/return!  You  may shower, but no soaking baths/hot tubs/pools for 1 week.      Discharge Medications   Allergies as of 04/23/2016   No Known Allergies     Medication List    STOP taking these medications   amLODipine 2.5 MG tablet Commonly known as:  NORVASC   fenofibrate 145 MG tablet Commonly known as:  TRICOR   metFORMIN 500 MG tablet Commonly known as:  GLUCOPHAGE     TAKE these medications   aspirin 81 MG EC tablet Take 1 tablet (81 mg total) by mouth daily.  atorvastatin 80 MG tablet Commonly known as:  LIPITOR Take 1 tablet (80 mg total) by mouth every evening. What changed:  when to take this   clopidogrel 75 MG tablet Commonly known as:  PLAVIX Take 1 tablet (75 mg total) by mouth daily. Start taking on:  04/24/2016   isosorbide mononitrate 60 MG 24 hr tablet Commonly known as:  IMDUR Take 1 tablet (60 mg total) by mouth daily. Start taking on:  04/24/2016   lisinopril 2.5 MG tablet Commonly known as:  PRINIVIL,ZESTRIL Take 1 tablet (2.5 mg total) by mouth daily. Start taking on:  04/24/2016   nitroGLYCERIN 0.4 MG SL tablet Commonly known as:  NITROSTAT Place 1 tablet (0.4 mg total) under the tongue every 5 (five) minutes as needed for chest pain. Up to 3 doses What changed:  when to take this  additional instructions        Allergies:  No Known Allergies  Aspirin prescribed at discharge?  Yes High Intensity Statin Prescribed? (Lipitor 40-'80mg'$  or Crestor 20-'40mg'$ ): Yes Beta Blocker Prescribed? No: cocaine abuse For EF <40%, was ACEI/ARB Prescribed? No: n/a ADP Receptor Inhibitor Prescribed? (i.e. Plavix etc.-Includes Medically Managed Patients): Yes For EF <40%, Aldosterone Inhibitor Prescribed? No: n/a Was EF assessed during THIS hospitalization? Yes Was Cardiac Rehab II ordered? (Included Medically managed Patients): Yes   Outstanding Labs/Studies   N/A  Duration of Discharge Encounter   Greater than 30 minutes including physician  time.  Signed, Dayna N Dunn PA-C 04/23/2016, 1:20 PM  Personally seen and examined. Agree with above.   Cardiologist: Dr. 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 No recurrent chest pain. She did however have dynamic T-wave changes on EKG. She was quite angry with nursing staff earlier today wanting to leave AMA.  Objective:   Vital Signs :       Vitals:   04/23/16 0300 04/23/16 0753 04/23/16 0830 04/23/16 1129  BP: 133/77 116/78 116/78 111/68  Pulse: 70 75 69 70  Resp:   18 18  Temp: 98.5 F (36.9 C)  98.4 F (36.9 C) 98.1 F (36.7 C)  TempSrc: Oral  Oral Oral  SpO2: 93% 98% 99% 100%  Weight:      Height:        Intake/Output from previous day:  Intake/Output Summary (Last 24 hours) at 04/23/16 1219 Last data filed at 04/23/16 0900  Gross per 24 hour  Intake              530 ml  Output              450 ml  Net               80 ml    I/O since admission: + 489     Wt Readings from Last 3 Encounters:  04/21/16 220 lb (99.8 kg)  01/02/16 204 lb 11.2 oz (92.9 kg)  09/07/12 212 lb (96.2 kg)    Medications: . aspirin  81 mg Oral Daily  . atorvastatin  80 mg Oral q1800  . clopidogrel  75 mg Oral Daily  . heparin  5,000 Units Subcutaneous Q8H  . insulin aspart  0-15 Units Subcutaneous TID WC  . insulin aspart  0-5 Units Subcutaneous QHS  . isosorbide mononitrate  60 mg Oral Daily  . lisinopril  2.5 mg Oral Daily  . sodium chloride flush  3 mL Intravenous Q12H    . sodium chloride 1,000 mL/hr (04/21/16 2017)  . sodium chloride  100 mL/hr at 04/22/16 0900    Physical Exam:   General appearance: alert, cooperative and no distress Neck: no adenopathy, no carotid bruit, no JVD, supple, symmetrical, trachea midline and thyroid not enlarged, symmetric, no tenderness/mass/nodules Lungs: clear to auscultation bilaterally Heart: regular rate and rhythm and 1/6 systolic murmur; no s3; no rub Abdomen: soft,  non-tender; bowel sounds normal; no masses,  no organomegaly Extremities: extremities normal, atraumatic, no cyanosis or edema Pulses: 2+ and symmetric; R groin site stable Skin: Skin color, texture, turgor normal. No rashes or lesions Neurologic: Grossly normal   Rate: 60  Rhythm: normal sinus rhythm  ECG (independently read by me): NSR at 65 with complete resolution of previous diffuse ST elevations anteriorly and inferiorly; however, EKG on 04/23/16 does show deep T-wave inversions dynamic on her EKG. She is not having chest pain at the time.  Lab Results:   Recent Labs (last 2 labs)    Recent Labs  04/21/16 1945 04/21/16 2027 04/22/16 0556 04/23/16 0818  NA 140 134* 140 139  K 3.5 3.0* 4.4 4.1  CL 111 99* 112* 107  CO2 21*  --  22 25  GLUCOSE 206* 178* 140* 172*  BUN '9 11 7 7  '$ CREATININE 0.81 0.50 0.77 0.90  CALCIUM 8.3*  --  8.5* 8.9  MG  --   --   --  2.2      Hepatic Function Latest Ref Rng & Units 04/22/2016 04/21/2016 12/31/2015  Total Protein 6.5 - 8.1 g/dL 5.1(L) 5.1(L) 5.2(L)  Albumin 3.5 - 5.0 g/dL 3.0(L) 3.1(L) 3.4(L)  AST 15 - 41 U/L '28 15 19  '$ ALT 14 - 54 U/L '18 15 18  '$ Alk Phosphatase 38 - 126 U/L 73 76 96  Total Bilirubin 0.3 - 1.2 mg/dL 0.7 0.6 0.5  Bilirubin, Direct 0.1 - 0.5 mg/dL <0.1(L) - <0.1(L)     Recent Labs (last 2 labs)    Recent Labs  04/21/16 1945 04/21/16 2027 04/22/16 0556  WBC 23.0*  --  17.9*  NEUTROABS 15.0*  --   --   HGB 14.5 13.3 13.6  HCT 44.2 39.0 41.9  MCV 84.7  --  83.8  PLT 229  --  214       Recent Labs (last 2 labs)    Recent Labs  04/21/16 1945 04/22/16 0036 04/22/16 0556  TROPONINI <0.03 1.32* 2.85*      Recent Labs       Lab Results  Component Value Date   TSH 0.898 04/21/2016      Recent Labs (last 2 labs)    Recent Labs  04/21/16 2206  HGBA1C 7.6*       Recent Labs (last 2 labs)    Recent Labs  04/21/16 1945 04/22/16 0556  PROT 5.1* 5.1*  ALBUMIN 3.1*  3.0*  AST 15 28  ALT 15 18  ALKPHOS 76 73  BILITOT 0.6 0.7  BILIDIR  --  <0.1*  IBILI  --  NOT CALCULATED      Recent Labs (last 2 labs)    Recent Labs  04/21/16 1945  INR 1.06     BNP (last 3 results) Recent Labs (within last 365 days)  No results for input(s): BNP in the last 8760 hours.    ProBNP (last 3 results) Recent Labs (within last 365 days)  No results for input(s): PROBNP in the last 8760 hours.     Lipid Panel  Labs (Brief)          Component  Value Date/Time   CHOL 165 04/22/2016 0556   TRIG 176 (H) 04/22/2016 0556   HDL 33 (L) 04/22/2016 0556   CHOLHDL 5.0 04/22/2016 0556   VLDL 35 04/22/2016 0556   LDLCALC 97 04/22/2016 0556        Imaging:  Heart Cath and Coronary Angiography  Conclusion     There is mild to moderate left ventricular systolic dysfunction.  LV end diastolic pressure is mildly elevated.  Mid LAD-1 lesion, 20 %stenosed.  A STENT SYNERGY DES 3.5X20 drug eluting stent was successfully placed.  Mid LAD-2 lesion, 100 %stenosed.  Post intervention, there is a 0% residual stenosis.  Acute ST segment elevation myocardial infarction occurring shortly after crack cocaine use, secondary to total occlusion of the proximal LAD with initial TIMI 0 flow and no evidence for collateralization.  Normal left circumflex and small right coronary arteries.  Reperfusion induced ventricular tachycardia requiring cardioversion/defibrillation and amiodarone 300 mg bolus.  Successful PCI to the totally occluded LAD with ultimate insertion of a Synergy 3.520 mm DES stent postdilated to 3.78 mm with the 100% occlusion being reduced to 0% and resumption of TIMI-3 flow in a very large LAD vessel which wrapped around the apex and supplied up to the midportion of the inferior wall.  RECOMMENDATION: The patient will continue on dual antiplatelet therapy ideally for a minimum of 1 year following this ST segment elevation  MI. A Synergy DES stent was used in the event she has reduced medical compliance in her large LAD vessel. A lengthy discussion was done concerning the importance of complete discontinuance of cocaine use and the importance of continuation of medical therapy.       Post-Intervention Diagram         Assessment/Plan:   Active Problems:   Cocaine abuse   Morbid obesity (HCC)   Acute ST elevation myocardial infarction (STEMI) due to occlusion of left coronary artery (HCC)   HTN (hypertension)   Diabetes type 2, controlled (HCC)   Tobacco abuse   Ventricular tachycardia, sustained (HCC)   Acute ST elevation myocardial infarction (STEMI) involving left anterior descending (LAD) coronary artery (HCC)   Hyperlipidemia LDL goal <70   Ischemic cardiomyopathy  1. Day 2 s/p STEMI  Treated with Syngery stent to LAD with early reperfusion; ECG shows resolution of STT changes. Dr. Georgina Peer changed her from relented to Plavix for daily dosing as well as cost. 2. S/P Reperfusion induced VT; now off amiodarone without recurrent arrythmia 3. Cardiomyopathy; EF 40% at cath;  hopefully LV function will continue to improve 4. Cocaine abuse;  long discussion with patient Previously concerning the absolute importance of discontinuing 5. Tobacco abuse; discussed cessation  6. DM2 7. Hyperlipidemia 8. Prolonged QT-we have empirically given her magnesium IV 2 g and make sure she was not on any offending QT prolonging agents. These changes may be vasospastic phenomenon from her recent cocaine use. I have started increased isosorbide to 60 mg.  Consider adding carvedilol for BB which has alpha blockade in the future if she demonstrates cocaine cessation;  Consider adding spironolactone if LV does not significantly improve once ACE-I is further titrated.   After lengthy discussion with her, she is insistent on going home. She understands the risks of fatal arrhythmias especially in the setting of  her recent myocardial infarction. I explained that we would like to observe her over the next 24 hours to insure that she does not have any of these dangerous arrhythmias especially with her dynamic EKG change  this morning however she flat out refuses to stay stating that she needs to get home. We also had a lengthy discussion about her medications especially her Plavix and aspirin and the potential for stent thrombosis. She states that she understood this and will take her medications. She also understands that she cannot use cocaine. Unfortunately, after several attempts for multiple staff members, she is insistent upon leaving. We will go ahead and get her prescription sent to the pharmacy.  Candee Furbish, MD

## 2016-04-23 NOTE — Progress Notes (Signed)
Cardiologist: Dr. 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 No recurrent chest pain. She did however have dynamic T-wave changes on EKG. She was quite angry with nursing staff earlier today wanting to leave AMA.  Objective:   Vital Signs : Vitals:   04/23/16 0300 04/23/16 0753 04/23/16 0830 04/23/16 1129  BP: 133/77 116/78 116/78 111/68  Pulse: 70 75 69 70  Resp:   18 18  Temp: 98.5 F (36.9 C)  98.4 F (36.9 C) 98.1 F (36.7 C)  TempSrc: Oral  Oral Oral  SpO2: 93% 98% 99% 100%  Weight:      Height:        Intake/Output from previous day:  Intake/Output Summary (Last 24 hours) at 04/23/16 1219 Last data filed at 04/23/16 0900  Gross per 24 hour  Intake              530 ml  Output              450 ml  Net               80 ml    I/O since admission: + 489  Wt Readings from Last 3 Encounters:  04/21/16 220 lb (99.8 kg)  01/02/16 204 lb 11.2 oz (92.9 kg)  09/07/12 212 lb (96.2 kg)    Medications: . aspirin  81 mg Oral Daily  . atorvastatin  80 mg Oral q1800  . clopidogrel  75 mg Oral Daily  . heparin  5,000 Units Subcutaneous Q8H  . insulin aspart  0-15 Units Subcutaneous TID WC  . insulin aspart  0-5 Units Subcutaneous QHS  . isosorbide mononitrate  60 mg Oral Daily  . lisinopril  2.5 mg Oral Daily  . sodium chloride flush  3 mL Intravenous Q12H    . sodium chloride 1,000 mL/hr (04/21/16 2017)  . sodium chloride 100 mL/hr at 04/22/16 0900    Physical Exam:   General appearance: alert, cooperative and no distress Neck: no adenopathy, no carotid bruit, no JVD, supple, symmetrical, trachea midline and thyroid not enlarged, symmetric, no tenderness/mass/nodules Lungs: clear to auscultation bilaterally Heart: regular rate and rhythm and 1/6 systolic murmur; no s3; no rub Abdomen: soft, non-tender; bowel sounds normal; no masses,  no organomegaly Extremities: extremities normal, atraumatic, no cyanosis or edema Pulses: 2+ and  symmetric; R groin site stable Skin: Skin color, texture, turgor normal. No rashes or lesions Neurologic: Grossly normal   Rate: 60  Rhythm: normal sinus rhythm  ECG (independently read by me): NSR at 65 with complete resolution of previous diffuse ST elevations anteriorly and inferiorly; however, EKG on 04/23/16 does show deep T-wave inversions dynamic on her EKG. She is not having chest pain at the time.  Lab Results:   Recent Labs  04/21/16 1945 04/21/16 2027 04/22/16 0556 04/23/16 0818  NA 140 134* 140 139  K 3.5 3.0* 4.4 4.1  CL 111 99* 112* 107  CO2 21*  --  22 25  GLUCOSE 206* 178* 140* 172*  BUN _0 CREATININE 0.81 0.50 0.77 0.90  CALCIUM 8.3*  --  8.5* 8.9  MG  --   --   --  2.2    Hepatic Function Latest Ref Rng & Units 04/22/2016 04/21/2016 12/31/2015  Total Protein 6.5 - 8.1 g/dL 5.1(L) 5.1(L) 5.2(L)  Albumin 3.5 - 5.0 g/dL 3.0(L) 3.1(L) 3.4(L)  AST 15 - 41 U/L _1 ALT 14 - 54 U/L 18  15 18  Alk Phosphatase 38 - 126 U/L 73 76 96  Total Bilirubin 0.3 - 1.2 mg/dL 0.7 0.6 0.5  Bilirubin, Direct 0.1 - 0.5 mg/dL <0.1(L) - <0.1(L)     Recent Labs  04/21/16 1945 04/21/16 2027 04/22/16 0556  WBC 23.0*  --  17.9*  NEUTROABS 15.0*  --   --   HGB 14.5 13.3 13.6  HCT 44.2 39.0 41.9  MCV 84.7  --  83.8  PLT 229  --  214     Recent Labs  04/21/16 1945 04/22/16 0036 04/22/16 0556  TROPONINI <0.03 1.32* 2.85*    Lab Results  Component Value Date   TSH 0.898 04/21/2016    Recent Labs  04/21/16 2206  HGBA1C 7.6*     Recent Labs  04/21/16 1945 04/22/16 0556  PROT 5.1* 5.1*  ALBUMIN 3.1* 3.0*  AST 15 28  ALT 15 18  ALKPHOS 76 73  BILITOT 0.6 0.7  BILIDIR  --  <0.1*  IBILI  --  NOT CALCULATED    Recent Labs  04/21/16 1945  INR 1.06   BNP (last 3 results) No results for input(s): BNP in the last 8760 hours.  ProBNP (last 3 results) No results for input(s): PROBNP in the last 8760 hours.   Lipid Panel     Component  Value Date/Time   CHOL 165 04/22/2016 0556   TRIG 176 (H) 04/22/2016 0556   HDL 33 (L) 04/22/2016 0556   CHOLHDL 5.0 04/22/2016 0556   VLDL 35 04/22/2016 0556   LDLCALC 97 04/22/2016 0556      Imaging:  Heart Cath and Coronary Angiography  Conclusion     There is mild to moderate left ventricular systolic dysfunction.  LV end diastolic pressure is mildly elevated.  Mid LAD-1 lesion, 20 %stenosed.  A STENT SYNERGY DES 3.5X20 drug eluting stent was successfully placed.  Mid LAD-2 lesion, 100 %stenosed.  Post intervention, there is a 0% residual stenosis.   Acute ST segment elevation myocardial infarction occurring shortly after crack cocaine use, secondary to total occlusion of the proximal LAD with initial TIMI 0 flow and no evidence for collateralization.  Normal left circumflex and small right coronary arteries.  Reperfusion induced ventricular tachycardia requiring cardioversion/defibrillation and amiodarone 300 mg bolus.  Successful PCI to the totally occluded LAD with ultimate insertion of a Synergy 3.520 mm DES stent postdilated to 3.78 mm with the 100% occlusion being reduced to 0% and resumption of TIMI-3 flow in a very large LAD vessel which wrapped around the apex and supplied up to the midportion of the inferior wall.  RECOMMENDATION: The patient will continue on dual antiplatelet therapy ideally for a minimum of 1 year following this ST segment elevation MI.  A Synergy DES stent was used in the event she has reduced medical compliance in her large LAD vessel.  A lengthy discussion was done concerning the importance of complete discontinuance of cocaine use and the importance of continuation of medical therapy.       Post-Intervention Diagram         Assessment/Plan:   Active Problems:   Cocaine abuse   Morbid obesity (HCC)   Acute ST elevation myocardial infarction (STEMI) due to occlusion of left coronary artery (HCC)   HTN (hypertension)    Diabetes type 2, controlled (HCC)   Tobacco abuse   Ventricular tachycardia, sustained (HCC)   Acute ST elevation myocardial infarction (STEMI) involving left anterior descending (LAD) coronary artery (HCC)   Hyperlipidemia LDL goal <  70   Ischemic cardiomyopathy  1. Day 2 s/p STEMI  Treated with Syngery stent to LAD with early reperfusion; ECG shows resolution of STT changes. Dr. Georgina Peer changed her from relented to Plavix for daily dosing as well as cost. 2. S/P Reperfusion induced VT; now off amiodarone without recurrent arrythmia 3. Cardiomyopathy; EF 40% at cath;  hopefully LV function will continue to improve 4. Cocaine abuse;  long discussion with patient Previously concerning the absolute importance of discontinuing 5. Tobacco abuse; discussed cessation  6. DM2 7. Hyperlipidemia 8. Prolonged QT-we have empirically given her magnesium IV 2 g and make sure she was not on any offending QT prolonging agents. These changes may be vasospastic phenomenon from her recent cocaine use. I have started increased isosorbide to 60 mg.  Consider adding carvedilol for BB which has alpha blockade in the future if she demonstrates cocaine cessation;  Consider adding spironolactone if LV does not significantly improve once ACE-I is further titrated.   After lengthy discussion with her, she is insistent on going home. She understands the risks of fatal arrhythmias especially in the setting of her recent myocardial infarction. I explained that we would like to observe her over the next 24 hours to insure that she does not have any of these dangerous arrhythmias especially with her dynamic EKG change this morning however she flat out refuses to stay stating that she needs to get home. We also had a lengthy discussion about her medications especially her Plavix and aspirin and the potential for stent thrombosis. She states that she understood this and will take her medications. She also understands that she cannot  use cocaine. Unfortunately, after several attempts for multiple staff members, she is insistent upon leaving. We will go ahead and get her prescription sent to the pharmacy.  Candee Furbish, MD   Candee Furbish, MD  04/23/2016, 12:19 PM

## 2016-04-23 NOTE — Progress Notes (Signed)
Stopped in to see pt for ambulation in the hallway.  Pt oob in chair on cell phone. Pt eager to go home.  Pt denies any questions regarding education she received from cardiac rehab staff on yesterday.  Pt asked for medication sheet.  Advised pt that she would need to get that information from primary Rn.  Pt resumed phone call Karlene Linemanarlette Carlton RN, BSN

## 2016-04-26 ENCOUNTER — Telehealth: Payer: Self-pay | Admitting: *Deleted

## 2016-04-26 NOTE — Telephone Encounter (Signed)
Pts TOC appt scheduled for 05/02/16 with Theodore Demarkhonda Barrett PA-C at 1000, [facility] office.  Pts Primary Cardiologist will be Dr Tresa EndoKelly.  [facility]

## 2016-04-26 NOTE — Telephone Encounter (Signed)
-----   Message from Laurann Montanaayna N Dunn, [place] sent at 04/23/2016 12:37 PM EST ----- Regarding: Needs TOC visit in 1 week Please schedule this patient for a follow-up appointment and call them with that information.  Primary Cardiologist: Dr. Okey DupreEnd Date of Discharge: 04/23/2016 Appointment Needed Within: 1 week  Appointment Type: TOC - patient left AMA from hospital after STEMI, needs very close follow-up  Thank you! Dayna Dunn PA-C

## 2016-04-26 NOTE — Telephone Encounter (Signed)
Discharged 04/23/16 TOC call attempt # 1 made 04/26/16

## 2016-04-27 NOTE — Telephone Encounter (Signed)
Left message to call back  

## 2016-05-02 ENCOUNTER — Ambulatory Visit (INDEPENDENT_AMBULATORY_CARE_PROVIDER_SITE_OTHER): Payer: Self-pay | Admitting: Physician Assistant

## 2016-05-02 ENCOUNTER — Encounter: Payer: Self-pay | Admitting: Physician Assistant

## 2016-05-02 VITALS — BP 132/70 | HR 68 | Ht 67.5 in | Wt 201.8 lb

## 2016-05-02 DIAGNOSIS — I4901 Ventricular fibrillation: Secondary | ICD-10-CM

## 2016-05-02 DIAGNOSIS — I252 Old myocardial infarction: Secondary | ICD-10-CM

## 2016-05-02 DIAGNOSIS — Z87898 Personal history of other specified conditions: Secondary | ICD-10-CM

## 2016-05-02 DIAGNOSIS — I10 Essential (primary) hypertension: Secondary | ICD-10-CM

## 2016-05-02 DIAGNOSIS — F1991 Other psychoactive substance use, unspecified, in remission: Secondary | ICD-10-CM

## 2016-05-02 MED ORDER — CLOPIDOGREL BISULFATE 75 MG PO TABS: 75.0000 mg | ORAL_TABLET | Freq: Every day | ORAL | 11 refills | Status: DC

## 2016-05-02 NOTE — Progress Notes (Signed)
Cardiology Office Note   Date:  05/02/2016   ID:  Natasha Roach, DOB 28-Apr-1960, MRN [id number]  PCP:  No PCP Per Patient  Cardiologist:  Dr End>>Dr Venora Maples, PA-C   Chief Complaint  Patient presents with  . Post Hospital follow up    large bruise on right side of abdomen. denies cp,sob    History of Present Illness: Natasha Roach is a 56 y.o. 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 These changes may be vasospastic phenomenon from her recent cocaine use. Her isosorbide was increased to 60mg  daily. Pt left AMA, but was given rx  Natasha Roach presents for post-hospital follow up.   She is currently living with her daughter who is 99. She has gotten some of her meds filled, could not afford all of them. She is not sure which ones she has.   She had one episode of ?chest pain, but it was after eating a whole jar of pickles. She admits that her diet in general is poor. She has not been watching the fat/cholesterol.  She is not having any chest pain with activity. 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 She walks some, but it is not organized. She is interested in cardiac rehab.   She is not having any DOE. She has 2 areas of ecchymosis on her abdomen. They are not painful or swollen, they are improving. She remembers getting an injection in at least one of them.  She remembers speaking to someone about getting a PCP, is willing, but cannot remember who spoke to her.    Past Medical History:  Diagnosis Date  . CAD (coronary artery disease) 04/23/2016   a. suspected cocaine induced NSTEMI 12/2015; pt declined cath. b. STEMI 03/2016 due to  cocaine, occ mLAD s/p Synergy stent, EF 40% by cath, 55% by echo.  . Cocaine abuse   . H/O noncompliance with medical treatment, presenting hazards to health   . HTN (hypertension)   . Hyperlipidemia LDL goal <70   . Ischemic cardiomyopathy   . MI (myocardial infarction)    2004 cocaine induced  . Morbid obesity (HCC)   . Prolonged QT interval 04/23/2016  . Substance abuse   . Tobacco abuse   . Ventricular tachycardia, sustained (HCC)    a. at time of STEMI 03/2016.    Past Surgical History:  Procedure Laterality Date  . ABDOMINAL HYSTERECTOMY    . BREAST SURGERY     abcess  . CARDIAC CATHETERIZATION  2004  . CARDIAC CATHETERIZATION N/A 04/21/2016   Procedure: Left Heart Cath and Coronary Angiography;  Surgeon: Lennette Bihari, MD;  Location: [facility] INVASIVE CV LAB;  Service: Cardiovascular;  Laterality: N/A;  . CARDIAC CATHETERIZATION N/A 04/21/2016   Procedure: Coronary Stent Intervention;  Surgeon: Lennette Bihari, MD;  Location: [facility] INVASIVE CV LAB;  Service: Cardiovascular;  Laterality: N/A;  . ELECTROPHYSIOLOGIC STUDY N/A 04/21/2016   Procedure: Cardioversion;  Surgeon: Lennette Bihari, MD;  Location: [facility] INVASIVE CV LAB;  Service: Cardiovascular;  Laterality: N/A;    Current Outpatient Prescriptions  Medication Sig Dispense Refill  . aspirin 81 MG EC tablet Take 1  tablet (81 mg total) by mouth daily. 90 tablet 3  . atorvastatin (LIPITOR) 80 MG tablet Take 1 tablet (80 mg total) by mouth every evening. 30 tablet 6  . clopidogrel (PLAVIX) 75 MG tablet Take 1 tablet (75 mg total) by mouth daily. 30 tablet 11  . isosorbide mononitrate (IMDUR) 60 MG 24 hr tablet Take 1 tablet (60 mg total) by mouth daily. 30 tablet 6  . lisinopril (PRINIVIL,ZESTRIL) 2.5 MG tablet Take 1 tablet (2.5 mg total) by mouth daily. 30 tablet 6  . nitroGLYCERIN (NITROSTAT) 0.4 MG SL tablet Place 1 tablet (0.4 mg total) under the tongue every 5 (five) minutes as needed for chest pain. Up to 3 doses 25 tablet 3    No current facility-administered medications for this visit.     Allergies:   Patient has no known allergies.    Social History:  The patient  reports that she has been smoking Cigarettes.  She has a 40.00 pack-year smoking history. She has never used smokeless tobacco. She reports that she uses drugs, including Cocaine. She reports that she does not drink alcohol.   Family History:  The patient's family history is not on file.    ROS:  Please see the history of present illness. All other systems are reviewed and negative.    PHYSICAL EXAM: VS:  BP 132/70   Pulse 68   Ht 5' 7.5" (1.715 m)   Wt 201 lb 12.8 oz (91.5 kg)   BMI 31.14 kg/m  , BMI Body mass index is 31.14 kg/m. GEN: Well nourished, well developed, female in no acute distress  HEENT: normal for age  Neck: no JVD, no carotid bruit, no masses Cardiac: RRR; soft murmur, no rubs, or gallops Respiratory:  clear to auscultation bilaterally, normal work of breathing GI: soft, nontender, nondistended, + BS MS: no deformity or atrophy; no edema; distal pulses are 2+ in all 4 extremities   Skin: warm and dry, no rash; 2 resolving areas of ecchymosis on her abdomen Neuro:  Strength and sensation are intact Psych: euthymic mood, full affect   EKG:  EKG is ordered today. The ekg ordered today demonstrates SR, no acute changes  CATH: 04/21/2016  There is mild to moderate left ventricular systolic dysfunction.  LV end diastolic pressure is mildly elevated.  Mid LAD-1 lesion, 20 %stenosed.  A STENT SYNERGY DES 3.5X20 drug eluting stent was successfully placed.  Mid LAD-2 lesion, 100 %stenosed.  Post intervention, there is a 0% residual stenosis.   Acute ST segment elevation myocardial infarction occurring shortly after crack cocaine use, secondary to total occlusion of the proximal LAD with initial TIMI 0 flow and no evidence for collateralization. Normal left circumflex and small right coronary  arteries. Reperfusion induced ventricular tachycardia requiring cardioversion/defibrillation and amiodarone 300 mg bolus. Successful PCI to the totally occluded LAD with ultimate insertion of a Synergy 3.520 mm DES stent postdilated to 3.78 mm with the 100% occlusion being reduced to 0% and resumption of TIMI-3 flow in a very large LAD vessel which wrapped around the apex and supplied up to the midportion of the inferior wall. RECOMMENDATION: The patient will continue on dual antiplatelet therapy ideally for a minimum of 1 year following this ST segment elevation MI.  A Synergy DES stent was used in the event she has reduced medical compliance in her large LAD vessel.  A lengthy discussion was done concerning the importance of complete discontinuance of cocaine use and the importance of continuation of medical therapy.  2D echo 04/23/16 - Left ventricle: The cavity size was normal. Wall thickness was increased in a pattern of mild LVH. The estimated ejection fraction was 55%. Although no diagnostic regional wall motion abnormality was identified, this possibility cannot be completely excluded on the basis of this study. Doppler parameters are consistent with abnormal left ventricular relaxation (grade 1 diastolic dysfunction). - Aortic valve: Poorly visualized. Probably trileaflet. There was no stenosis. - Mitral valve: There was no significant regurgitation. - Right ventricle: The cavity size was normal. Systolic function was normal. - Pulmonary arteries: PA peak pressure: 24 mm Hg (S). - Systemic veins: IVC measured 1.9 cm with < 50% respirophasic variation, suggesting RA pressure 8 mmHg. Impressions: - Technically difficult study with poor acoustic windows. Normal LV size with mild LV hypertrophy. EF 55%. Normal RV size and systolic function. No significant valvular abnormalities.  Recent Labs: 04/21/2016: TSH 0.898 04/22/2016: ALT 18; Hemoglobin 13.6;  Platelets 214 04/23/2016: BUN 7; Creatinine, Ser 0.90; Magnesium 2.2; Potassium 4.1; Sodium 139    Lipid Panel    Component Value Date/Time   CHOL 165 04/22/2016 0556   TRIG 176 (H) 04/22/2016 0556   HDL 33 (L) 04/22/2016 0556   CHOLHDL 5.0 04/22/2016 0556   VLDL 35 04/22/2016 0556   LDLCALC 97 04/22/2016 0556     Wt Readings from Last 3 Encounters:  05/02/16 201 lb 12.8 oz (91.5 kg)  04/21/16 220 lb (99.8 kg)  01/02/16 204 lb 11.2 oz (92.9 kg)     Other studies Reviewed: Additional studies/ records that were reviewed today include: hospital records and testing.  ASSESSMENT AND PLAN:  1.  STEMI with VF arrest during reperfusion, subsequent episode of care: Pt generally doing well post-hospital. However, her pharmacy was contacted and she had not had the Plavix filled (they told her $95 at [facility]). She was able to get the ASA, Imdur, and lisinopril. She was given a package of Plavix and another rx will be filled somewhere else. She is encouraged to get her other medications filled as she is able.   We will make sure she is referred to cardiac rehab.   2. Other medical issues: She needs a PCP, [facility] and a voice mail was left.  3. HTN: BP ok today.  4. Tobacco and cocaine hx: pt feels she is stable and can stay off drugs now. I advised that I was not sure she would survive another cocaine use. She is encouraged to quit smoking as well.  5. VF arrest: in the setting of reperfusion: not on BB due to cocaine hx. Consider starting Coreg in the future if she stays off drugs.  Current medicines are reviewed at length with the patient today.  The patient has concerns regarding medicines. Concerns were addressed  The following changes have been made:  Compliance reinforce  Labs/ tests ordered today include:   Orders Placed This Encounter  Procedures  . [facility]  . EKG 12-Lead     Disposition:   FU with Dr End in 1  month.  Natasha AsalSigned, Gracynn Rajewski, PA-C  05/02/2016 3:44 PM    [facility] Phone: [phone]; Fax: [phone]  This note was written with the assistance of speech recognition software. Please excuse any transcriptional errors.

## 2016-05-02 NOTE — Patient Instructions (Addendum)
Medication Instructions:  Your physician recommends that you continue on your current medications as directed. Please refer to the Current Medication list given to you today.  Labwork: none  Testing/Procedures: none  Follow-Up: Your physician recommends that you schedule a follow-up appointment in: 1 month   Any Other Special Instructions Will Be Listed Below (If Applicable). A [facility]   If you need a refill on your cardiac medications before your next appointment, please call your pharmacy.

## 2016-05-03 ENCOUNTER — Telehealth: Payer: Self-pay | Admitting: Cardiovascular Disease

## 2016-05-03 ENCOUNTER — Telehealth: Payer: Self-pay

## 2016-05-03 NOTE — Telephone Encounter (Signed)
Pt notified She will take another today and then2 tomorrow

## 2016-05-03 NOTE — Telephone Encounter (Signed)
LEFT DETAILED MESSAGE(DPR-Ok)  PT TO TAKE 2 PILLS OF PLAVIX (150MG  TOTAL) TODAY (05-03-16) AND TOMORROW Wednesday (05-04-16)

## 2016-05-03 NOTE — Telephone Encounter (Signed)
°  New Prob   Pt calling to verify she is still able to donate plasma with her diagnosis. Please call.

## 2016-05-03 NOTE — Telephone Encounter (Signed)
Returned call to patient-patient states she has donated plasma for 3-4 years and wanted to make sure she can continue to do so.    Advised there is concern with her current medications but I would route to PA to verify.   Pt verbalized understanding.

## 2016-05-05 NOTE — Telephone Encounter (Signed)
Please let her know I have not been able to get in touch with the blood bank. Because of her meds, she may not be able to donate. Still trying to find out.

## 2016-05-06 NOTE — Telephone Encounter (Signed)
Returned call-patient made aware PA is trying to find out about donating plasma.    Pt also requesting to schedule 1 month f/u.   [facility]14 [facility] 9am [facility]    Pt agreed and verbalized understanding.

## 2016-05-24 ENCOUNTER — Telehealth: Payer: Self-pay | Admitting: Physician Assistant

## 2016-05-24 NOTE — Telephone Encounter (Signed)
Spoke w patient. Notes she has refills on file, but "can't afford medications till next week".  She's only out of lisinopril and isosorbide. Asking what she can do if she cannot afford these. I advised to see if she would be able to get 1 week supply from pharmacy either lent or purchased. Explained these are generic meds and should be on low price list.  She voices she will try to purchase these today.  Ensured she is not out of any other meds. She understands the importance of taking her plavix, ASA, daily. Informed pt I would route to provider for any further advice.

## 2016-05-24 NOTE — Telephone Encounter (Signed)
New Message  Pt c/o medication issue:  1. Name of Medication: lisinopril..... isosorbide  2. How are you currently taking this medication (dosage and times per day)? 2.5mg ..... 60mg   3. Are you having a reaction (difficulty breathing--STAT)? no  4. What is your medication issue? Per pt is out of medication and ask for a call back. Pt states she will not be able to get medication until next week. Pt is out of medication. Please call back to discuss

## 2016-05-24 NOTE — Telephone Encounter (Signed)
Sorry, I don't have access to either of these.

## 2016-05-27 ENCOUNTER — Ambulatory Visit: Payer: Self-pay | Admitting: Physician Assistant

## 2016-05-27 NOTE — Progress Notes (Deleted)
Cardiology Office Note   Date:  05/27/2016   ID:  Natasha Roach, DOB 1960-04-30, MRN [id number]  PCP:  No PCP Per Patient  Cardiologist:  Dr. Tresa EndoKelly, saw in hospital 03/2016  Natasha Roach, Natasha LoserRhonda, Natasha Roach 05/02/2016  No chief complaint on file.   History of Present Illness: Natasha Roach is a 56 y.o. 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 These changes may be vasospastic phenomenon from her recent cocaine use. Her isosorbide was increased to 60mg  daily. Pt left AMA, but was given rx Office visit 05/02/2016, patient given a package of Plavix and a prescription because she had never gotten that filled, patient referred to cardiac rehabilitation Phone notes 05/24/2016 regarding patient being out of lisinopril and Imdur, states cannot afford them  Natasha Roach presents for ***   Past Medical History:  Diagnosis Date  . CAD (coronary artery disease) 04/23/2016   a. suspected cocaine induced NSTEMI 12/2015; pt declined cath. b. STEMI 03/2016 due to cocaine, occ mLAD s/p Synergy stent, EF 40% by cath, 55% by echo.  . Cocaine abuse   . H/O noncompliance with medical treatment, presenting hazards to health   . HTN (hypertension)   . Hyperlipidemia LDL goal <70   . Ischemic cardiomyopathy   . MI (myocardial infarction)    2004 cocaine induced  . Morbid obesity (HCC)   . Prolonged QT interval 04/23/2016  . Substance abuse   . Tobacco abuse   . Ventricular tachycardia, sustained (HCC)    a. at time of STEMI 03/2016.    Past Surgical History:  Procedure Laterality Date  . ABDOMINAL HYSTERECTOMY    . BREAST SURGERY     abcess  . CARDIAC CATHETERIZATION  2004  . CARDIAC CATHETERIZATION N/A 04/21/2016    Procedure: Left Heart Cath and Coronary Angiography;  Surgeon: Lennette Biharihomas A Kelly, MD;  [facility];  Service: Cardiovascular;  Laterality: N/A;  . CARDIAC CATHETERIZATION N/A 04/21/2016   Procedure: Coronary Stent Intervention;  Surgeon: Lennette Biharihomas A Kelly, MD;  Location: [facility] INVASIVE CV LAB;  Service: Cardiovascular;  Laterality: N/A;  . ELECTROPHYSIOLOGIC STUDY N/A 04/21/2016   Procedure: Cardioversion;  Surgeon: Lennette Biharihomas A Kelly, MD;  Location: [facility] INVASIVE CV LAB;  Service: Cardiovascular;  Laterality: N/A;    Current Outpatient Prescriptions  Medication Sig Dispense Refill  . aspirin 81 MG EC tablet Take 1 tablet (81 mg total) by mouth daily. 90 tablet 3  . atorvastatin (LIPITOR) 80 MG tablet Take 1 tablet (80 mg total) by mouth every evening. 30 tablet 6  . clopidogrel (PLAVIX) 75 MG tablet Take 1 tablet (75 mg total) by mouth daily. 30 tablet 11  . isosorbide mononitrate (IMDUR) 60 MG 24 hr tablet Take 1 tablet (60 mg total) by mouth daily. 30 tablet 6  . lisinopril (PRINIVIL,ZESTRIL) 2.5 MG tablet Take 1 tablet (2.5 mg total) by mouth daily. 30 tablet 6  . nitroGLYCERIN (NITROSTAT) 0.4 MG SL tablet Place 1 tablet (0.4 mg total) under the tongue every 5 (five) minutes as needed for chest pain. Up to 3 doses 25 tablet 3   No current facility-administered medications for this visit.     Allergies:   Patient has no known allergies.  Social History:  The patient  reports that she has been smoking Cigarettes.  She has a 40.00 pack-year smoking history. She has never used smokeless tobacco. She reports that she uses drugs, including Cocaine. She reports that she does not drink alcohol.   Family History:  The patient's family history is not on file.    ROS:  Please see the history of present illness. All other systems are reviewed and negative.    PHYSICAL EXAM: VS:  There were no vitals taken for this visit. , BMI There is no height or weight on file to calculate BMI. GEN:  Well nourished, well developed, female in no acute distress  HEENT: normal for age  Neck: no JVD, no carotid bruit, no masses Cardiac: RRR; no murmur, no rubs, or gallops Respiratory:  clear to auscultation bilaterally, normal work of breathing GI: soft, nontender, nondistended, + BS MS: no deformity or atrophy; no edema; distal pulses are 2+ in all 4 extremities   Skin: warm and dry, no rash Neuro:  Strength and sensation are intact Psych: euthymic mood, full affect   EKG:  EKG {ACTION; IS/IS ZOX:[id number]} ordered today. The ekg ordered today demonstrates ***   Recent Labs: 04/21/2016: TSH 0.898 04/22/2016: ALT 18; Hemoglobin 13.6; Platelets 214 04/23/2016: BUN 7; Creatinine, Ser 0.90; Magnesium 2.2; Potassium 4.1; Sodium 139    Lipid Panel    Component Value Date/Time   CHOL 165 04/22/2016 0556   TRIG 176 (H) 04/22/2016 0556   HDL 33 (L) 04/22/2016 0556   CHOLHDL 5.0 04/22/2016 0556   VLDL 35 04/22/2016 0556   LDLCALC 97 04/22/2016 0556     Wt Readings from Last 3 Encounters:  05/02/16 201 lb 12.8 oz (91.5 kg)  04/21/16 220 lb (99.8 kg)  01/02/16 204 lb 11.2 oz (92.9 kg)     Other studies Reviewed: Additional studies/ records that were reviewed today include: ***.  ASSESSMENT AND PLAN:  1.  ***   Current medicines are reviewed at length with the patient today.  The patient {ACTIONS; HAS/DOES NOT HAVE:19233} concerns regarding medicines.  The following changes have been made:  {PLAN; NO CHANGE:13088:s}  Labs/ tests ordered today include: *** No orders of the defined types were placed in this encounter.    Disposition:   FU with ***  Signed, Theodore Demark, PA-C  05/27/2016 9:40 AM    [facility] Phone: [phone]; Fax: [phone]  This note was written with the assistance of speech recognition software. Please excuse any transcriptional errors.

## 2016-05-31 ENCOUNTER — Encounter: Payer: Self-pay | Admitting: Physician Assistant

## 2016-05-31 ENCOUNTER — Ambulatory Visit (INDEPENDENT_AMBULATORY_CARE_PROVIDER_SITE_OTHER): Payer: Self-pay | Admitting: Physician Assistant

## 2016-05-31 VITALS — BP 147/91 | HR 72 | Ht 67.5 in | Wt 203.6 lb

## 2016-05-31 DIAGNOSIS — Z87898 Personal history of other specified conditions: Secondary | ICD-10-CM

## 2016-05-31 DIAGNOSIS — F1991 Other psychoactive substance use, unspecified, in remission: Secondary | ICD-10-CM

## 2016-05-31 DIAGNOSIS — I252 Old myocardial infarction: Secondary | ICD-10-CM

## 2016-05-31 DIAGNOSIS — I10 Essential (primary) hypertension: Secondary | ICD-10-CM

## 2016-05-31 DIAGNOSIS — Z79899 Other long term (current) drug therapy: Secondary | ICD-10-CM

## 2016-05-31 DIAGNOSIS — E785 Hyperlipidemia, unspecified: Secondary | ICD-10-CM

## 2016-05-31 NOTE — Progress Notes (Signed)
Cardiology Office Note   Date:  05/31/2016   ID:  Natasha Roach, MRN [id number]  PCP:  No PCP Per Patient  Cardiologist:  Dr. Venora MaplesKelly  Natasha Isa, PA-C 05/02/2016  Chief Complaint  Patient presents with  . Follow-up    Pt states no Sx.    History of Present Illness: Natasha Roach is a [age] y.o. 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 fib arrest Office visit 01/08, patient found to not to be on Plavix and was loaded with Plavix. Patient with financial issues regarding obtaining her medications.  Natasha Roach presents for cardiology follow up.  She is doing well from a heart standpoint. She doesn't get chest pain, but occasionally gets some chest discomfort. It is not exertional. It resolves without intervention. It scares her a little but is not like her MI. She is not having any breathing issues. No orthopnea, PND, LE edema or DOE.  She has had a cough over a year. It is a dry cough. She has been out of her lisinopril with no change in the cough.  She has run out of Plavix, says will be able to get it on 02/08. She has been out for 3 days. She has been out of the lisinopril and the Lipitor as well. She has a few Imdur tabs left, she has ASA and SL NTG.Her family and friends are in a help her obtain her medications on February 8.  She has not donated plasma since her MI. She has noted easier bruising, but the bruises resolve normally. It was explained to her that she cannot donate because of the Plavix.  She is increasing her activity level and feels she is doing well with that. She is generally feeling better, but struggling with trying to create a healthy lifestyle.    Past Medical History:  Diagnosis Date  . CAD (coronary artery disease) 04/23/2016   a. suspected cocaine induced NSTEMI 12/2015; pt  declined cath. b. STEMI 03/2016 due to cocaine, occ mLAD s/p Synergy stent, EF 40% by cath, 55% by echo.  . Cocaine abuse   . H/O noncompliance with medical treatment, presenting hazards to health   . HTN (hypertension)   . Hyperlipidemia LDL goal <70   . Ischemic cardiomyopathy   . MI (myocardial infarction)    2004 cocaine induced  . Morbid obesity (HCC)   . Prolonged QT interval 04/23/2016  . Substance abuse   . Tobacco abuse   . Ventricular tachycardia, sustained (HCC)    a. at time of STEMI 03/2016.    Past Surgical History:  Procedure Laterality Date  . ABDOMINAL HYSTERECTOMY    . BREAST SURGERY     abcess  . CARDIAC CATHETERIZATION  2004  . CARDIAC CATHETERIZATION N/A 04/21/2016   Procedure: Left Heart Cath and Coronary Angiography;  Surgeon: Lennette Biharihomas A Kelly, MD;  [facility];  Service: Cardiovascular;  Laterality: N/A;  . CARDIAC CATHETERIZATION N/A 04/21/2016   Procedure: Coronary Stent Intervention;  Surgeon: Lennette Biharihomas A Kelly, MD;  Location: [facility] INVASIVE CV LAB;  Service: Cardiovascular;  Laterality: N/A;  . ELECTROPHYSIOLOGIC STUDY N/A 04/21/2016   Procedure: Cardioversion;  Surgeon: Lennette Biharihomas A Kelly, MD;  Location: [facility] INVASIVE CV LAB;  Service: Cardiovascular;  Laterality: N/A;    Current Outpatient Prescriptions  Medication Sig Dispense Refill  .  aspirin 81 MG EC tablet Take 1 tablet (81 mg total) by mouth daily. 90 tablet 3  . atorvastatin (LIPITOR) 80 MG tablet Take 1 tablet (80 mg total) by mouth every evening. 30 tablet 6  . clopidogrel (PLAVIX) 75 MG tablet Take 1 tablet (75 mg total) by mouth daily. 30 tablet 11  . isosorbide mononitrate (IMDUR) 60 MG 24 hr tablet Take 1 tablet (60 mg total) by mouth daily. 30 tablet 6  . lisinopril (PRINIVIL,ZESTRIL) 2.5 MG tablet Take 1 tablet (2.5 mg total) by mouth daily. 30 tablet 6  . nitroGLYCERIN (NITROSTAT) 0.4 MG SL tablet Place 1 tablet (0.4 mg total) under the tongue every 5 (five) minutes as needed for  chest pain. Up to 3 doses 25 tablet 3   No current facility-administered medications for this visit.     Allergies:   Patient has no known allergies.    Social History:  The patient  reports that she has been smoking Cigarettes.  She has a 40.00 pack-year smoking history. She has never used smokeless tobacco. She reports that she uses drugs, including Cocaine. She reports that she does not drink alcohol.   Family History:  The patient's family history is not on file.    ROS:  Please see the history of present illness. All other systems are reviewed and negative.    PHYSICAL EXAM: VS:  BP (!) 147/91   Pulse 72   Ht 5' 7.5" (1.715 m)   Wt 203 lb 9.6 oz (92.4 kg)   BMI 31.42 kg/m  , BMI Body mass index is 31.42 kg/m. GEN: Well nourished, well developed, female in no acute distress  HEENT: normal for age  Neck: no JVD, no carotid bruit, no masses Cardiac: RRR; soft murmur, no rubs, or gallops Respiratory:  clear to auscultation bilaterally, normal work of breathing GI: soft, nontender, nondistended, + BS MS: no deformity or atrophy; no edema; distal pulses are 2+ in all 4 extremities   Skin: warm and dry, no rash Neuro:  Strength and sensation are intact Psych: euthymic mood, full affect   EKG:  EKG is not ordered today.  Recent Labs: 04/21/2016: TSH 0.898 04/22/2016: ALT 18; Hemoglobin 13.6; Platelets 214 04/23/2016: BUN 7; Creatinine, Ser 0.90; Magnesium 2.2; Potassium 4.1; Sodium 139    Lipid Panel    Component Value Date/Time   CHOL 165 04/22/2016 0556   TRIG 176 (H) 04/22/2016 0556   HDL 33 (L) 04/22/2016 0556   CHOLHDL 5.0 04/22/2016 0556   VLDL 35 04/22/2016 0556   LDLCALC 97 04/22/2016 0556     Wt Readings from Last 3 Encounters:  05/31/16 203 lb 9.6 oz (92.4 kg)  05/02/16 201 lb 12.8 oz (91.5 kg)  04/21/16 220 lb (99.8 kg)    k Other studies Reviewed: Additional studies/ records that were reviewed today include: Office notes, hospital records and  testing.  ASSESSMENT AND PLAN:  1.  STEMI, subsequent episode of care: It was explained to [name]. It is extremely important that she take her Plavix. I consulted with the pharmacist and we were able to get her short-term prescription for Brilinta. She is to take this until she gets her Plavix filled in a few days. She is then to stay on the Plavix. Compliance with this was encouraged to the patient feels she will be able to do this once everything gets a little more stabilized.  2. Hyperlipidemia: Continue high-dose statin. If she can not afford to fill the Lipitor on a  regular basis, we will switch her to a cheaper medication. Recheck labs when she comes for a follow-up visit.  3. Hypertension: The importance of continuing her blood pressure medications was emphasized. She is trying to do better.  4. Polysubstance abuse: She states she has not used cocaine since discharge. She is really trying to stay off drugs. She is struggling with this a bit by appearance, I hope she is successful. The importance of being off drugs to give her a chance and ongoing good health was emphasized.  5. Dual antiplatelet therapy: The pharmacist wasn't speaking to her. The plan that was worked out was for her to take Brilinta temporarily until she can get the Plavix filled.  6. Primary care issues: We contacted [facility] to try and get her primary care appointment. However, we were unable to get it at this time. She was given the contact information and is to follow-up with them as they will have another opportunity to get on the new patient list next week.  Current medicines are reviewed at length with the patient today.  The patient has concerns regarding medicines.  The following changes have been made:  Temporarily take Brilinta instead of the Plavix until she can get the Plavix filled.  Labs/ tests ordered today include:   Orders Placed This Encounter  Procedures  . Lipid panel  . Comprehensive metabolic  panel     Disposition:   FU with Dr. Tresa Endo  Signed, Jasleen Riepe, [name], PA-C  05/31/2016 5:46 PM    [facility] Phone: [phone]; Fax: [phone]  This note was written with the assistance of speech recognition software. Please excuse any transcriptional errors.

## 2016-05-31 NOTE — Patient Instructions (Addendum)
Medication Instructions:  RE-START MEDICATIONS ASAP   If you need a refill on your cardiac medications before your next appointment, please call your pharmacy.  Labwork: LIPID AND CMP 1 WEEK BEFORE NEXT APPOINTMENT  Follow-Up: Your physician recommends that you schedule a follow-up appointment in: 3 MONTHS WITH DR Tresa EndoKELLY   Special Instructions: PLEASE QUIT SMOKING, STOP COCAINE USE AND STAY HEALTHY  CALL [facility] ON 2-12-22018 TO SCHEDULE A NEW PATIENT APPOINTMENT ADDRESS IS [address].    Thank you for choosing [facility]!!    [name], LPN [name] BARRETT, PA-C

## 2016-06-08 ENCOUNTER — Ambulatory Visit: Payer: Self-pay | Admitting: Physician Assistant

## 2016-07-07 ENCOUNTER — Other Ambulatory Visit: Payer: Self-pay | Admitting: Physician Assistant

## 2016-09-08 ENCOUNTER — Ambulatory Visit: Payer: Self-pay | Admitting: Physician Assistant

## 2016-09-14 NOTE — Progress Notes (Signed)
Cardiology Office Note    Date:  09/16/2016   ID:  Natasha Roach, DOB 01-01-1961, MRN [id number]  PCP:  Patient, No Pcp Per  Cardiologist: Dr. Tresa Roach   Chief Complaint  Patient presents with  . Follow-up    3 months    History of Present Illness:    Natasha Roach is a 56 y.o. female with past medical history of CAD (history of cocaine induced NSTEMI in 12/2015, STEMI in 03/2016 with DES to LAD), HTN, HLD, and cocaine use who presents to the office for 52-month follow-up.   She was last examined by Theodore Demark, PA-C on 05/31/2016 and reported doing well at that time. Had been out of her Plavix, Lisinopril, and Lipitor for the past 3 days. Reported not using cocaine since her hospital discharge in 03/2016. She was provided with Brilinta samples until able to refill her Plavix.   In talking with the patient today, she reports doing well from a cardiac perspective since her last office visit. She denies any exertional chest discomfort or dyspnea on exertion. She does note occasional episodes of shooting pain along her left quadricep which occurs when she coughs and resolves spontaneously. No association of her leg pain with activity.  She did run out of her medications approximately 2 weeks ago and has not taken anything but aspirin. She reports she is trying to get established with a clinic in [facility] and will be able to receive her medications from there. When asked about cessation of cocaine, she reports " I am doing the best I can". She is still smoking one pack per day.   Past Medical History:  Diagnosis Date  . CAD (coronary artery disease) 04/23/2016   a. suspected cocaine induced NSTEMI 12/2015; pt declined cath. b. STEMI 03/2016 due to cocaine, occ mLAD s/p Synergy stent, EF 40% by cath, 55% by echo.  . Cocaine abuse   . H/O noncompliance with medical treatment, presenting hazards to health   . HTN (hypertension)   . Hyperlipidemia LDL goal <70   . Ischemic  cardiomyopathy   . 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 Morbid obesity (HCC)   . Prolonged QT interval 04/23/2016  . Substance abuse   . Tobacco abuse   . Ventricular tachycardia, sustained (HCC)    a. at time of STEMI 03/2016.    Past Surgical History:  Procedure Laterality Date  . ABDOMINAL HYSTERECTOMY    . BREAST SURGERY     abcess  . CARDIAC CATHETERIZATION  2004  . CARDIAC CATHETERIZATION N/A 04/21/2016   Procedure: Left Heart Cath and Coronary Angiography;  Surgeon: Lennette Bihari, MD;  Location: [facility] INVASIVE CV LAB;  Service: Cardiovascular;  Laterality: N/A;  . CARDIAC CATHETERIZATION N/A 04/21/2016   Procedure: Coronary Stent Intervention;  Surgeon: Lennette Bihari, MD;  Location: [facility] INVASIVE CV LAB;  Service: Cardiovascular;  Laterality: N/A;  . ELECTROPHYSIOLOGIC STUDY N/A 04/21/2016   Procedure: Cardioversion;  Surgeon: Lennette Bihari, MD;  Location: [facility] INVASIVE CV LAB;  Service: Cardiovascular;  Laterality: N/A;    Current Medications: Outpatient Medications Prior to Visit  Medication Sig Dispense Refill  . aspirin 81 MG EC tablet Take 1 tablet (81 mg total) by mouth daily. 90 tablet 3  . clopidogrel (PLAVIX) 75 MG tablet Take 1 tablet (75 mg total) by mouth daily. 30 tablet 11  . isosorbide mononitrate (IMDUR) 60 MG 24 hr tablet TAKE 1 TABLET (60 MG TOTAL) BY  MOUTH DAILY. 30 tablet 10  . lisinopril (PRINIVIL,ZESTRIL) 2.5 MG tablet Take 1 tablet (2.5 mg total) by mouth daily. 30 tablet 9  . nitroGLYCERIN (NITROSTAT) 0.4 MG SL tablet Place 1 tablet (0.4 mg total) under the tongue every 5 (five) minutes as needed for chest pain. Up to 3 doses 25 tablet 3  . atorvastatin (LIPITOR) 80 MG tablet Take 1 tablet (80 mg total) by mouth every evening. 30 tablet 6   No facility-administered medications prior to visit.      Allergies:   Patient has no known allergies.   Social History   Social History  . Marital status: Significant Other    Spouse  name: N/A  . Number of children: N/A  . Years of education: N/A   Social History Main Topics  . Smoking status: Current Every Day Smoker    Packs/day: 1.00    Years: 40.00    Types: Cigarettes  . Smokeless tobacco: Never Used  . Alcohol use No  . Drug use: Yes    Types: Cocaine     Comment: last use 04/21/16  . Sexual activity: Yes   Other Topics Concern  . None   Social History Narrative  . None     Family History:  The patient's family history includes CAD in her father.   Review of Systems:   Please see the history of present illness.     General:  No chills, fever, night sweats or weight changes.  Cardiovascular:  No chest pain, dyspnea on exertion, edema, orthopnea, palpitations, paroxysmal nocturnal dyspnea. Dermatological: No rash, lesions/masses Respiratory: No cough, dyspnea Urologic: No hematuria, dysuria MSK: Positive for left leg pain.  Abdominal:   No nausea, vomiting, diarrhea, bright red blood per rectum, melena, or hematemesis Neurologic:  No visual changes, wkns, changes in mental status. All other systems reviewed and are otherwise negative except as noted above.   Physical Exam:    VS:  BP 108/74   Pulse 76   Ht 5\' 7"  (1.702 m)   Wt 185 lb 3.2 oz (84 kg)   BMI 29.01 kg/m    General: Well developed, well nourished [place] female appearing in no acute distress. Head: Normocephalic, atraumatic, sclera non-icteric, no xanthomas, nares are without discharge.  Neck: No carotid bruits. JVD not elevated.  Lungs: Respirations regular and unlabored, without wheezes or rales.  Heart: Regular rate and rhythm. No S3 or S4.  No murmur, no rubs, or gallops appreciated. Abdomen: Soft, non-tender, non-distended with normoactive bowel sounds. No hepatomegaly. No rebound/guarding. No obvious abdominal masses. Msk:  Strength and tone appear normal for age. No joint deformities or effusions. Extremities: No clubbing or cyanosis. No lower extremity edema.   Distal pedal pulses are 2+ bilaterally. Neuro: Alert and oriented X 3. Moves all extremities spontaneously. No focal deficits noted. Psych:  Responds to questions appropriately with a normal affect. Skin: No rashes or lesions noted  Wt Readings from Last 3 Encounters:  09/16/16 185 lb 3.2 oz (84 kg)  05/31/16 203 lb 9.6 oz (92.4 kg)  05/02/16 201 lb 12.8 oz (91.5 kg)    Studies/Labs Reviewed:   EKG:  EKG is not ordered today.  Recent Labs: 04/21/2016: TSH 0.898 04/22/2016: ALT 18; Hemoglobin 13.6; Platelets 214 04/23/2016: BUN 7; Creatinine, Ser 0.90; Magnesium 2.2; Potassium 4.1; Sodium 139   Lipid Panel    Component Value Date/Time   CHOL 165 04/22/2016 0556   TRIG 176 (H) 04/22/2016 0556   HDL 33 (L) 04/22/2016  0556   CHOLHDL 5.0 04/22/2016 0556   VLDL 35 04/22/2016 0556   LDLCALC 97 04/22/2016 0556    Additional studies/ records that were reviewed today include:   Cardiac Catheterization: 04/21/2016  There is mild to moderate left ventricular systolic dysfunction.  LV end diastolic pressure is mildly elevated.  Mid LAD-1 lesion, 20 %stenosed.  A STENT SYNERGY DES 3.5X20 drug eluting stent was successfully placed.  Mid LAD-2 lesion, 100 %stenosed.  Post intervention, there is a 0% residual stenosis.   Acute ST segment elevation myocardial infarction occurring shortly after crack cocaine use, secondary to total occlusion of the proximal LAD with initial TIMI 0 flow and no evidence for collateralization.  Normal left circumflex and small right coronary arteries.  Reperfusion induced ventricular tachycardia requiring cardioversion/defibrillation and amiodarone 300 mg bolus.  Successful PCI to the totally occluded LAD with ultimate insertion of a Synergy 3.520 mm DES stent postdilated to 3.78 mm with the 100% occlusion being reduced to 0% and resumption of TIMI-3 flow in a very large LAD vessel which wrapped around the apex and supplied up to the midportion of  the inferior wall.  RECOMMENDATION: The patient will continue on dual antiplatelet therapy ideally for a minimum of 1 year following this ST segment elevation MI.  A Synergy DES stent was used in the event she has reduced medical compliance in her large LAD vessel.  A lengthy discussion was done concerning the importance of complete discontinuance of cocaine use and the importance of continuation of medical therapy.    Assessment:    1. Coronary artery disease involving native coronary artery of native heart without angina pectoris   2. Hyperlipidemia LDL goal <70   3. Cocaine use   4. Tobacco use      Plan:   In order of problems listed above:  1. CAD - history of cocaine induced NSTEMI in 12/2015, STEMI in 03/2016 with DES to LAD placed at that time.  - she denies any recent chest pain or dyspnea on exertion. Unfortunately, she quit taking her Plavix 2 weeks ago due to being unable to afford refills. She is trying to get established with a clinic in [facility] for medication assistance. We reviewed how limiting her cigarette use could assist with this financial burden. - will provide with Brilinta samples until she is able to have her Plavix filled next week. Continue with ASA, Imdur and statin therapy. No BB secondary to cocaine use.   2. HLD - most recent Lipid Panel showed LDL of 97. Goal is < 70 with known CAD. - she has went for several weeks at a time without taking her statin. The importance of her medications was reviewed with the patient. Continue Atorvastatin 80mg  daily.   3. Cocaine Use - she previously reported not using Cocaine since her MI in 02/2016. She does not confirm nor deny recent use at today's visit. Cessation advised. Continue to avoid BB therapy.   4. Tobacco Use - she is still smoking 1 ppd. Reports smoking up to 2 ppd at certain points over the past 30 years.  - cessation advised.    Medication Adjustments/Labs and Tests Ordered: Current medicines  are reviewed at length with the patient today.  Concerns regarding medicines are outlined above.  Medication changes, Labs and Tests ordered today are listed in the Patient Instructions below. Patient Instructions  Medication Instructions:  Take Brilinta 90 mg (1 tablet) two times daily until you get the Plavix filled.  Labwork: NONE  Testing/Procedures: NONE  Follow-Up: Your physician wants you to follow-up in: 6 months with Dr. Tresa Roach.  You will receive a reminder letter in the mail two months in advance. If you don't receive a letter, please call our office to schedule the follow-up appointment.  Any Other Special Instructions Will Be Listed Below (If Applicable).  If you need a refill on your cardiac medications before your next appointment, please call your pharmacy.  Signed, Ellsworth Lennox, PA-C  09/16/2016 1:17 PM    [facility] [address] [place], [address] [place], [place]  [address] Phone: [phone]; Fax: [phone]  [address] [place], [place] [address] Phone: [phone]

## 2016-09-16 ENCOUNTER — Encounter: Payer: Self-pay | Admitting: Student

## 2016-09-16 ENCOUNTER — Ambulatory Visit (INDEPENDENT_AMBULATORY_CARE_PROVIDER_SITE_OTHER): Payer: Self-pay | Admitting: Student

## 2016-09-16 VITALS — BP 108/74 | HR 76 | Ht 67.0 in | Wt 185.2 lb

## 2016-09-16 DIAGNOSIS — Z720 Tobacco use: Secondary | ICD-10-CM

## 2016-09-16 DIAGNOSIS — F1490 Cocaine use, unspecified, uncomplicated: Secondary | ICD-10-CM

## 2016-09-16 DIAGNOSIS — E785 Hyperlipidemia, unspecified: Secondary | ICD-10-CM

## 2016-09-16 DIAGNOSIS — I2510 Atherosclerotic heart disease of native coronary artery without angina pectoris: Secondary | ICD-10-CM

## 2016-09-16 MED ORDER — ATORVASTATIN CALCIUM 80 MG PO TABS: 80.0000 mg | ORAL_TABLET | Freq: Every evening | ORAL | 11 refills | Status: DC

## 2016-09-16 NOTE — Patient Instructions (Addendum)
Medication Instructions:  Take Brilinta 90 mg (1 tablet) two times daily until you get the Plavix filled.       Labwork: NONE  Testing/Procedures: NONE  Follow-Up: Your physician wants you to follow-up in: 6 months with Dr. Tresa EndoKelly.  You will receive a reminder letter in the mail two months in advance. If you don't receive a letter, please call our office to schedule the follow-up appointment.   Any Other Special Instructions Will Be Listed Below (If Applicable).     If you need a refill on your cardiac medications before your next appointment, please call your pharmacy.

## 2016-09-16 NOTE — Progress Notes (Signed)
Medication samples have been provided to the patient.  Drug name: Brilinta 90mg   Qty: 32 days  LOT: [id number]  Exp.Date: 09/20

## 2017-10-27 ENCOUNTER — Emergency Department (HOSPITAL_COMMUNITY): Payer: Self-pay

## 2017-10-27 ENCOUNTER — Inpatient Hospital Stay (HOSPITAL_COMMUNITY)
Admission: EM | Admit: 2017-10-27 | Discharge: 2017-10-28 | DRG: 247 | Discharge status: Against Medical Advice | Payer: Self-pay | Attending: Interventional Cardiology | Admitting: Interventional Cardiology

## 2017-10-27 ENCOUNTER — Other Ambulatory Visit: Payer: Self-pay

## 2017-10-27 ENCOUNTER — Inpatient Hospital Stay (HOSPITAL_COMMUNITY)
Admission: EM | Discharge status: Against Medical Advice | Payer: Self-pay | Source: Home / Self Care | Attending: Cardiology

## 2017-10-27 DIAGNOSIS — Z720 Tobacco use: Secondary | ICD-10-CM | POA: Diagnosis present

## 2017-10-27 DIAGNOSIS — Z9119 Patient's noncompliance with other medical treatment and regimen: Secondary | ICD-10-CM

## 2017-10-27 DIAGNOSIS — I2102 ST elevation (STEMI) myocardial infarction involving left anterior descending coronary artery: Principal | ICD-10-CM | POA: Diagnosis present

## 2017-10-27 DIAGNOSIS — Z90710 Acquired absence of both cervix and uterus: Secondary | ICD-10-CM

## 2017-10-27 DIAGNOSIS — I10 Essential (primary) hypertension: Secondary | ICD-10-CM | POA: Diagnosis present

## 2017-10-27 DIAGNOSIS — I213 ST elevation (STEMI) myocardial infarction of unspecified site: Secondary | ICD-10-CM

## 2017-10-27 DIAGNOSIS — I472 Ventricular tachycardia: Secondary | ICD-10-CM | POA: Diagnosis present

## 2017-10-27 DIAGNOSIS — F17210 Nicotine dependence, cigarettes, uncomplicated: Secondary | ICD-10-CM | POA: Diagnosis present

## 2017-10-27 DIAGNOSIS — F1410 Cocaine abuse, uncomplicated: Secondary | ICD-10-CM | POA: Diagnosis present

## 2017-10-27 DIAGNOSIS — I255 Ischemic cardiomyopathy: Secondary | ICD-10-CM | POA: Diagnosis present

## 2017-10-27 DIAGNOSIS — E785 Hyperlipidemia, unspecified: Secondary | ICD-10-CM | POA: Diagnosis present

## 2017-10-27 DIAGNOSIS — Z8674 Personal history of sudden cardiac arrest: Secondary | ICD-10-CM

## 2017-10-27 DIAGNOSIS — Z955 Presence of coronary angioplasty implant and graft: Secondary | ICD-10-CM

## 2017-10-27 DIAGNOSIS — Z91199 Patient's noncompliance with other medical treatment and regimen due to unspecified reason: Secondary | ICD-10-CM

## 2017-10-27 DIAGNOSIS — I252 Old myocardial infarction: Secondary | ICD-10-CM

## 2017-10-27 DIAGNOSIS — Z7982 Long term (current) use of aspirin: Secondary | ICD-10-CM

## 2017-10-27 DIAGNOSIS — E119 Type 2 diabetes mellitus without complications: Secondary | ICD-10-CM | POA: Diagnosis present

## 2017-10-27 DIAGNOSIS — Z79899 Other long term (current) drug therapy: Secondary | ICD-10-CM

## 2017-10-27 DIAGNOSIS — Z7902 Long term (current) use of antithrombotics/antiplatelets: Secondary | ICD-10-CM

## 2017-10-27 DIAGNOSIS — I2510 Atherosclerotic heart disease of native coronary artery without angina pectoris: Secondary | ICD-10-CM | POA: Diagnosis present

## 2017-10-27 DIAGNOSIS — Z8249 Family history of ischemic heart disease and other diseases of the circulatory system: Secondary | ICD-10-CM

## 2017-10-27 HISTORY — PX: CORONARY STENT INTERVENTION: CATH118234

## 2017-10-27 HISTORY — PX: LEFT HEART CATH AND CORONARY ANGIOGRAPHY: CATH118249

## 2017-10-27 HISTORY — PX: CORONARY/GRAFT ACUTE MI REVASCULARIZATION: CATH118305

## 2017-10-27 LAB — CBC
HEMATOCRIT: 48.6 % — AB (ref 36.0–46.0)
Hemoglobin: 15 g/dL (ref 12.0–15.0)
MCH: 27.2 pg (ref 26.0–34.0)
MCHC: 30.9 g/dL (ref 30.0–36.0)
MCV: 88 fL (ref 78.0–100.0)
Platelets: 198 10*3/uL (ref 150–400)
RBC: 5.52 MIL/uL — AB (ref 3.87–5.11)
RDW: 12.6 % (ref 11.5–15.5)
WBC: 17.6 10*3/uL — AB (ref 4.0–10.5)

## 2017-10-27 LAB — POCT ACTIVATED CLOTTING TIME
ACTIVATED CLOTTING TIME: 582 s
ACTIVATED CLOTTING TIME: 136 s

## 2017-10-27 LAB — CBC WITH DIFFERENTIAL/PLATELET
Abs Immature Granulocytes: 0.1 10*3/uL (ref 0.0–0.1)
BASOS ABS: 0.1 10*3/uL (ref 0.0–0.1)
BASOS PCT: 0 %
EOS PCT: 1 %
Eosinophils Absolute: 0.1 10*3/uL (ref 0.0–0.7)
HCT: 48 % — ABNORMAL HIGH (ref 36.0–46.0)
Hemoglobin: 14.9 g/dL (ref 12.0–15.0)
Immature Granulocytes: 1 %
Lymphocytes Relative: 17 %
Lymphs Abs: 2.9 10*3/uL (ref 0.7–4.0)
MCH: 27.2 pg (ref 26.0–34.0)
MCHC: 31 g/dL (ref 30.0–36.0)
MCV: 87.8 fL (ref 78.0–100.0)
MONO ABS: 0.5 10*3/uL (ref 0.1–1.0)
Monocytes Relative: 3 %
Neutro Abs: 13.1 10*3/uL — ABNORMAL HIGH (ref 1.7–7.7)
Neutrophils Relative %: 78 %
PLATELETS: 188 10*3/uL (ref 150–400)
RBC: 5.47 MIL/uL — ABNORMAL HIGH (ref 3.87–5.11)
RDW: 12.5 % (ref 11.5–15.5)
WBC: 16.7 10*3/uL — ABNORMAL HIGH (ref 4.0–10.5)

## 2017-10-27 LAB — COMPREHENSIVE METABOLIC PANEL
ALBUMIN: 3.2 g/dL — AB (ref 3.5–5.0)
ALK PHOS: 86 U/L (ref 38–126)
ALT: 15 U/L (ref 0–44)
ANION GAP: 6 (ref 5–15)
AST: 15 U/L (ref 15–41)
BILIRUBIN TOTAL: 0.6 mg/dL (ref 0.3–1.2)
BUN: 8 mg/dL (ref 6–20)
CALCIUM: 8.5 mg/dL — AB (ref 8.9–10.3)
CO2: 24 mmol/L (ref 22–32)
Chloride: 112 mmol/L — ABNORMAL HIGH (ref 98–111)
Creatinine, Ser: 0.83 mg/dL (ref 0.44–1.00)
GFR calc Af Amer: >60 mL/min (ref >60)
GLUCOSE: 165 mg/dL — AB (ref 70–99)
Potassium: 4 mmol/L (ref 3.5–5.1)
Sodium: 142 mmol/L (ref 135–145)
TOTAL PROTEIN: 4.8 g/dL — AB (ref 6.5–8.1)

## 2017-10-27 LAB — I-STAT TROPONIN, ED: Troponin i, poc: 0 ng/mL (ref 0.00–0.08)

## 2017-10-27 LAB — POCT I-STAT, CHEM 8
BUN: 8 mg/dL (ref 6–20)
CALCIUM ION: 1.14 mmol/L — AB (ref 1.15–1.40)
CHLORIDE: 109 mmol/L (ref 98–111)
CREATININE: 0.6 mg/dL (ref 0.44–1.00)
GLUCOSE: 141 mg/dL — AB (ref 70–99)
HCT: 42 % (ref 36.0–46.0)
Hemoglobin: 14.3 g/dL (ref 12.0–15.0)
Potassium: 3.6 mmol/L (ref 3.5–5.1)
Sodium: 143 mmol/L (ref 135–145)
TCO2: 20 mmol/L — ABNORMAL LOW (ref 22–32)

## 2017-10-27 LAB — TROPONIN I: Troponin I: 0.27 ng/mL (ref <0.03)

## 2017-10-27 LAB — CBG MONITORING, ED: GLUCOSE-CAPILLARY: 153 mg/dL — AB (ref 70–99)

## 2017-10-27 LAB — CREATININE, SERUM
Creatinine, Ser: 0.82 mg/dL (ref 0.44–1.00)
GFR calc Af Amer: >60 mL/min (ref >60)

## 2017-10-27 LAB — LIPASE, BLOOD: LIPASE: 23 U/L (ref 11–51)

## 2017-10-27 SURGERY — LEFT HEART CATH AND CORONARY ANGIOGRAPHY
Anesthesia: LOCAL

## 2017-10-27 MED ORDER — ONDANSETRON HCL 4 MG/2ML IJ SOLN
4.0000 mg | Freq: Four times a day (QID) | INTRAMUSCULAR | Status: DC | PRN
  Administered 2017-10-27: 4 mg via INTRAVENOUS
  Filled 2017-10-27: qty 2

## 2017-10-27 MED ORDER — HYDRALAZINE HCL 20 MG/ML IJ SOLN
10.0000 mg | Freq: Once | INTRAMUSCULAR | Status: AC
  Administered 2017-10-27: 10 mg via INTRAVENOUS
  Filled 2017-10-27: qty 1

## 2017-10-27 MED ORDER — SODIUM CHLORIDE 0.9 % IV SOLN
INTRAVENOUS | Status: AC | PRN
  Administered 2017-10-27: 1.75 mg/kg/h via INTRAVENOUS

## 2017-10-27 MED ORDER — TICAGRELOR 90 MG PO TABS
ORAL_TABLET | ORAL | Status: AC
  Filled 2017-10-27: qty 2

## 2017-10-27 MED ORDER — LIDOCAINE HCL (PF) 1 % IJ SOLN
INTRAMUSCULAR | Status: DC | PRN
  Administered 2017-10-27: 20 mL

## 2017-10-27 MED ORDER — LIDOCAINE HCL (PF) 1 % IJ SOLN
INTRAMUSCULAR | Status: AC
  Filled 2017-10-27: qty 30

## 2017-10-27 MED ORDER — MIDAZOLAM HCL 2 MG/2ML IJ SOLN
INTRAMUSCULAR | Status: DC | PRN
  Administered 2017-10-27: 1 mg via INTRAVENOUS

## 2017-10-27 MED ORDER — SODIUM CHLORIDE 0.9 % WEIGHT BASED INFUSION
1.0000 mL/kg/h | INTRAVENOUS | Status: DC
  Administered 2017-10-27: 1 mL/kg/h via INTRAVENOUS

## 2017-10-27 MED ORDER — HEPARIN (PORCINE) IN NACL 1000-0.9 UT/500ML-% IV SOLN
INTRAVENOUS | Status: AC
  Filled 2017-10-27: qty 1000

## 2017-10-27 MED ORDER — SODIUM CHLORIDE 0.9% FLUSH
3.0000 mL | Freq: Two times a day (BID) | INTRAVENOUS | Status: DC
  Administered 2017-10-28 (×2): 3 mL via INTRAVENOUS

## 2017-10-27 MED ORDER — TICAGRELOR 90 MG PO TABS
ORAL_TABLET | ORAL | Status: DC | PRN
  Administered 2017-10-27: 180 mg via ORAL

## 2017-10-27 MED ORDER — HYDROMORPHONE HCL 1 MG/ML IJ SOLN: 0.5000 mg | INTRAMUSCULAR | Status: DC | PRN

## 2017-10-27 MED ORDER — NITROGLYCERIN 0.4 MG SL SUBL: 0.4000 mg | SUBLINGUAL_TABLET | SUBLINGUAL | Status: DC | PRN

## 2017-10-27 MED ORDER — BIVALIRUDIN BOLUS VIA INFUSION - CUPID
INTRAVENOUS | Status: DC | PRN
  Administered 2017-10-27: 61.2 mg via INTRAVENOUS

## 2017-10-27 MED ORDER — LORAZEPAM 2 MG/ML IJ SOLN
1.0000 mg | Freq: Once | INTRAMUSCULAR | Status: AC
  Administered 2017-10-27: 1 mg via INTRAVENOUS
  Filled 2017-10-27: qty 1

## 2017-10-27 MED ORDER — HYDROMORPHONE HCL 1 MG/ML IJ SOLN
0.5000 mg | Freq: Once | INTRAMUSCULAR | Status: AC
  Administered 2017-10-27: 0.5 mg via INTRAVENOUS
  Filled 2017-10-27: qty 0.5

## 2017-10-27 MED ORDER — SODIUM CHLORIDE 0.9 % IV SOLN
250.0000 mL | INTRAVENOUS | Status: DC | PRN
  Administered 2017-10-28: 250 mL via INTRAVENOUS

## 2017-10-27 MED ORDER — VERAPAMIL HCL 2.5 MG/ML IV SOLN
INTRAVENOUS | Status: AC
  Filled 2017-10-27: qty 2

## 2017-10-27 MED ORDER — ATORVASTATIN CALCIUM 80 MG PO TABS: 80.0000 mg | ORAL_TABLET | Freq: Every evening | ORAL | Status: DC

## 2017-10-27 MED ORDER — SODIUM CHLORIDE 0.9% FLUSH: 3.0000 mL | INTRAVENOUS | Status: DC | PRN

## 2017-10-27 MED ORDER — BIVALIRUDIN TRIFLUOROACETATE 250 MG IV SOLR
INTRAVENOUS | Status: AC
  Filled 2017-10-27: qty 250

## 2017-10-27 MED ORDER — NITROGLYCERIN 1 MG/10 ML FOR IR/CATH LAB
INTRA_ARTERIAL | Status: AC
  Filled 2017-10-27: qty 10

## 2017-10-27 MED ORDER — SODIUM CHLORIDE 0.9 % IV BOLUS
1000.0000 mL | Freq: Once | INTRAVENOUS | Status: AC
  Administered 2017-10-27: 1000 mL via INTRAVENOUS

## 2017-10-27 MED ORDER — ACETAMINOPHEN 325 MG PO TABS: 650.0000 mg | ORAL_TABLET | ORAL | Status: DC | PRN

## 2017-10-27 MED ORDER — FENTANYL CITRATE (PF) 100 MCG/2ML IJ SOLN
INTRAMUSCULAR | Status: DC | PRN
  Administered 2017-10-27: 25 ug via INTRAVENOUS

## 2017-10-27 MED ORDER — ATROPINE SULFATE 1 MG/10ML IJ SOSY
PREFILLED_SYRINGE | INTRAMUSCULAR | Status: AC
  Filled 2017-10-27: qty 10

## 2017-10-27 MED ORDER — MIDAZOLAM HCL 2 MG/2ML IJ SOLN
INTRAMUSCULAR | Status: AC
  Filled 2017-10-27: qty 2

## 2017-10-27 MED ORDER — FENTANYL CITRATE (PF) 100 MCG/2ML IJ SOLN
INTRAMUSCULAR | Status: AC
  Filled 2017-10-27: qty 2

## 2017-10-27 MED ORDER — IOPAMIDOL (ISOVUE-370) INJECTION 76%
INTRAVENOUS | Status: AC
  Filled 2017-10-27: qty 125

## 2017-10-27 MED ORDER — HEPARIN SODIUM (PORCINE) 5000 UNIT/ML IJ SOLN
4000.0000 [IU] | Freq: Once | INTRAMUSCULAR | Status: AC
  Administered 2017-10-27: 4000 [IU] via INTRAVENOUS

## 2017-10-27 MED ORDER — ENOXAPARIN SODIUM 40 MG/0.4ML ~~LOC~~ SOLN
40.0000 mg | SUBCUTANEOUS | Status: DC
  Filled 2017-10-27: qty 0.4

## 2017-10-27 MED ORDER — ASPIRIN EC 81 MG PO TBEC
81.0000 mg | DELAYED_RELEASE_TABLET | Freq: Every day | ORAL | Status: DC
  Administered 2017-10-28: 81 mg via ORAL
  Filled 2017-10-27: qty 1

## 2017-10-27 MED ORDER — TICAGRELOR 90 MG PO TABS
90.0000 mg | ORAL_TABLET | Freq: Two times a day (BID) | ORAL | Status: DC
  Administered 2017-10-28: 90 mg via ORAL
  Filled 2017-10-27: qty 1

## 2017-10-27 MED ORDER — ASPIRIN 81 MG PO TBEC: 81.0000 mg | DELAYED_RELEASE_TABLET | Freq: Every day | ORAL | Status: DC

## 2017-10-27 MED ORDER — HEPARIN (PORCINE) IN NACL 2-0.9 UNITS/ML
INTRAMUSCULAR | Status: AC | PRN
  Administered 2017-10-27 (×2): 500 mL via INTRA_ARTERIAL

## 2017-10-27 MED ORDER — IOPAMIDOL (ISOVUE-370) INJECTION 76%
INTRAVENOUS | Status: DC | PRN
  Administered 2017-10-27: 115 mL via INTRA_ARTERIAL

## 2017-10-27 SURGICAL SUPPLY — 18 items
BALLN SAPPHIRE 2.5X12 (BALLOONS) ×2
BALLN ~~LOC~~ EUPHORA RX 4.5X8 (BALLOONS) ×2
BALLOON SAPPHIRE 2.5X12 (BALLOONS) IMPLANT
BALLOON ~~LOC~~ EUPHORA RX 4.5X8 (BALLOONS) IMPLANT
CATH INFINITI 5FR MULTPACK ANG (CATHETERS) ×1 IMPLANT
CATH VISTA GUIDE 6FR XBLAD3.5 (CATHETERS) ×1 IMPLANT
COVER PRB 48X5XTLSCP FOLD TPE (BAG) IMPLANT
COVER PROBE 5X48 (BAG) ×2
KIT HEART LEFT (KITS) ×2 IMPLANT
KIT HEMO VALVE WATCHDOG (MISCELLANEOUS) ×1 IMPLANT
PACK CARDIAC CATHETERIZATION (CUSTOM PROCEDURE TRAY) ×2 IMPLANT
SHEATH PINNACLE 6F 10CM (SHEATH) ×1 IMPLANT
STENT SYNERGY DES 4X12 (Permanent Stent) ×1 IMPLANT
SYR MEDRAD MARK V 150ML (SYRINGE) ×2 IMPLANT
TRANSDUCER W/STOPCOCK (MISCELLANEOUS) ×2 IMPLANT
TUBING CIL FLEX 10 FLL-RA (TUBING) ×2 IMPLANT
WIRE ASAHI PROWATER 180CM (WIRE) ×1 IMPLANT
WIRE EMERALD 3MM-J .035X150CM (WIRE) ×1 IMPLANT

## 2017-10-27 NOTE — ED Notes (Signed)
Pt CBG was 153, notified Emmy(RN)

## 2017-10-27 NOTE — ED Notes (Signed)
Consent form signed, pt refusing 2nd IV. Pt aware of need for x2 IV sites.

## 2017-10-27 NOTE — H&P (Signed)
Cardiology Admission History and Physical:   Patient ID: Natasha Roach; MRN: [id number]; DOB: August 18, 1960   Admission date: 10/27/2017  Primary Care Provider: Patient, No Pcp Per Primary Cardiologist: Yvonne Kendall, MD   Chief Complaint:  Chest pain  Patient Profile:   Natasha Roach is a 57 y.o. 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   History of Present Illness:   Natasha Roach resents with chest pain that began around 1230 today.  She has a history of cocaine use for the last 30 years with last use being at 10:00 this morning.  Her pain is central and radiates to her mid back.  She denies shortness of breath.  She is writhing around on the stretcher and difficult to get information from.  She has a history of a cocaine induced MI in 2004, cocaine induced and STEMI in 12/2015, STEMI involving the mid LAD secondary to cocaine use in 03/2016.  In December 2017 she developed ventricular tachycardia shortly after initial reperfusion requiring ventricular defibrillation.  She was treated for 1 day with amiodarone.  On the day of discharge her EKG showed significant ST-T changes and QT prolongation for which she was treated with mag sulfate.  These changes were thought to be possibly related to vasospastic phenomenon from her recent cocaine use.  Her isosorbide was increased and she was asymptomatic.  Unfortunately the patient left AMA after her stent was placed and she has had no cardiology follow-up since that time.  The patient states that she was set up with the clinic in [facility], however she never went as she does not trust them.  She admits to using cocaine this morning at around 10 AM.  She states that she is used cocaine for the past over 30 years.  She donated plasma this morning to get money to buy a 20 bag of cocaine. it is very difficult to get any  detailed information from her.  Past Medical History:  Diagnosis Date  . CAD (coronary artery disease) 04/23/2016   a. suspected cocaine induced NSTEMI 12/2015; pt declined cath. b. STEMI 03/2016 due to cocaine, occ mLAD s/p Synergy stent, EF 40% by cath, 55% by echo.  . Cocaine abuse   . H/O noncompliance with medical treatment, presenting hazards to health   . HTN (hypertension)   . Hyperlipidemia LDL goal <70   . Ischemic cardiomyopathy   . 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 Morbid obesity (HCC)   . Prolonged QT interval 04/23/2016  . Substance abuse   . Tobacco abuse   . Ventricular tachycardia, sustained (HCC)    a. at time of STEMI 03/2016.    Past Surgical History:  Procedure Laterality Date  . ABDOMINAL HYSTERECTOMY    . BREAST SURGERY     abcess  . CARDIAC CATHETERIZATION  2004  . CARDIAC CATHETERIZATION N/A 04/21/2016   Procedure: Left Heart Cath and Coronary Angiography;  Surgeon: Lennette Bihari, MD;  Location[facility] INVAS[facility] CV LAB;  Service: Cardiovascular;  Laterality: N/A;  . CARDIAC CATHETERIZATION N/A 04/21/2016   Procedure: Coronary Stent Intervention;  Surgeon: Lennette Bihari, MD;  Location: [facility] INVASIVE CV LAB;  Service: Cardiovascular;  Laterality: N/A;  . ELECTROPHYSIOLOGIC STUDY N/A 04/21/2016   Procedure: Cardioversion;  Surgeon: Lennette Bihari, MD;  Location: [facility] INVASIVE CV LAB;  Service: Cardiovascular;  Laterality: N/A;  Medications Prior to Admission: Prior to Admission medications   Medication Sig Start Date End Date Taking? Authorizing Provider  aspirin 81 MG EC tablet Take 1 tablet (81 mg total) by mouth daily. 04/23/16   Laurann Montana, PA-C  atorvastatin (LIPITOR) 80 MG tablet Take 1 tablet (80 mg total) by mouth every evening. 09/16/16   Iran Ouch, Lennart Pall, PA-C  clopidogrel (PLAVIX) 75 MG tablet Take 1 tablet (75 mg total) by mouth daily. 05/02/16   Barrett, Joline Salt, PA-C  isosorbide mononitrate (IMDUR) 60 MG 24 hr  tablet TAKE 1 TABLET (60 MG TOTAL) BY MOUTH DAILY. 07/07/16   Lennette Bihari, MD  lisinopril (PRINIVIL,ZESTRIL) 2.5 MG tablet Take 1 tablet (2.5 mg total) by mouth daily. 07/07/16   Lennette Bihari, MD  nitroGLYCERIN (NITROSTAT) 0.4 MG SL tablet Place 1 tablet (0.4 mg total) under the tongue every 5 (five) minutes as needed for chest pain. Up to 3 doses 04/23/16   Dunn, Tacey Ruiz, PA-C     Allergies:   No Known Allergies  Social History:   Social History   Socioeconomic History  . Marital status: Significant Other    Spouse name: Not on file  . Number of children: Not on file  . Years of education: Not on file  . Highest education level: Not on file  Occupational History  . Not on file  Social Needs  . Financial resource strain: Not on file  . Food insecurity:    Worry: Not on file    Inability: Not on file  . Transportation needs:    Medical: Not on file    Non-medical: Not on file  Tobacco Use  . Smoking status: Current Every Day Smoker    Packs/day: 1.00    Years: 40.00    Pack years: 40.00    Types: Cigarettes  . Smokeless tobacco: Never Used  Substance and Sexual Activity  . Alcohol use: No  . Drug use: Yes    Types: Cocaine    Comment: last use 04/21/16  . Sexual activity: Yes  Lifestyle  . Physical activity:    Days per week: Not on file    Minutes per session: Not on file  . Stress: Not on file  Relationships  . Social connections:    Talks on phone: Not on file    Gets together: Not on file    Attends religious service: Not on file    Active member of club or organization: Not on file    Attends meetings of clubs or organizations: Not on file    Relationship status: Not on file  . Intimate partner violence:    Fear of current or ex partner: Not on file    Emotionally abused: Not on file    Physically abused: Not on file    Forced sexual activity: Not on file  Other Topics Concern  . Not on file  Social History Narrative  . Not on file    Family  History:   The patient's family history includes CAD in her father.    ROS:  Please see the history of present illness.  All other ROS reviewed and negative.     Physical Exam/Data:   Vitals:   10/27/17 1527 10/27/17 1529  BP: 98/71   Pulse: (!) 55   Resp: 16   Temp: (!) 97.4 F (36.3 C)   TempSrc: Oral   SpO2: 97%   Weight:  180 lb (81.6 kg)  Height:  5\' 7"  (  1.702 m)   No intake or output data in the 24 hours ending 10/27/17 1630 Filed Weights   10/27/17 1529  Weight: 180 lb (81.6 kg)   Body mass index is 28.19 kg/m.  General:  Well nourished, well developed, writhing on stretcher HEENT: normal Lymph: no adenopathy Neck: no JVD Endocrine:  No thryomegaly Vascular: No carotid bruits; FA pulses 2+ bilaterally without bruits  Cardiac:  normal S1, S2; RRR; no murmur  Lungs:  clear to auscultation bilaterally, no wheezing, rhonchi or rales  Abd: soft, nontender, no hepatomegaly  Ext: no edema Musculoskeletal:  No deformities, BUE and BLE strength normal and equal Skin: warm and dry  Neuro:  CNs 2-12 intact, no focal abnormalities noted Psych:  Normal affect    EKG:  The ECG that was done  was personally reviewed and demonstrates sinus rhythm with inferior ST elevation  Relevant CV Studies:  Echocardiogram 04/22/2016 - Left ventricle: The cavity size was normal. Wall thickness was   increased in a pattern of mild LVH. The estimated ejection   fraction was 55%. Although no diagnostic regional wall motion   abnormality was identified, this possibility cannot be completely   excluded on the basis of this study. Doppler parameters are   consistent with abnormal left ventricular relaxation (grade 1   diastolic dysfunction). - Aortic valve: Poorly visualized. Probably trileaflet. There was   no stenosis. - Mitral valve: There was no significant regurgitation. - Right ventricle: The cavity size was normal. Systolic function   was normal. - Pulmonary arteries: PA peak  pressure: 24 mm Hg (S). - Systemic veins: IVC measured 1.9 cm with < 50% respirophasic   variation, suggesting RA pressure 8 mmHg.  Impressions: - Technically difficult study with poor acoustic windows. Normal LV   size with mild LV hypertrophy. EF 55%. Normal RV size and   systolic function. No significant valvular abnormalities.  Cardioversion  Coronary Stent Intervention  Left Heart Cath and Coronary Angiography 04/21/2016  Conclusion    There is mild to moderate left ventricular systolic dysfunction.  LV end diastolic pressure is mildly elevated.  Mid LAD-1 lesion, 20 %stenosed.  A STENT SYNERGY DES 3.5X20 drug eluting stent was successfully placed.  Mid LAD-2 lesion, 100 %stenosed.  Post intervention, there is a 0% residual stenosis.   Acute ST segment elevation myocardial infarction occurring shortly after crack cocaine use, secondary to total occlusion of the proximal LAD with initial TIMI 0 flow and no evidence for collateralization.  Normal left circumflex and small right coronary arteries.  Reperfusion induced ventricular tachycardia requiring cardioversion/defibrillation and amiodarone 300 mg bolus.  Successful PCI to the totally occluded LAD with ultimate insertion of a Synergy 3.520 mm DES stent postdilated to 3.78 mm with the 100% occlusion being reduced to 0% and resumption of TIMI-3 flow in a very large LAD vessel which wrapped around the apex and supplied up to the midportion of the inferior wall.  RECOMMENDATION: The patient will continue on dual antiplatelet therapy ideally for a minimum of 1 year following this ST segment elevation MI.  A Synergy DES stent was used in the event she has reduced medical compliance in her large LAD vessel.  A lengthy discussion was done concerning the importance of complete discontinuance of cocaine use and the importance of continuation of medical therapy.       Laboratory Data:  ChemistryNo results for input(s):  NA, K, CL, CO2, GLUCOSE, BUN, CREATININE, CALCIUM, GFRNONAA, GFRAA, ANIONGAP in the last 168 hours.  No results for input(s): PROT, ALBUMIN, AST, ALT, ALKPHOS, BILITOT in the last 168 hours. Hematology Recent Labs  Lab 10/27/17 1556  WBC 16.7*  RBC 5.47*  HGB 14.9  HCT 48.0*  MCV 87.8  MCH 27.2  MCHC 31.0  RDW 12.5  PLT 188   Cardiac EnzymesNo results for input(s): TROPONINI in the last 168 hours.  Recent Labs  Lab 10/27/17 1559  TROPIPOC 0.00    BNPNo results for input(s): BNP, PROBNP in the last 168 hours.  DDimer No results for input(s): DDIMER in the last 168 hours.  Radiology/Studies:  Dg Chest Port 1 View  Result Date: 10/27/2017 CLINICAL DATA:  Chest pain EXAM: PORTABLE CHEST 1 VIEW COMPARISON:  December 31, 2015 FINDINGS: The heart size and mediastinal contours are within normal limits. Both lungs are clear. The visualized skeletal structures are unremarkable. IMPRESSION: No active disease. Electronically Signed   By: Gerome Samavid  Williams III M.D   On: 10/27/2017 16:04    Assessment and Plan:   STEMI -Patient with significant history of multiple MIs related to cocaine use.  She last had STEMI with VT and DES to the LAD in 03/2016.  The patient has had no follow-up and is likely not to have been taking any of her prescribed medications. -The patient developed chest pain at around 1230 after having done cocaine at around 10 AM.  Her pain is midsternal going through to her back.  She denies dyspnea.  She appears very uncomfortable and is moving quite a bit on the stretcher. -EKG with ST elevations inferiorly.  Troponin is negative -Bmet is pending -Patient taken to the Cath Lab from the ED for STEMI by Dr. [place]  Hypertension -Patient was hypotensive on presentation 98/71, receiving IV resuscitation -The patient was initiated on Imdur 60 mg, lisinopril 2.5 mg in the past, however does not appear that she has been taking her medications.  Hyperlipidemia -Lipids last  checked in 03/2016 with LDL of 97.  The patient has been ordered atorvastatin 80 mg, however it is very unlikely that she has been taking her medications. -We will attempt to initiate statin therapy based on her cath findings.  Substance abuse  -Patient with 30-year history of cocaine use and admitted cocaine use this morning prior to her chest pain. -Patient advised on abstinence, however it is unlikely that this will occur   Severity of Illness: The appropriate patient status for this patient is OBSERVATION. Observation status is judged to be reasonable and necessary in order to provide the required intensity of service to ensure the patient's safety. The patient's presenting symptoms, physical exam findings, and initial radiographic and laboratory data in the context of their medical condition is felt to place them at decreased risk for further clinical deterioration. Furthermore, it is anticipated that the patient will be medically stable for discharge from the hospital within 2 midnights of admission. The following factors support the patient status of observation.   " The patient's presenting symptoms include chest pain. " The physical exam findings include anxious. " The initial radiographic and laboratory data are ST elevation on EKG, troponin negative.     For questions or updates, please contact [facility] Please consult [email or web address] for contact info under Cardiology/STEMI.    Signed, Berton BonJanine Galen Russman, NP  10/27/2017 4:30 PM

## 2017-10-27 NOTE — ED Provider Notes (Signed)
[facility] EMERGENCY DEPARTMENT Provider Note   CSN: [id number] Arrival date & time: 10/27/17  1522     History   Chief Complaint No chief complaint on file.   HPI Natasha Roach is a 57 y.o. female.  She presents today complaining of central chest pain radiating through to her back that started around 12 PM today with associated nausea and dizziness.  She states it all started with being diaphoretic.  She had just donated plasma earlier today.  She states she is had 3 heart attacks and this does not feel like that.  She received aspirin by EMS and a nitro which dropped her blood pressure.  Here she shows up with continued active chest pain and feeling like she is falling asleep.  On further questioning patient admits to using a 20 bag of cocaine that she smoked earlier this morning.  She again states that she has had this on and off chest pain for a while and it usually responds to drinking some fluids that she thinks it might be GI.  The history is provided by the patient.  Chest Pain   This is a new problem. The current episode started 3 to 5 hours ago. The problem occurs constantly. The problem has not changed since onset.The pain is present in the substernal region. The pain is severe. The quality of the pain is described as pressure-like. The pain radiates to the mid back and upper back. Associated symptoms include back pain, diaphoresis, dizziness and nausea. Pertinent negatives include no abdominal pain, no cough, no fever, no headaches, no hemoptysis, no leg pain, no numbness, no palpitations, no shortness of breath, no sputum production, no syncope and no vomiting. She has tried nitroglycerin for the symptoms. The treatment provided no relief.  Her past medical history is significant for CAD, hyperlipidemia and hypertension.  Pertinent negatives for past medical history include no seizures.    Past Medical History:  Diagnosis Date  . CAD (coronary  artery disease) 04/23/2016   a. suspected cocaine induced NSTEMI 12/2015; pt declined cath. b. STEMI 03/2016 due to cocaine, occ mLAD s/p Synergy stent, EF 40% by cath, 55% by echo.  . Cocaine abuse   . H/O noncompliance with medical treatment, presenting hazards to health   . HTN (hypertension)   . Hyperlipidemia LDL goal <70   . Ischemic cardiomyopathy   . 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 Morbid obesity (HCC)   . Prolonged QT interval 04/23/2016  . Substance abuse   . Tobacco abuse   . Ventricular tachycardia, sustained (HCC)    a. at time of STEMI 03/2016.    Patient Active Problem List   Diagnosis Date Noted  . Prolonged QT interval 04/23/2016  . CAD (coronary artery disease) 04/23/2016  . H/O noncompliance with medical treatment, presenting hazards to health 04/23/2016  . Hyperlipidemia LDL goal <70   . Ischemic cardiomyopathy   . Acute ST elevation myocardial infarction (STEMI) due to occlusion of left coronary artery (HCC) 04/21/2016  . HTN (hypertension) 04/21/2016  . Diabetes type 2, controlled (HCC) 04/21/2016  . Tobacco abuse 04/21/2016  . Ventricular tachycardia, sustained (HCC) 04/21/2016  . Morbid obesity (HCC) 01/01/2016  . Acute coronary syndrome (HCC) 12/31/2015  . Cocaine abuse (HCC) 12/31/2015  . Leukocytosis 12/31/2015    Past Surgical History:  Procedure Laterality Date  . ABDOMINAL HYSTERECTOMY    . BREAST SURGERY     abcess  . CARDIAC CATHETERIZATION  2004  . CARDIAC CATHETERIZATION N/A 04/21/2016   Procedure: Left Heart Cath and Coronary Angiography;  Surgeon: Lennette Bihari, MD;  Location: [facility] INVASIVE CV LAB;  Service: Cardiovascular;  Laterality: N/A;  . CARDIAC CATHETERIZATION N/A 04/21/2016   Procedure: Coronary Stent Intervention;  Surgeon: Lennette Bihari, MD;  Location: [facility] INVASIVE CV LAB;  Service: Cardiovascular;  Laterality: N/A;  . ELECTROPHYSIOLOGIC STUDY N/A 04/21/2016   Procedure: Cardioversion;  Surgeon:  Lennette Bihari, MD;  Location: [facility] INVASIVE CV LAB;  Service: Cardiovascular;  Laterality: N/A;     OB History   None      Home Medications    Prior to Admission medications   Medication Sig Start Date End Date Taking? Authorizing Provider  aspirin 81 MG EC tablet Take 1 tablet (81 mg total) by mouth daily. 04/23/16   Laurann Montana, PA-C  atorvastatin (LIPITOR) 80 MG tablet Take 1 tablet (80 mg total) by mouth every evening. 09/16/16   Iran Ouch, Lennart Pall, PA-C  clopidogrel (PLAVIX) 75 MG tablet Take 1 tablet (75 mg total) by mouth daily. 05/02/16   Barrett, Joline Salt, PA-C  isosorbide mononitrate (IMDUR) 60 MG 24 hr tablet TAKE 1 TABLET (60 MG TOTAL) BY MOUTH DAILY. 07/07/16   Lennette Bihari, MD  lisinopril (PRINIVIL,ZESTRIL) 2.5 MG tablet Take 1 tablet (2.5 mg total) by mouth daily. 07/07/16   Lennette Bihari, MD  nitroGLYCERIN (NITROSTAT) 0.4 MG SL tablet Place 1 tablet (0.4 mg total) under the tongue every 5 (five) minutes as needed for chest pain. Up to 3 doses 04/23/16   Laurann Montana, PA-C    Family History Family History  Problem Relation Age of Onset  . CAD Father     Social History Social History   Tobacco Use  . Smoking status: Current Every Day Smoker    Packs/day: 1.00    Years: 40.00    Pack years: 40.00    Types: Cigarettes  . Smokeless tobacco: Never Used  Substance Use Topics  . Alcohol use: No  . Drug use: Yes    Types: Cocaine    Comment: last use 04/21/16     Allergies   Patient has no known allergies.   Review of Systems Review of Systems  Constitutional: Positive for diaphoresis and fatigue. Negative for chills and fever.  HENT: Negative for ear pain and sore throat.   Eyes: Negative for pain and visual disturbance.  Respiratory: Negative for cough, hemoptysis, sputum production and shortness of breath.   Cardiovascular: Positive for chest pain. Negative for palpitations and syncope.  Gastrointestinal: Positive for nausea. Negative for abdominal  pain and vomiting.  Genitourinary: Negative for dysuria and hematuria.  Musculoskeletal: Positive for back pain. Negative for arthralgias.  Skin: Negative for color change and rash.  Neurological: Positive for dizziness. Negative for seizures, syncope, numbness and headaches.  All other systems reviewed and are negative.    Physical Exam Updated Vital Signs BP 98/71 (BP Location: Right Arm)   Pulse (!) 55   Temp (!) 97.4 F (36.3 C) (Oral)   Resp 16   Ht 5\' 7"  (1.702 m)   Wt 81.6 kg (180 lb)   SpO2 97%   BMI 28.19 kg/m   Physical Exam  Constitutional: She is oriented to person, place, and time. She appears well-developed and well-nourished. No distress.  HENT:  Head: Normocephalic and atraumatic.  Right Ear: External ear normal.  Left Ear: External ear normal.  Nose: Nose normal.  Mouth/Throat: Oropharyngeal exudate  present.  Eyes: Pupils are equal, round, and reactive to light. Conjunctivae and EOM are normal.  Neck: Neck supple.  Cardiovascular: Normal rate, regular rhythm, normal heart sounds and intact distal pulses.  No murmur heard. Pulmonary/Chest: Effort normal and breath sounds normal. No respiratory distress. She has no wheezes. She has no rales.  Abdominal: Soft. She exhibits no mass. There is no tenderness. There is no guarding.  Musculoskeletal: Normal range of motion. She exhibits no edema, tenderness or deformity.  Neurological: She is alert and oriented to person, place, and time. She has normal strength. No cranial nerve deficit or sensory deficit. GCS eye subscore is 4. GCS verbal subscore is 5. GCS motor subscore is 6.  Skin: Skin is warm and dry.  Psychiatric: She has a normal mood and affect.  Nursing note and vitals reviewed.    ED Treatments / Results  Labs (all labs ordered are listed, but only abnormal results are displayed) Labs Reviewed  COMPREHENSIVE METABOLIC PANEL - Abnormal; Notable for the following components:      Result Value    Chloride 112 (*)    Glucose, Bld 165 (*)    Calcium 8.5 (*)    Total Protein 4.8 (*)    Albumin 3.2 (*)    All other components within normal limits  CBC WITH DIFFERENTIAL/PLATELET - Abnormal; Notable for the following components:   WBC 16.7 (*)    RBC 5.47 (*)    HCT 48.0 (*)    Neutro Abs 13.1 (*)    All other components within normal limits  BASIC METABOLIC PANEL - Abnormal; Notable for the following components:   Calcium 8.7 (*)    All other components within normal limits  CBC - Abnormal; Notable for the following components:   WBC 17.1 (*)    RBC 5.14 (*)    All other components within normal limits  CBC - Abnormal; Notable for the following components:   WBC 17.6 (*)    RBC 5.52 (*)    HCT 48.6 (*)    All other components within normal limits  TROPONIN I - Abnormal; Notable for the following components:   Troponin I 0.27 (*)    All other components within normal limits  TROPONIN I - Abnormal; Notable for the following components:   Troponin I 1.41 (*)    All other components within normal limits  TROPONIN I - Abnormal; Notable for the following components:   Troponin I 2.92 (*)    All other components within normal limits  LIPID PANEL - Abnormal; Notable for the following components:   HDL 37 (*)    All other components within normal limits  CBG MONITORING, ED - Abnormal; Notable for the following components:   Glucose-Capillary 153 (*)    All other components within normal limits  POCT I-STAT, CHEM 8 - Abnormal; Notable for the following components:   Glucose, Bld 141 (*)    Calcium, Ion 1.14 (*)    TCO2 20 (*)    All other components within normal limits  MRSA PCR SCREENING  LIPASE, BLOOD  HIV ANTIBODY (ROUTINE TESTING)  CREATININE, SERUM  I-STAT TROPONIN, ED  POCT ACTIVATED CLOTTING TIME  POCT ACTIVATED CLOTTING TIME    EKG EKG Interpretation  Date/Time:  Friday October 27 2017 15:43:51 EDT Ventricular Rate:  53 PR Interval:    QRS Duration: 85 QT  Interval:  494 QTC Calculation: 464 R Axis:   27 Text Interpretation:  Sinus rhythm RSR' in V1 or V2,  right VCD or RVH Inferior infarct, acute (LCx) similar to earlier Confirmed by Meridee Score [phone]) on 10/27/2017 3:51:46 PM   Radiology Dg Chest Port 1 View  Result Date: 10/27/2017 CLINICAL DATA:  Chest pain EXAM: PORTABLE CHEST 1 VIEW COMPARISON:  December 31, 2015 FINDINGS: The heart size and mediastinal contours are within normal limits. Both lungs are clear. The visualized skeletal structures are unremarkable. IMPRESSION: No active disease. Electronically Signed   By: Gerome Sam III M.D   On: 10/27/2017 16:04    Procedures .Critical Care Performed by: Terrilee Files, MD Authorized by: Terrilee Files, MD   Critical care provider statement:    Critical care time (minutes):  30   Critical care time was exclusive of:  Separately billable procedures and treating other patients   Critical care was necessary to treat or prevent imminent or life-threatening deterioration of the following conditions:  Cardiac failure   Critical care was time spent personally by me on the following activities:  Development of treatment plan with patient or surrogate, discussions with consultants, evaluation of patient's response to treatment, examination of patient, obtaining history from patient or surrogate, ordering and performing treatments and interventions, ordering and review of laboratory studies, ordering and review of radiographic studies, pulse oximetry, re-evaluation of patient's condition and review of old charts   I assumed direction of critical care for this patient from another provider in my specialty: no     (including critical care time)  Medications Ordered in ED Medications  sodium chloride 0.9 % bolus 1,000 mL (has no administration in time range)     Initial Impression / Assessment and Plan / ED Course  I have reviewed the triage vital signs and the nursing  notes.  Pertinent labs & imaging results that were available during my care of the patient were reviewed by me and considered in my medical decision making (see chart for details).  Clinical Course as of Oct 29 927  Fri Oct 27, 2017  1551 Repeated her EKG and still has some suspicious ST elevation inferiorly although the shape of the ST segment does not look classic.  I paged the STEMI attending to have them review her EKGs.   [MB]  1600 Discussed with Dr. Sw[place] from interventional cardiology who recommends activating this is a STEMI due to her known disease and active cocaine use with newly abnormal EKG.   [MB]    Clinical Course User Index [MB] Terrilee Files, MD     Final Clinical Impressions(s) / ED Diagnoses   Final diagnoses:  ST elevation myocardial infarction (STEMI), unspecified artery [facility])  Cocaine abuse [facility])    ED Discharge Orders    None       Terrilee Files, MD 10/28/17 [phone]

## 2017-10-27 NOTE — ED Triage Notes (Signed)
Pt arrives via [facility], centralized chest pain 1200 non-radiating with nausea and dizziness, diaphoresis at onset, no SOB. Previous hx of MI in 2018 (drug related, denies drug use today). States she donated plasma today. Took 650 asprin PTA, 1 nitro with [facility] which dropped pressure to 92/58. Received approx 250 fluid en route. IV L hand, 20 gage/  With EMS NSR HR 49, 97% on RA

## 2017-10-27 NOTE — Progress Notes (Signed)
Right femoral sheath present level 0 on assessment. ACT 136 and b/p medication given per primary RN prior to pulling sheath out. Pt lying flat in bed before pulling sheath out, anxiety medication given. Pt instructed to breath in and out and do not move leg. Blood pulled back and no blood clots noted. Suture removed and sheath pulled out. Pressure applied for 15 minutes (level 0); however pt started coughing and site started bleeding again. Pressure applied for 20 more minutes until level 0 achieved at site. Pain medication given and VS WNL during procedure. Site covered with gauze and pressure dressing. Pt educated on placing hand over site when coughing, on bedrest, and calling nurse if site starts bleeding again. Primary RN will continue to monitor pt closely.

## 2017-10-27 NOTE — ED Notes (Signed)
[facility] called @ 1558 (Activate Code Stemi)-per Dr. Lamar SprinklesButler-called by Marylene LandAngela

## 2017-10-27 NOTE — ED Notes (Addendum)
Brief (approximately 10 minute) delay to cath lab due to pt's anxieties concerning procedure and hesitation to sign consent form. Procedure discussed with pt in detail by cardiologist and APRN. This RN used therapeutic communication to convince pt to sign consent witnessed by second RN and gave assent to go to cath lab

## 2017-10-28 ENCOUNTER — Observation Stay (HOSPITAL_BASED_OUTPATIENT_CLINIC_OR_DEPARTMENT_OTHER): Payer: Self-pay

## 2017-10-28 DIAGNOSIS — I2510 Atherosclerotic heart disease of native coronary artery without angina pectoris: Secondary | ICD-10-CM

## 2017-10-28 DIAGNOSIS — E1159 Type 2 diabetes mellitus with other circulatory complications: Secondary | ICD-10-CM

## 2017-10-28 DIAGNOSIS — F1410 Cocaine abuse, uncomplicated: Secondary | ICD-10-CM

## 2017-10-28 DIAGNOSIS — I2102 ST elevation (STEMI) myocardial infarction involving left anterior descending coronary artery: Secondary | ICD-10-CM

## 2017-10-28 LAB — BASIC METABOLIC PANEL
ANION GAP: 7 (ref 5–15)
BUN: 6 mg/dL (ref 6–20)
CALCIUM: 8.7 mg/dL — AB (ref 8.9–10.3)
CHLORIDE: 111 mmol/L (ref 98–111)
CO2: 23 mmol/L (ref 22–32)
CREATININE: 0.77 mg/dL (ref 0.44–1.00)
GFR calc non Af Amer: >60 mL/min (ref >60)
Glucose, Bld: 96 mg/dL (ref 70–99)
Potassium: 4 mmol/L (ref 3.5–5.1)
SODIUM: 141 mmol/L (ref 135–145)

## 2017-10-28 LAB — CBC
HCT: 44.5 % (ref 36.0–46.0)
Hemoglobin: 14.1 g/dL (ref 12.0–15.0)
MCH: 27.4 pg (ref 26.0–34.0)
MCHC: 31.7 g/dL (ref 30.0–36.0)
MCV: 86.6 fL (ref 78.0–100.0)
Platelets: 179 10*3/uL (ref 150–400)
RBC: 5.14 MIL/uL — AB (ref 3.87–5.11)
RDW: 12.6 % (ref 11.5–15.5)
WBC: 17.1 10*3/uL — AB (ref 4.0–10.5)

## 2017-10-28 LAB — LIPID PANEL
Cholesterol: 137 mg/dL (ref 0–200)
HDL: 37 mg/dL — ABNORMAL LOW (ref >40)
LDL Cholesterol: 71 mg/dL (ref 0–99)
Total CHOL/HDL Ratio: 3.7 RATIO
Triglycerides: 143 mg/dL (ref <150)
VLDL: 29 mg/dL (ref 0–40)

## 2017-10-28 LAB — ECHOCARDIOGRAM COMPLETE
Height: 67 in
WEIGHTICAEL: 2892.44 [oz_av]

## 2017-10-28 LAB — TROPONIN I
TROPONIN I: 1.41 ng/mL — AB (ref <0.03)
Troponin I: 2.92 ng/mL (ref <0.03)

## 2017-10-28 LAB — MRSA PCR SCREENING: MRSA by PCR: NEGATIVE

## 2017-10-28 LAB — HIV ANTIBODY (ROUTINE TESTING W REFLEX): HIV Screen 4th Generation wRfx: NONREACTIVE

## 2017-10-28 MED ORDER — ATORVASTATIN CALCIUM 80 MG PO TABS: 80.0000 mg | ORAL_TABLET | Freq: Every evening | ORAL | 11 refills | Status: DC

## 2017-10-28 MED ORDER — CLOPIDOGREL BISULFATE 300 MG PO TABS
300.0000 mg | ORAL_TABLET | Freq: Once | ORAL | Status: AC
  Administered 2017-10-28: 300 mg via ORAL
  Filled 2017-10-28: qty 1

## 2017-10-28 MED ORDER — NITROGLYCERIN 0.4 MG SL SUBL: 0.4000 mg | SUBLINGUAL_TABLET | SUBLINGUAL | 11 refills | Status: DC | PRN

## 2017-10-28 MED ORDER — CLOPIDOGREL BISULFATE 75 MG PO TABS: 75.0000 mg | ORAL_TABLET | Freq: Every day | ORAL | 11 refills | Status: DC

## 2017-10-28 NOTE — Progress Notes (Signed)
  Echocardiogram 2D Echocardiogram has been performed.  Natasha Roach, Natasha Roach 10/28/2017, 12:53 PM

## 2017-10-28 NOTE — Progress Notes (Signed)
Patient asked RN "when can I leave?".  RN advised per MD notes there are transfer orders to telemetry but MD did not write discharge instructions.  Patient stated that she was led to believe she would be discharged today by cardiac rehab and that "she would not be able to stay".  MD Katrinka BlazingSmith rounding MD and per his note contacted [facility] via [email or web address] which is PA [employer].   1400 - [facility].  PA advised he will come to bedside and speak with patient.  RN went in to patient's room and advised PA was going to be coming bedside shortly to discuss patient leaving against medical advice.  Patient asked "can I go downstairs" RN asked patient why she wanted to go outside and patient stated "she wanted a cigarette"  RN advised patient that she could not go outside and that the PA would be to bedside shortly and patient stated "I'm going to leave, I'll take my chances".   RN discussed with patient groin site care, signs and symptoms of bleeding, and when to call 911.  IV site removed.     1415 - Paged PA Alben [employer] to inform of situation, patient had walked off unit and not willing to wait for PA.  PA stated he could be to unit in 15 minutes,  RN located patient in waiting room and asked patient if she could wait 15 minutes.  Patient agreed and returned to room.  PA [employer] paged via [email or web address] and informed of same.

## 2017-10-28 NOTE — Discharge Summary (Addendum)
The patient has been seen in conjunction with Tereso NewcomerScott Weaver, PAC. All aspects of care have been considered and discussed. The patient has been personally interviewed, examined, and all clinical data has been reviewed.   Patient left against medical advice.  Switched to Plavix from [employer] for affordability.  Patient is resistant to abstinence from tobacco and cocaine.  Hopefully, she will do well, take meds and f/u as recommended.  Discharge Summary - Patient Left AMA    Patient ID: Natasha Roach,  MRN: [id number], DOB/AGE: 1960/10/10 57 y.o.  Admit date: 10/27/2017 Discharge date: 10/28/2017  Primary Care Provider: Patient, No Pcp Per Primary Cardiologist: Nicki Guadalajarahomas Kelly, MD  Discharge Diagnoses    Principal Problem:   Acute ST elevation myocardial infarction (STEMI) involving left anterior descending (LAD) coronary artery [facility](HCC) Active Problems:   Cocaine abuse (HCC)   CAD (coronary artery disease)   Diabetes type 2, controlled (HCC)   Tobacco abuse   Hyperlipidemia LDL goal <70   H/O noncompliance with medical treatment, presenting hazards to health   Allergies No Known Allergies  Diagnostic Studies/Procedures    Echo 10/28/17 Study Conclusions  - Left ventricle: The cavity size was normal. Wall thickness was   increased in a pattern of mild LVH. Systolic function was normal.   The estimated ejection fraction was in the range of 60% to 65%.   Wall motion was normal; there were no regional wall motion   abnormalities. Indeterminate diastolic function.  Cardiac Catheterization 10/27/17 LM normal LAD prox 85, prox stent patent with 15 ISR LCx normal RCA normal EF 55-65 PCI:  4 x 12 mm Synergy DES to LAD 1. Single vessel obstructive CAD. Thrombotic lesion in the LAD just proximal to prior stent. 2. Good LV function 3. Normal LVEDP 4. Successful PCI of the LAD with DES x 1.  Recommend uninterrupted dual antiplatelet therapy with Aspirin 81mg  daily and  Ticagrelor 90mg  twice daily for a minimum of 12 months (ACS - Class I recommendation).  _____________   History of Present Illness     Natasha Roach is a 57 y.o. female with a hx of coronary artery disease s/p anterior STEMI in 03/2016 treated with PCI of the LAD complicated by VFib arrest with reperfusion.  She also has a history of cocaine abuse.  She used cocaine at 10 am on the date of admission.  She presented to the hospital with acute anterior chest pain.  ECG showed inferior ST elevation.  Code STEMI was called.  She was taken emergently to the cardiac catheterization lab.    Hospital Course     Consultants: none    Cardiac Catheterization demonstrated a patent stent in the LAD.  But, she did have a new 85% thrombotic lesion in the LAD prior to the stent.  The RCA and LCx were patent.  She underwent PCI with DES to the LAD.  Echo demonstrated EF 55-65%.  She was placed on Brilinta, ASA, high dose Lipitor.  She was seen this morning on rounds and was not having recurrent chest pain.  Later in the day, she told the nurse she was leaving AMA.  I did come to speak with the patient to explain the dangers of leaving the hospital early.  She understands the risk of leaving early (myocardial infarction, arrhythmia, congestive heart failure, bleeding, death) and is willing to take this risk.  She notes that she did not tolerate Brilinta in the past.  I am concerned she may have  trouble getting Brilinta.  I spoke with Dr. Swaziland who did her Cardiac Catheterization and PCI.  We will load her with Plavix 300 mg now and give her a prescription for Plavix 75 mg Once daily.  She will need to remain on ASA 81, Plavix 75, Lipitor 80.  If her BP is high enough at follow up, she can resume Lisinopril.  I will leave a message for her to be seen in the next week for follow up.  She was given prescriptions before leaving AMA.   **As noted, the patient left the hospital today AGAINST MEDICAL  ADVICE** _____________  Discharge Vitals Blood pressure (!) 160/74, pulse 68, temperature 98 F (36.7 C), temperature source Axillary, resp. rate (!) 22, height 5\' 7"  (1.702 m), weight 180 lb 12.4 oz (82 kg), SpO2 98 %.  Filed Weights   10/27/17 1529 10/28/17 0500  Weight: 180 lb (81.6 kg) 180 lb 12.4 oz (82 kg)    Labs & Radiologic Studies    CBC Recent Labs    10/27/17 1556  10/27/17 1829 10/28/17 0614  WBC 16.7*  --  17.6* 17.1*  NEUTROABS 13.1*  --   --   --   HGB 14.9   < > 15.0 14.1  HCT 48.0*   < > 48.6* 44.5  MCV 87.8  --  88.0 86.6  PLT 188  --  198 179   < > = values in this interval not displayed.   Basic Metabolic Panel Recent Labs    04/54/09 1556 10/27/17 1648 10/27/17 1829 10/28/17 0614  NA 142 143  --  141  K 4.0 3.6  --  4.0  CL 112* 109  --  111  CO2 24  --   --  23  GLUCOSE 165* 141*  --  96  BUN 8 8  --  6  CREATININE 0.83 0.60 0.82 0.77  CALCIUM 8.5*  --   --  8.7*   Liver Function Tests Recent Labs    10/27/17 1556  AST 15  ALT 15  ALKPHOS 86  BILITOT 0.6  PROT 4.8*  ALBUMIN 3.2*   Recent Labs    10/27/17 1556  LIPASE 23   Cardiac Enzymes Recent Labs    10/27/17 1829 10/28/17 0003 10/28/17 0614  TROPONINI 0.27* 1.41* 2.92*   Fasting Lipid Panel Recent Labs    10/28/17 0003  CHOL 137  HDL 37*  LDLCALC 71  TRIG 811  CHOLHDL 3.7   _____________  Dg Chest Port 1 View  Result Date: 10/27/2017 CLINICAL DATA:  Chest pain EXAM: PORTABLE CHEST 1 VIEW COMPARISON:  December 31, 2015 FINDINGS: The heart size and mediastinal contours are within normal limits. Both lungs are clear. The visualized skeletal structures are unremarkable. IMPRESSION: No active disease. Electronically Signed   By: Gerome Sam III M.D   On: 10/27/2017 16:04   Disposition   Pt is leaving the hospital Against Medical Advice  Follow-up Plans & Appointments    Follow-up Information    Lennette Bihari, MD Follow up in 1 week(s).   Specialty:   Cardiology Why:  Office will call with appointment with Dr. Tresa Endo or PA/NP on his care team. Contact information: [address] [place] [address] [phone]          Discharge Instructions    Amb Referral to Cardiac Rehabilitation   Complete by:  As directed    Diagnosis:   STEMI Coronary Stents  Discharge Medications   Allergies as of 10/28/2017   No Known Allergies     Medication List    STOP taking these medications   isosorbide mononitrate 60 MG 24 hr tablet Commonly known as:  IMDUR   lisinopril 2.5 MG tablet Commonly known as:  PRINIVIL,ZESTRIL     TAKE these medications   aspirin 81 MG EC tablet Take 1 tablet (81 mg total) by mouth daily.   atorvastatin 80 MG tablet Commonly known as:  LIPITOR Take 1 tablet (80 mg total) by mouth every evening.   clopidogrel 75 MG tablet Commonly known as:  PLAVIX Take 1 tablet (75 mg total) by mouth daily.   nitroGLYCERIN 0.4 MG SL tablet Commonly known as:  NITROSTAT Place 1 tablet (0.4 mg total) under the tongue every 5 (five) minutes x 3 doses as needed for chest pain. What changed:    when to take this  additional instructions        Acute coronary syndrome (MI, NSTEMI, STEMI, etc) this admission?: Yes.     AHA/ACC Clinical Performance & Quality Measures: 5. Aspirin prescribed? - Yes 6. ADP Receptor Inhibitor (Plavix/Clopidogrel, Brilinta/Ticagrelor or Effient/Prasugrel) prescribed (includes medically managed patients)? - Yes 7. Beta Blocker prescribed? - No - Cocaine abuse 8. High Intensity Statin (Lipitor 40-80mg  or Crestor 20-40mg ) prescribed? - Yes 9. EF assessed during THIS hospitalization? - Yes 10. For EF <40%, was ACEI/ARB prescribed? - Not Applicable (EF >/= 40%) 11. For EF <40%, Aldosterone Antagonist (Spironolactone or Eplerenone) prescribed? - Not Applicable (EF >/= 40%) 12. Cardiac Rehab Phase II ordered (Included Medically managed Patients)? - Yes      Outstanding Labs/Studies   1. Arrange follow up Lipids and LFTs at follow up visit.   Duration of Discharge Encounter   Greater than 30 minutes including physician time.  Signed, Tereso Newcomer Tereso Newcomer, PA-C 10/28/2017, 3:10 PM

## 2017-10-28 NOTE — Progress Notes (Addendum)
CARDIAC REHAB PHASE I   PRE:  Rate/Rhythm:67 SR  BP:   Sitting: 148/86     SaO2: 99% RA  MODE:  Ambulation: 470 ft   POST:  Rate/Rhythm: 93 SR BP:   Sitting: 149/102  30 minutes later 138/86     SaO2: 99% RA 11:11-12:08  Pt ambulated 46670ft with SBA. Pt maintained a steady gait and pace. Returned pt to recliner and DBP was elevated 149/102 Education completed with pt in which we discussed HH diet, MI book, risk factors, lifting and activity restrictions. Importance of antiplatelets and medication compliance. Discussed smoking cessation in which pt declined, exercise guidelines, temperature and emergency precautions. Recheck BP after education and deep breathing technique 138/86. [facility]. Prosper Paff D Karely Hurtado,MS,ACSM-RCEP 10/28/2017 12:08 PM   5RA

## 2017-10-28 NOTE — Plan of Care (Signed)
  Problem: Education: Goal: Knowledge of General Education information will improve Outcome: Progressing   Problem: Health Behavior/Discharge Planning: Goal: Ability to manage health-related needs will improve Outcome: Progressing   Problem: Clinical Measurements: Goal: Ability to maintain clinical measurements within normal limits will improve Outcome: Progressing Goal: Will remain free from infection Outcome: Progressing Goal: Diagnostic test results will improve Outcome: Progressing Goal: Respiratory complications will improve Outcome: Progressing Goal: Cardiovascular complication will be avoided Outcome: Progressing   Problem: Activity: Goal: Risk for activity intolerance will decrease Outcome: Progressing   Problem: Nutrition: Goal: Adequate nutrition will be maintained Outcome: Progressing   Problem: Coping: Goal: Level of anxiety will decrease Outcome: Progressing   Problem: Elimination: Goal: Will not experience complications related to bowel motility Outcome: Progressing Goal: Will not experience complications related to urinary retention Outcome: Progressing   Problem: Pain Managment: Goal: General experience of comfort will improve Outcome: Progressing   Problem: Safety: Goal: Ability to remain free from injury will improve Outcome: Progressing   Problem: Skin Integrity: Goal: Risk for impaired skin integrity will decrease Outcome: Progressing   Problem: Education: Goal: Understanding of cardiac disease, CV risk reduction, and recovery process will improve Outcome: Progressing Goal: Understanding of medication regimen will improve Outcome: Progressing   Problem: Activity: Goal: Ability to tolerate increased activity will improve Outcome: Progressing   Problem: Cardiac: Goal: Ability to achieve and maintain adequate cardiopulmonary perfusion will improve Outcome: Progressing

## 2017-10-28 NOTE — Progress Notes (Signed)
Progress Note  Patient Name: Natasha Roach Date of Encounter: 10/28/2017  Primary Cardiologist: Yvonne Kendall, MD   Subjective   Being educated by cardiac rehab.  Very attentive.  Denies chest pain.  Did use cocaine within the 24 hours preceding acute coronary syndrome.  Inpatient Medications    Scheduled Meds: . aspirin EC  81 mg Oral Daily  . atorvastatin  80 mg Oral QPM  . enoxaparin (LOVENOX) injection  40 mg Subcutaneous Q24H  . sodium chloride flush  3 mL Intravenous Q12H  . ticagrelor  90 mg Oral BID   Continuous Infusions: . sodium chloride Stopped (10/28/17 0900)   PRN Meds: sodium chloride, acetaminophen, nitroGLYCERIN, ondansetron (ZOFRAN) IV, sodium chloride flush   Vital Signs    Vitals:   10/28/17 0759 10/28/17 0800 10/28/17 0900 10/28/17 1000  BP:  131/80 (!) 116/97 (!) 115/44  Pulse:  69 60 68  Resp:  18 17 (!) 22  Temp: 98 F (36.7 C)     TempSrc: Axillary     SpO2:  97% 97% 98%  Weight:      Height:        Intake/Output Summary (Last 24 hours) at 10/28/2017 1315 Last data filed at 10/28/2017 1100 Gross per 24 hour  Intake 1246.24 ml  Output 2100 ml  Net -853.76 ml   Filed Weights   10/27/17 1529 10/28/17 0500  Weight: 180 lb (81.6 kg) 180 lb 12.4 oz (82 kg)    Telemetry    Normal sinus rhythm without ventricular ectopy.- Personally Reviewed  ECG    Sinus bradycardia with incomplete right bundle and prominent voltage.- Personally Reviewed  Physical Exam  African-American female appearing slightly older than her stated age. GEN: No acute distress.   Neck: No JVD Cardiac: RRR, no murmurs, rubs, or gallops.  Respiratory: Clear to auscultation bilaterally. GI: Soft, nontender, non-distended  MS: No edema; No deformity. Neuro:  Nonfocal  Psych: Normal affect   Labs    Chemistry Recent Labs  Lab 10/27/17 1556 10/27/17 1648 10/27/17 1829 10/28/17 0614  NA 142 143  --  141  K 4.0 3.6  --  4.0  CL 112* 109  --  111    CO2 24  --   --  23  GLUCOSE 165* 141*  --  96  BUN 8 8  --  6  CREATININE 0.83 0.60 0.82 0.77  CALCIUM 8.5*  --   --  8.7*  PROT 4.8*  --   --   --   ALBUMIN 3.2*  --   --   --   AST 15  --   --   --   ALT 15  --   --   --   ALKPHOS 86  --   --   --   BILITOT 0.6  --   --   --   GFRNONAA >60  --  >60 >60  GFRAA >60  --  >60 >60  ANIONGAP 6  --   --  7     Hematology Recent Labs  Lab 10/27/17 1556 10/27/17 1648 10/27/17 1829 10/28/17 0614  WBC 16.7*  --  17.6* 17.1*  RBC 5.47*  --  5.52* 5.14*  HGB 14.9 14.3 15.0 14.1  HCT 48.0* 42.0 48.6* 44.5  MCV 87.8  --  88.0 86.6  MCH 27.2  --  27.2 27.4  MCHC 31.0  --  30.9 31.7  RDW 12.5  --  12.6 12.6  PLT 188  --  198 179    Cardiac Enzymes Recent Labs  Lab 10/27/17 1829 10/28/17 0003 10/28/17 0614  TROPONINI 0.27* 1.41* 2.92*    Recent Labs  Lab 10/27/17 1559  TROPIPOC 0.00     BNPNo results for input(s): BNP, PROBNP in the last 168 hours.   DDimer No results for input(s): DDIMER in the last 168 hours.   Radiology    Dg Chest Port 1 View  Result Date: 10/27/2017 CLINICAL DATA:  Chest pain EXAM: PORTABLE CHEST 1 VIEW COMPARISON:  December 31, 2015 FINDINGS: The heart size and mediastinal contours are within normal limits. Both lungs are clear. The visualized skeletal structures are unremarkable. IMPRESSION: No active disease. Electronically Signed   By: Gerome Samavid  Williams III M.D   On: 10/27/2017 16:04    Cardiac Studies   Cardiac cath 10/27/2017:  Coronary Diagrams   Diagnostic Diagram       Post-Intervention Diagram        Doppler echocardiogram 10/28/2017: Study Conclusions  - Left ventricle: The cavity size was normal. Wall thickness was   increased in a pattern of mild LVH. Systolic function was normal.   The estimated ejection fraction was in the range of 60% to 65%.   Wall motion was normal; there were no regional wall motion   abnormalities. Indeterminate diastolic function.  Patient  Profile     57 y.o. 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     Assessment & Plan    1. Non-ST elevation acute coronary syndrome temporally related to cocaine use.  Prior history of LAD acute coronary syndrome and similar circumstance.  Currently pain-free.  No evidence of heart failure and no arrhythmias. 2. Aggressive risk factor modification: Smoking sensation, LDL less than 70, cocaine abstinence, blood pressure less than 130/80 mmHg, aerobic activity, and A1c less than 7 all discussed.  Plan ambulation.  Transfer to telemetry.  Education.  For questions or updates, please contact [facility] Please consult [email or web address] for contact info under Cardiology/STEMI.      Signed, Lesleigh NoeHenry W Dorothea Yow III, MD  10/28/2017, 1:15 PM

## 2017-10-30 ENCOUNTER — Telehealth: Payer: Self-pay | Admitting: *Deleted

## 2017-10-30 ENCOUNTER — Encounter (HOSPITAL_COMMUNITY): Payer: Self-pay | Admitting: Cardiology

## 2017-10-30 MED FILL — Heparin Sod (Porcine)-NaCl IV Soln 1000 Unit/500ML-0.9%: INTRAVENOUS | Qty: 1000 | Status: AC

## 2017-10-30 MED FILL — Nitroglycerin IV Soln 100 MCG/ML in D5W: INTRA_ARTERIAL | Qty: 10 | Status: AC

## 2017-10-30 MED FILL — Verapamil HCl IV Soln 2.5 MG/ML: INTRAVENOUS | Qty: 2 | Status: AC

## 2017-10-30 NOTE — Telephone Encounter (Signed)
Left message for patient to call and schedule 1 week TCM appt with Dr.Kelly or APP

## 2017-10-31 ENCOUNTER — Telehealth: Payer: Self-pay | Admitting: Cardiovascular Disease

## 2017-10-31 NOTE — Telephone Encounter (Signed)
-----   Message from Beatrice LecherScott T Weaver, [place]-C sent at 10/28/2017  3:10 PM EDT ----- Regarding: needs TCM follow up 1 week Patient:  Natasha Roach [id number]  Primary Cardiologist:  Dr. Nicki Guadalajarahomas Kelly  Left AMA from hospital on 10/28/2017  Please arrange FU in 1 week with Dr. Tresa EndoKelly or care team. This is a TCM appointment. Signed,  Tereso NewcomerScott Weaver, PA-C   10/28/2017 3:11 PM

## 2017-10-31 NOTE — Telephone Encounter (Signed)
Patient contacted regarding discharge from [place] on 10/28/17.  Patient understands to follow up with provider Joni ReiningKathryn Lawrence, DNP on 11/06/17 at 11am [facility]. Patient understands discharge instructions? yes Patient understands medications and regiment? yes Patient understands to bring all medications to this visit? yes

## 2017-10-31 NOTE — Telephone Encounter (Signed)
Left message to call back  

## 2017-11-03 ENCOUNTER — Telehealth (HOSPITAL_COMMUNITY): Payer: Self-pay

## 2017-11-03 NOTE — Telephone Encounter (Signed)
Attempted to contact patient in regards to Cardiac Rehab Program - Lm on vm

## 2017-11-04 NOTE — Progress Notes (Signed)
Cardiology Office Note   Date:  11/06/2017   ID:  Natasha Roach, DOB 01-08-1961, MRN [id number]  PCP:  Patient, No Pcp Per  Cardiologist:  [facility]  Chief Complaint  Patient presents with  . Follow-up    has headache nearly everyday since starting plavix back. pt denies chest pains      History of Present Illness: Natasha Roach is a 57 y.o. female who presents for posthospitalization follow-up after admission for chest pain, with inferior ST elevation, code STEMI was called and she was taken emergently to the cardiac Cath Lab.  Cardiac catheterization demonstrated patent stent in the LAD but new 85% thrombotic lesion in the LAD prior to the stent.  The RCA and left circumflex were found to be patent.  The patient underwent emergent PCI with drug-eluting stent to the LAD.  She was found to have normal LV systolic function of 55% to 65%.  The patient was placed on dual antiplatelet therapy with Brilinta and aspirin, and high-dose Lipitor.  The patient left AMA earlier than planned, and was warned about leaving the hospital early.  There was also concerns about her taking Brilinta due to cost.  She was loaded with Plavix 300 mg and given a prescription for Plavix 75 mg daily.  The patient did receive prescriptions prior to leaving.  Other history includes polysubstance abuse with cocaine and tobacco, type 2 diabetes, and medical noncompliance. She comes today without further complaints. She is now medically compliant since hospitalization. She unfortunately has not stopped smoking.   Past Medical History:  Diagnosis Date  . CAD (coronary artery disease) 04/23/2016   a. suspected cocaine induced NSTEMI 12/2015; pt declined cath. b. STEMI 03/2016 due to cocaine, occ mLAD s/p Synergy stent, EF 40% by cath, 55% by echo.  . Cocaine abuse (HCC)   . H/O noncompliance with medical treatment, presenting hazards to health   . HTN (hypertension)   . Hyperlipidemia LDL goal <70   .  Ischemic cardiomyopathy   . 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 Morbid obesity (HCC)   . Prolonged QT interval 04/23/2016  . Substance abuse (HCC)   . Tobacco abuse   . Ventricular tachycardia, sustained (HCC)    a. at time of STEMI 03/2016.    Past Surgical History:  Procedure Laterality Date  . ABDOMINAL HYSTERECTOMY    . BREAST SURGERY     abcess  . CARDIAC CATHETERIZATION  2004  . CARDIAC CATHETERIZATION N/A 04/21/2016   Procedure: Left Heart Cath and Coronary Angiography;  Surgeon: Lennette Bihari, MD;  Location: [facility] INVASIVE CV LAB;  Service: Cardiovascular;  Laterality: N/A;  . CARDIAC CATHETERIZATION N/A 04/21/2016   Procedure: Coronary Stent Intervention;  Surgeon: Lennette Bihari, MD;  Location: [facility] INVASIVE CV LAB;  Service: Cardiovascular;  Laterality: N/A;  . CORONARY STENT INTERVENTION N/A 10/27/2017   Procedure: CORONARY STENT INTERVENTION;  Surgeon: Swaziland, Peter M, MD;  Location: [facility] INVASIVE CV LAB;  Service: Cardiovascular;  Laterality: N/A;  . CORONARY/GRAFT ACUTE MI REVASCULARIZATION N/A 10/27/2017   Procedure: Coronary/Graft Acute MI Revascularization;  Surgeon: Swaziland, Peter M, MD;  Location: [facility] INVASIVE CV LAB;  Service: Cardiovascular;  Laterality: N/A;  . ELECTROPHYSIOLOGIC STUDY N/A 04/21/2016   Procedure: Cardioversion;  Surgeon: Lennette Bihari, MD;  Location: [facility] INVASIVE CV LAB;  Service: Cardiovascular;  Laterality: N/A;  . LEFT HEART CATH AND CORONARY ANGIOGRAPHY N/A 10/27/2017   Procedure: LEFT HEART CATH AND CORONARY ANGIOGRAPHY;  Surgeon:  Swaziland, Peter M, MD;  Location: [facility] INVASIVE CV LAB;  Service: Cardiovascular;  Laterality: N/A;     Current Outpatient Medications  Medication Sig Dispense Refill  . aspirin 81 MG EC tablet Take 1 tablet (81 mg total) by mouth daily. 90 tablet 3  . atorvastatin (LIPITOR) 80 MG tablet Take 1 tablet (80 mg total) by mouth every evening. 30 tablet 11  . clopidogrel (PLAVIX) 75 MG tablet Take 1 tablet  (75 mg total) by mouth daily. 30 tablet 11  . nitroGLYCERIN (NITROSTAT) 0.4 MG SL tablet Place 1 tablet (0.4 mg total) under the tongue every 5 (five) minutes x 3 doses as needed for chest pain. 25 tablet 11   No current facility-administered medications for this visit.     Allergies:   Patient has no known allergies.    Social History:  The patient  reports that she has been smoking cigarettes.  She has a 40.00 pack-year smoking history. She has never used smokeless tobacco. She reports that she has current or past drug history. Drug: Cocaine. She reports that she does not drink alcohol.   Family History:  The patient's family history includes CAD in her father.    ROS: All other systems are reviewed and negative. Unless otherwise mentioned in H&P    PHYSICAL EXAM: VS:  BP 130/72 (BP Location: Left Arm, Patient Position: Sitting)   Pulse 71   Ht 5\' 7"  (1.702 m)   Wt 177 lb 12.8 oz (80.6 kg)   BMI 27.85 kg/m  , BMI Body mass index is 27.85 kg/m. GEN: Well nourished, well developed, in no acute distress  HEENT: normal  Neck: no JVD, carotid bruits, or masses Cardiac: RRR; no murmurs, rubs, or gallops,no edema  Respiratory:  clear to auscultation bilaterally, normal work of breathing GI: soft, nontender, nondistended, + BS MS: no deformity or atrophy  Skin: warm and dry, no rash Neuro:  Strength and sensation are intact Psych: euthymic mood, full affect   EKG: NSR with minimal LVH.  Recent Labs: 10/27/2017: ALT 15 10/28/2017: BUN 6; Creatinine, Ser 0.77; Hemoglobin 14.1; Platelets 179; Potassium 4.0; Sodium 141    Lipid Panel    Component Value Date/Time   CHOL 137 10/28/2017 0003   TRIG 143 10/28/2017 0003   HDL 37 (L) 10/28/2017 0003   CHOLHDL 3.7 10/28/2017 0003   VLDL 29 10/28/2017 0003   LDLCALC 71 10/28/2017 0003      Wt Readings from Last 3 Encounters:  11/06/17 177 lb 12.8 oz (80.6 kg)  10/28/17 180 lb 12.4 oz (82 kg)  09/16/16 185 lb 3.2 oz (84 kg)       Other studies Reviewed: Cardiac Cath  Prox LAD-2 lesion is 15% stenosed.  Prox LAD-1 lesion is 85% stenosed.  Post intervention, there is a 0% residual stenosis.  A drug-eluting stent was successfully placed using a STENT SYNERGY DES 4X12.  The left ventricular systolic function is normal.  LV end diastolic pressure is normal.  The left ventricular ejection fraction is 55-65% by visual estimate.   1. Single vessel obstructive CAD. Thrombotic lesion in the LAD just proximal to prior stent. 2. Good LV function 3. Normal LVEDP 4. Successful PCI of the LAD with DES x 1.  Recommend uninterrupted dual antiplatelet therapy with Aspirin 81mg  daily and Ticagrelor 90mg  twice daily for a minimum of 12 months (ACS - Class I recommendation).   Need to focus on risk factor modification including cessation of tobacco and cocaine use.  ASSESSMENT AND PLAN:  1. CAD: S/P hospitalization of acute STEMI of the proximal LAD. This required DES to proximal LAD just prior to previously placed stent. She is now medically compliant with DAPT using Plavix and ASA. She will continue for minimum of one year.   2. Ongoing tobacco abuse: She finds it very difficult to stop smoking. I have discussed options for cessation to include Wellburin and Nicotine patches. She will follow up with PCP for treatment if she chooses to try these in an effort to stop smoking. I have shown her the illustrations of her cath to reinforce the need to stop smoking. Discussion > 3 minutes   3. Hypercholesterolemia: Continue statin therapy.    Current medicines are reviewed at length with the patient today.    Labs/ tests ordered today include: None  Bettey MareKathryn M. Liborio NixonLawrence DNP, ANP, AACC   11/06/2017 12:12 PM    [facility] [address], [place], [place] [address] Phone: [phone]; Fax: [phone]

## 2017-11-06 ENCOUNTER — Encounter: Payer: Self-pay | Admitting: Adult Health

## 2017-11-06 ENCOUNTER — Ambulatory Visit (INDEPENDENT_AMBULATORY_CARE_PROVIDER_SITE_OTHER): Payer: Self-pay | Admitting: Adult Health

## 2017-11-06 VITALS — BP 130/72 | HR 71 | Ht 67.0 in | Wt 177.8 lb

## 2017-11-06 DIAGNOSIS — I2510 Atherosclerotic heart disease of native coronary artery without angina pectoris: Secondary | ICD-10-CM

## 2017-11-06 DIAGNOSIS — Z720 Tobacco use: Secondary | ICD-10-CM

## 2017-11-06 DIAGNOSIS — I10 Essential (primary) hypertension: Secondary | ICD-10-CM

## 2017-11-06 DIAGNOSIS — I2102 ST elevation (STEMI) myocardial infarction involving left anterior descending coronary artery: Secondary | ICD-10-CM

## 2017-11-06 DIAGNOSIS — E7800 Pure hypercholesterolemia, unspecified: Secondary | ICD-10-CM

## 2017-11-06 NOTE — Patient Instructions (Signed)
Medication Instructions:  NO CHANGES- Your physician recommends that you continue on your current medications as directed. Please refer to the Current Medication list given to you today.  If you need a refill on your cardiac medications before your next appointment, please call your pharmacy.  Follow-Up: Your physician wants you to follow-up in: 3 MONTHS WITH DR KELLY.     Thank you for choosing [facility]!!      

## 2017-11-13 ENCOUNTER — Telehealth: Payer: Self-pay | Admitting: Cardiovascular Disease

## 2017-11-13 NOTE — Telephone Encounter (Signed)
Left message for pt to call back  °

## 2017-11-13 NOTE — Telephone Encounter (Signed)
New Message:    Pt wants to know if she is supposed to be taking Clopidogrel and Lisinopril?

## 2017-11-14 NOTE — Telephone Encounter (Signed)
Follow up ° °Pt returning call for nurse °

## 2017-11-14 NOTE — Telephone Encounter (Signed)
Attempted to contact patient to discuss medications. No answer, LVM to call back, given call back number.

## 2017-11-14 NOTE — Telephone Encounter (Signed)
Called patient, discussed the information given at discharge from hospital, and the information from last follow up appointment.    She will need to remain on ASA 81, Plavix 75, Lipitor 80.  If her BP is high enough at follow up, she can resume Lisinopril.  At last follow up appointment BP was 130/72, she was not restarted on Lisinopril. Explained to patient to start taking BP at home, and if it begins to increase to call us back to let us know.   Patient verbalized understanding.

## 2018-01-16 ENCOUNTER — Inpatient Hospital Stay (HOSPITAL_COMMUNITY): Payer: Self-pay

## 2018-01-16 ENCOUNTER — Encounter (HOSPITAL_COMMUNITY): Payer: Self-pay | Admitting: Cardiology

## 2018-01-16 ENCOUNTER — Inpatient Hospital Stay (HOSPITAL_COMMUNITY)
Admission: EM | Admit: 2018-01-16 | Discharge: 2018-01-17 | DRG: 251 | Disposition: A | Discharge status: Home / Self Care | Payer: Self-pay | Source: Other Acute Inpatient Hospital | Attending: Cardiology | Admitting: Cardiology

## 2018-01-16 ENCOUNTER — Inpatient Hospital Stay (HOSPITAL_COMMUNITY)
Admission: EM | Disposition: A | Discharge status: Home / Self Care | Payer: Self-pay | Source: Other Acute Inpatient Hospital | Attending: Cardiology

## 2018-01-16 ENCOUNTER — Other Ambulatory Visit: Payer: Self-pay

## 2018-01-16 DIAGNOSIS — Z7902 Long term (current) use of antithrombotics/antiplatelets: Secondary | ICD-10-CM

## 2018-01-16 DIAGNOSIS — I700 Atherosclerosis of aorta: Secondary | ICD-10-CM | POA: Diagnosis present

## 2018-01-16 DIAGNOSIS — F17210 Nicotine dependence, cigarettes, uncomplicated: Secondary | ICD-10-CM | POA: Diagnosis present

## 2018-01-16 DIAGNOSIS — E785 Hyperlipidemia, unspecified: Secondary | ICD-10-CM | POA: Diagnosis present

## 2018-01-16 DIAGNOSIS — I2102 ST elevation (STEMI) myocardial infarction involving left anterior descending coronary artery: Principal | ICD-10-CM

## 2018-01-16 DIAGNOSIS — Z720 Tobacco use: Secondary | ICD-10-CM | POA: Diagnosis present

## 2018-01-16 DIAGNOSIS — I255 Ischemic cardiomyopathy: Secondary | ICD-10-CM | POA: Diagnosis present

## 2018-01-16 DIAGNOSIS — Z79899 Other long term (current) drug therapy: Secondary | ICD-10-CM

## 2018-01-16 DIAGNOSIS — E119 Type 2 diabetes mellitus without complications: Secondary | ICD-10-CM

## 2018-01-16 DIAGNOSIS — Z9861 Coronary angioplasty status: Secondary | ICD-10-CM

## 2018-01-16 DIAGNOSIS — I252 Old myocardial infarction: Secondary | ICD-10-CM

## 2018-01-16 DIAGNOSIS — I10 Essential (primary) hypertension: Secondary | ICD-10-CM | POA: Diagnosis present

## 2018-01-16 DIAGNOSIS — F1410 Cocaine abuse, uncomplicated: Secondary | ICD-10-CM | POA: Diagnosis present

## 2018-01-16 DIAGNOSIS — Z7982 Long term (current) use of aspirin: Secondary | ICD-10-CM

## 2018-01-16 DIAGNOSIS — Z955 Presence of coronary angioplasty implant and graft: Secondary | ICD-10-CM

## 2018-01-16 DIAGNOSIS — Z8249 Family history of ischemic heart disease and other diseases of the circulatory system: Secondary | ICD-10-CM

## 2018-01-16 DIAGNOSIS — I2109 ST elevation (STEMI) myocardial infarction involving other coronary artery of anterior wall: Secondary | ICD-10-CM

## 2018-01-16 DIAGNOSIS — Z9114 Patient's other noncompliance with medication regimen: Secondary | ICD-10-CM

## 2018-01-16 DIAGNOSIS — I2510 Atherosclerotic heart disease of native coronary artery without angina pectoris: Secondary | ICD-10-CM

## 2018-01-16 DIAGNOSIS — I213 ST elevation (STEMI) myocardial infarction of unspecified site: Secondary | ICD-10-CM

## 2018-01-16 DIAGNOSIS — Z9119 Patient's noncompliance with other medical treatment and regimen: Secondary | ICD-10-CM

## 2018-01-16 HISTORY — PX: LEFT HEART CATH AND CORONARY ANGIOGRAPHY: CATH118249

## 2018-01-16 HISTORY — PX: CORONARY/GRAFT ACUTE MI REVASCULARIZATION: CATH118305

## 2018-01-16 LAB — POCT I-STAT, CHEM 8
BUN: 11 mg/dL (ref 6–20)
CALCIUM ION: 1.2 mmol/L (ref 1.15–1.40)
Chloride: 107 mmol/L (ref 98–111)
Creatinine, Ser: 0.6 mg/dL (ref 0.44–1.00)
Glucose, Bld: 208 mg/dL — ABNORMAL HIGH (ref 70–99)
HEMATOCRIT: 45 % (ref 36.0–46.0)
HEMOGLOBIN: 15.3 g/dL — AB (ref 12.0–15.0)
Potassium: 4 mmol/L (ref 3.5–5.1)
SODIUM: 139 mmol/L (ref 135–145)
TCO2: 20 mmol/L — AB (ref 22–32)

## 2018-01-16 LAB — PROTIME-INR
INR: 0.95
Prothrombin Time: 12.6 seconds (ref 11.4–15.2)

## 2018-01-16 LAB — POCT I-STAT 3, ART BLOOD GAS (G3+)
ACID-BASE DEFICIT: 4 mmol/L — AB (ref 0.0–2.0)
Bicarbonate: 18.9 mmol/L — ABNORMAL LOW (ref 20.0–28.0)
O2 Saturation: 96 %
PO2 ART: 76 mmHg — AB (ref 83.0–108.0)
TCO2: 20 mmol/L — ABNORMAL LOW (ref 22–32)
pCO2 arterial: 27.7 mmHg — ABNORMAL LOW (ref 32.0–48.0)
pH, Arterial: 7.442 (ref 7.350–7.450)

## 2018-01-16 LAB — ECHOCARDIOGRAM COMPLETE
Height: 67 in
Weight: 2800 oz

## 2018-01-16 LAB — CBC WITH DIFFERENTIAL/PLATELET
ABS IMMATURE GRANULOCYTES: 0.1 10*3/uL (ref 0.0–0.1)
BASOS ABS: 0.1 10*3/uL (ref 0.0–0.1)
BASOS PCT: 0 %
Eosinophils Absolute: 0.1 10*3/uL (ref 0.0–0.7)
Eosinophils Relative: 1 %
HCT: 48.3 % — ABNORMAL HIGH (ref 36.0–46.0)
HEMOGLOBIN: 15.2 g/dL — AB (ref 12.0–15.0)
Immature Granulocytes: 1 %
LYMPHS PCT: 24 %
Lymphs Abs: 4.4 10*3/uL — ABNORMAL HIGH (ref 0.7–4.0)
MCH: 27.1 pg (ref 26.0–34.0)
MCHC: 31.5 g/dL (ref 30.0–36.0)
MCV: 86.1 fL (ref 78.0–100.0)
MONO ABS: 0.7 10*3/uL (ref 0.1–1.0)
Monocytes Relative: 4 %
NEUTROS ABS: 12.8 10*3/uL — AB (ref 1.7–7.7)
NEUTROS PCT: 70 %
Platelets: 236 10*3/uL (ref 150–400)
RBC: 5.61 MIL/uL — AB (ref 3.87–5.11)
RDW: 12.7 % (ref 11.5–15.5)
WBC: 18.1 10*3/uL — AB (ref 4.0–10.5)

## 2018-01-16 LAB — COMPREHENSIVE METABOLIC PANEL
ALK PHOS: 74 U/L (ref 38–126)
ALT: 18 U/L (ref 0–44)
ANION GAP: 10 (ref 5–15)
AST: 16 U/L (ref 15–41)
Albumin: 3.1 g/dL — ABNORMAL LOW (ref 3.5–5.0)
BUN: 11 mg/dL (ref 6–20)
CHLORIDE: 108 mmol/L (ref 98–111)
CO2: 19 mmol/L — AB (ref 22–32)
CREATININE: 0.78 mg/dL (ref 0.44–1.00)
Calcium: 8.5 mg/dL — ABNORMAL LOW (ref 8.9–10.3)
GFR calc non Af Amer: >60 mL/min (ref >60)
Glucose, Bld: 203 mg/dL — ABNORMAL HIGH (ref 70–99)
Potassium: 4.3 mmol/L (ref 3.5–5.1)
SODIUM: 137 mmol/L (ref 135–145)
Total Bilirubin: 0.7 mg/dL (ref 0.3–1.2)
Total Protein: 5.1 g/dL — ABNORMAL LOW (ref 6.5–8.1)

## 2018-01-16 LAB — POCT ACTIVATED CLOTTING TIME
Activated Clotting Time: 428 seconds
Activated Clotting Time: 114 seconds

## 2018-01-16 LAB — HEMOGLOBIN A1C
HEMOGLOBIN A1C: 6.9 % — AB (ref 4.8–5.6)
Mean Plasma Glucose: 151.33 mg/dL

## 2018-01-16 LAB — POCT I-STAT TROPONIN I: TROPONIN I, POC: 0 ng/mL (ref 0.00–0.08)

## 2018-01-16 LAB — SURGICAL PCR SCREEN
MRSA, PCR: NEGATIVE
Staphylococcus aureus: NEGATIVE

## 2018-01-16 LAB — TROPONIN I: TROPONIN I: 1.42 ng/mL — AB (ref <0.03)

## 2018-01-16 SURGERY — LEFT HEART CATH AND CORONARY ANGIOGRAPHY
Anesthesia: LOCAL

## 2018-01-16 MED ORDER — HYDRALAZINE HCL 20 MG/ML IJ SOLN
10.0000 mg | Freq: Once | INTRAMUSCULAR | Status: AC
  Administered 2018-01-16: 10 mg via INTRAVENOUS
  Filled 2018-01-16: qty 1

## 2018-01-16 MED ORDER — NITROGLYCERIN 1 MG/10 ML FOR IR/CATH LAB
INTRA_ARTERIAL | Status: DC | PRN
  Administered 2018-01-16: 200 ug via INTRACORONARY

## 2018-01-16 MED ORDER — LIDOCAINE HCL (PF) 1 % IJ SOLN
INTRAMUSCULAR | Status: AC
  Filled 2018-01-16: qty 30

## 2018-01-16 MED ORDER — ASPIRIN EC 81 MG PO TBEC
81.0000 mg | DELAYED_RELEASE_TABLET | Freq: Every day | ORAL | Status: DC
  Administered 2018-01-17: 81 mg via ORAL
  Filled 2018-01-16: qty 1

## 2018-01-16 MED ORDER — HEPARIN (PORCINE) IN NACL 1000-0.9 UT/500ML-% IV SOLN
INTRAVENOUS | Status: DC | PRN
  Administered 2018-01-16 (×2): 500 mL

## 2018-01-16 MED ORDER — ENOXAPARIN SODIUM 40 MG/0.4ML ~~LOC~~ SOLN: 40.0000 mg | SUBCUTANEOUS | Status: DC

## 2018-01-16 MED ORDER — ATROPINE SULFATE 1 MG/10ML IJ SOSY
PREFILLED_SYRINGE | INTRAMUSCULAR | Status: AC
  Filled 2018-01-16: qty 10

## 2018-01-16 MED ORDER — CLOPIDOGREL BISULFATE 300 MG PO TABS
ORAL_TABLET | ORAL | Status: DC | PRN
  Administered 2018-01-16: 600 mg via ORAL

## 2018-01-16 MED ORDER — SODIUM CHLORIDE 0.9 % WEIGHT BASED INFUSION: 1.0000 mL/kg/h | INTRAVENOUS | Status: AC

## 2018-01-16 MED ORDER — HEPARIN SODIUM (PORCINE) 1000 UNIT/ML IJ SOLN
INTRAMUSCULAR | Status: DC | PRN
  Administered 2018-01-16: 7000 [IU] via INTRAVENOUS

## 2018-01-16 MED ORDER — SODIUM CHLORIDE 0.9% FLUSH
3.0000 mL | Freq: Two times a day (BID) | INTRAVENOUS | Status: DC
  Administered 2018-01-16 – 2018-01-17 (×2): 3 mL via INTRAVENOUS

## 2018-01-16 MED ORDER — ASPIRIN EC 81 MG PO TBEC: 81.0000 mg | DELAYED_RELEASE_TABLET | Freq: Every day | ORAL | Status: DC

## 2018-01-16 MED ORDER — SODIUM CHLORIDE 0.9 % IV SOLN: 250.0000 mL | INTRAVENOUS | Status: DC | PRN

## 2018-01-16 MED ORDER — ACETAMINOPHEN 325 MG PO TABS: 650.0000 mg | ORAL_TABLET | ORAL | Status: DC | PRN

## 2018-01-16 MED ORDER — ATORVASTATIN CALCIUM 80 MG PO TABS
80.0000 mg | ORAL_TABLET | Freq: Every evening | ORAL | Status: DC
  Administered 2018-01-16: 80 mg via ORAL
  Filled 2018-01-16: qty 1

## 2018-01-16 MED ORDER — CLOPIDOGREL BISULFATE 75 MG PO TABS
75.0000 mg | ORAL_TABLET | Freq: Every day | ORAL | Status: DC
  Administered 2018-01-17: 75 mg via ORAL
  Filled 2018-01-16: qty 1

## 2018-01-16 MED ORDER — NITROGLYCERIN 0.4 MG SL SUBL: 0.4000 mg | SUBLINGUAL_TABLET | SUBLINGUAL | Status: DC | PRN

## 2018-01-16 MED ORDER — TIROFIBAN HCL IV 12.5 MG/250 ML
0.1500 ug/kg/min | INTRAVENOUS | Status: DC
  Administered 2018-01-17: 0.15 ug/kg/min via INTRAVENOUS
  Filled 2018-01-16 (×2): qty 100
  Filled 2018-01-16: qty 250

## 2018-01-16 MED ORDER — SODIUM CHLORIDE 0.9% FLUSH: 3.0000 mL | INTRAVENOUS | Status: DC | PRN

## 2018-01-16 MED ORDER — ENOXAPARIN SODIUM 40 MG/0.4ML ~~LOC~~ SOLN
40.0000 mg | SUBCUTANEOUS | Status: DC
  Administered 2018-01-17: 40 mg via SUBCUTANEOUS
  Filled 2018-01-16: qty 0.4

## 2018-01-16 MED ORDER — TIROFIBAN HCL IV 12.5 MG/250 ML
INTRAVENOUS | Status: DC | PRN
  Administered 2018-01-16: 0.15 ug/kg/min via INTRAVENOUS

## 2018-01-16 MED ORDER — TIROFIBAN (AGGRASTAT) BOLUS VIA INFUSION
INTRAVENOUS | Status: DC | PRN
  Administered 2018-01-16: 1985 ug via INTRAVENOUS

## 2018-01-16 MED ORDER — TIROFIBAN HCL IV 12.5 MG/250 ML
INTRAVENOUS | Status: AC
  Filled 2018-01-16: qty 250

## 2018-01-16 MED ORDER — CLOPIDOGREL BISULFATE 300 MG PO TABS
ORAL_TABLET | ORAL | Status: AC
  Filled 2018-01-16: qty 2

## 2018-01-16 MED ORDER — HEPARIN (PORCINE) IN NACL 1000-0.9 UT/500ML-% IV SOLN
INTRAVENOUS | Status: AC
  Filled 2018-01-16: qty 1000

## 2018-01-16 SURGICAL SUPPLY — 19 items
BALLN SAPPHIRE 3.0X12 (BALLOONS) ×2
BALLN SAPPHIRE 3.5X10 (BALLOONS) ×2
BALLOON SAPPHIRE 3.0X12 (BALLOONS) IMPLANT
BALLOON SAPPHIRE 3.5X10 (BALLOONS) IMPLANT
CATH INFINITI 5FR ANG PIGTAIL (CATHETERS) ×2 IMPLANT
CATH INFINITI JR4 5F (CATHETERS) ×1 IMPLANT
CATH VISTA GUIDE 6FR XBLAD3.5 (CATHETERS) ×1 IMPLANT
GLIDESHEATH SLEND SS 6F .021 (SHEATH) ×1 IMPLANT
GUIDEWIRE INQWIRE 1.5J.035X260 (WIRE) IMPLANT
INQWIRE 1.5J .035X260CM (WIRE) ×2
KIT ENCORE 26 ADVANTAGE (KITS) ×1 IMPLANT
KIT HEART LEFT (KITS) ×2 IMPLANT
PACK CARDIAC CATHETERIZATION (CUSTOM PROCEDURE TRAY) ×2 IMPLANT
SHEATH PINNACLE 6F 10CM (SHEATH) ×1 IMPLANT
SYR MEDRAD MARK V 150ML (SYRINGE) ×2 IMPLANT
TRANSDUCER W/STOPCOCK (MISCELLANEOUS) ×2 IMPLANT
TUBING CIL FLEX 10 FLL-RA (TUBING) ×2 IMPLANT
WIRE ASAHI PROWATER 180CM (WIRE) ×1 IMPLANT
WIRE EMERALD 3MM-J .035X150CM (WIRE) ×1 IMPLANT

## 2018-01-16 NOTE — ED Provider Notes (Signed)
[facility] CARDIOVASCULAR ICU Provider Note   CSN: [id number] Arrival date & time: 01/16/18  1200     History   Chief Complaint Chief Complaint  Patient presents with  . Code STEMI    HPI Natasha Roach is a 57 y.o. female.  The history is provided by the patient, medical records and the EMS personnel.  Chest Pain   This is a new problem. The problem occurs constantly. The problem has not changed since onset.Associated with: cocaine use. The pain is present in the substernal region. The pain is moderate. The quality of the pain is described as heavy and pressure-like. The pain does not radiate. Associated symptoms include diaphoresis, dizziness, exertional chest pressure and weakness. She has tried nothing for the symptoms. The treatment provided no relief. Risk factors include substance abuse, smoking/tobacco exposure, sedentary lifestyle, obesity and lack of exercise.    Past Medical History:  Diagnosis Date  . CAD (coronary artery disease) 04/23/2016   a. suspected cocaine induced NSTEMI 12/2015; pt declined cath. b. STEMI 03/2016 due to cocaine, occ mLAD s/p Synergy stent, EF 40% by cath, 55% by echo.  . Cocaine abuse (HCC)   . H/O noncompliance with medical treatment, presenting hazards to health   . HTN (hypertension)   . Hyperlipidemia LDL goal <70   . Ischemic cardiomyopathy   . 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 Morbid obesity (HCC)   . Prolonged QT interval 04/23/2016  . Substance abuse (HCC)   . Tobacco abuse   . Ventricular tachycardia, sustained (HCC)    a. at time of STEMI 03/2016.    Patient Active Problem List   Diagnosis Date Noted  . Acute ST elevation myocardial infarction (STEMI) involving left anterior descending (LAD) coronary artery (HCC) 10/27/2017  . Prolonged QT interval 04/23/2016  . CAD (coronary artery disease) 04/23/2016  . H/O noncompliance with medical treatment, presenting hazards to health 04/23/2016   . Hyperlipidemia LDL goal <70   . Ischemic cardiomyopathy   . Acute ST elevation myocardial infarction (STEMI) of anterior wall (HCC) 04/21/2016  . HTN (hypertension) 04/21/2016  . Diabetes type 2, controlled (HCC) 04/21/2016  . Tobacco abuse 04/21/2016  . Ventricular tachycardia, sustained (HCC) 04/21/2016  . Morbid obesity (HCC) 01/01/2016  . Acute coronary syndrome (HCC) 12/31/2015  . Cocaine abuse (HCC) 12/31/2015  . Leukocytosis 12/31/2015    Past Surgical History:  Procedure Laterality Date  . ABDOMINAL HYSTERECTOMY    . BREAST SURGERY     abcess  . CARDIAC CATHETERIZATION  2004  . CARDIAC CATHETERIZATION N/A 04/21/2016   Procedure: Left Heart Cath and Coronary Angiography;  Surgeon: Lennette Biharihomas A Kelly, MD;  [facility];  Service: Cardiovascular;  Laterality: N/A;  . CARDIAC CATHETERIZATION N/A 04/21/2016   Procedure: Coronary Stent Intervention;  Surgeon: Lennette Biharihomas A Kelly, MD;  Location: [facility] INVASIVE CV LAB;  Service: Cardiovascular;  Laterality: N/A;  . CORONARY STENT INTERVENTION N/A 10/27/2017   Procedure: CORONARY STENT INTERVENTION;  Surgeon: SwazilandJordan, Peter M, MD;  [facility];  Service: Cardiovascular;  Laterality: N/A;  . CORONARY/GRAFT ACUTE MI REVASCULARIZATION N/A 10/27/2017   Procedure: Coronary/Graft Acute MI Revascularization;  Surgeon: SwazilandJordan, Peter M, MD;  [facility];  Service: Cardiovascular;  Laterality: N/A;  . CORONARY/GRAFT ACUTE MI REVASCULARIZATION N/A 01/16/2018   Procedure: Coronary/Graft Acute MI Revascularization;  Surgeon: SwazilandJordan, Peter M, MD;  [facility];  Service: Cardiovascular;  Laterality: N/A;  .  ELECTROPHYSIOLOGIC STUDY N/A 04/21/2016   Procedure: Cardioversion;  Surgeon: Lennette Bihari, MD;  Location: [facility] INVASIVE CV LAB;  Service: Cardiovascular;  Laterality: N/A;  . LEFT HEART CATH AND CORONARY ANGIOGRAPHY N/A 10/27/2017   Procedure: LEFT HEART CATH AND CORONARY ANGIOGRAPHY;  Surgeon:  Swaziland, Peter M, MD;  Location: [facility] INVASIVE CV LAB;  Service: Cardiovascular;  Laterality: N/A;  . LEFT HEART CATH AND CORONARY ANGIOGRAPHY N/A 01/16/2018   Procedure: LEFT HEART CATH AND CORONARY ANGIOGRAPHY;  Surgeon: Swaziland, Peter M, MD;  Location: [facility] INVASIVE CV LAB;  Service: Cardiovascular;  Laterality: N/A;     OB History   None      Home Medications    Prior to Admission medications   Medication Sig Start Date End Date Taking? Authorizing Provider  aspirin 81 MG EC tablet Take 1 tablet (81 mg total) by mouth daily. 04/23/16   Dunn, Tacey Ruiz, PA-C  atorvastatin (LIPITOR) 80 MG tablet Take 1 tablet (80 mg total) by mouth every evening. 10/28/17   Tereso Newcomer T, PA-C  clopidogrel (PLAVIX) 75 MG tablet Take 1 tablet (75 mg total) by mouth daily. 10/28/17 10/28/18  Tereso Newcomer T, PA-C  nitroGLYCERIN (NITROSTAT) 0.4 MG SL tablet Place 1 tablet (0.4 mg total) under the tongue every 5 (five) minutes x 3 doses as needed for chest pain. 10/28/17   Beatrice Lecher, PA-C    Family History Family History  Problem Relation Age of Onset  . CAD Father     Social History Social History   Tobacco Use  . Smoking status: Current Every Day Smoker    Packs/day: 1.00    Years: 40.00    Pack years: 40.00    Types: Cigarettes  . Smokeless tobacco: Never Used  Substance Use Topics  . Alcohol use: No  . Drug use: Yes    Types: Cocaine    Comment: last use 04/21/16     Allergies   Patient has no known allergies.   Review of Systems Review of Systems  Constitutional: Positive for diaphoresis.  Cardiovascular: Positive for chest pain.  Neurological: Positive for dizziness and weakness.  All other systems reviewed and are negative.    Physical Exam Updated Vital Signs BP (!) 156/92   Pulse 61   Temp 98 F (36.7 C) (Oral)   Resp (!) 22   Ht 5\' 7"  (1.702 m)   Wt 79.4 kg   SpO2 97%   BMI 27.41 kg/m   Physical Exam  Constitutional: She is oriented to person, place, and time.  She appears well-developed and well-nourished.  HENT:  Head: Normocephalic and atraumatic.  Eyes: Conjunctivae and EOM are normal.  Neck: Normal range of motion.  Cardiovascular: Regular rhythm. Bradycardia present.  Pulmonary/Chest: Effort normal. No stridor. No respiratory distress.  Abdominal: Soft. Bowel sounds are normal. She exhibits no distension.  Musculoskeletal: Normal range of motion. She exhibits no edema or deformity.  Neurological: She is alert and oriented to person, place, and time.  Skin: Skin is warm. She is diaphoretic.  Nursing note and vitals reviewed.    ED Treatments / Results  Labs (all labs ordered are listed, but only abnormal results are displayed) Labs Reviewed  CBC WITH DIFFERENTIAL/PLATELET - Abnormal; Notable for the following components:      Result Value   WBC 18.1 (*)    RBC 5.61 (*)    Hemoglobin 15.2 (*)    HCT 48.3 (*)    Neutro Abs 12.8 (*)    Lymphs  Abs 4.4 (*)    All other components within normal limits  COMPREHENSIVE METABOLIC PANEL - Abnormal; Notable for the following components:   CO2 19 (*)    Glucose, Bld 203 (*)    Calcium 8.5 (*)    Total Protein 5.1 (*)    Albumin 3.1 (*)    All other components within normal limits  HEMOGLOBIN A1C - Abnormal; Notable for the following components:   Hgb A1c MFr Bld 6.9 (*)    All other components within normal limits  POCT I-STAT 3, ART BLOOD GAS (G3+) - Abnormal; Notable for the following components:   pCO2 arterial 27.7 (*)    pO2, Arterial 76.0 (*)    Bicarbonate 18.9 (*)    TCO2 20 (*)    Acid-base deficit 4.0 (*)    All other components within normal limits  POCT I-STAT, CHEM 8 - Abnormal; Notable for the following components:   Glucose, Bld 208 (*)    TCO2 20 (*)    Hemoglobin 15.3 (*)    All other components within normal limits  SURGICAL PCR SCREEN  PROTIME-INR  TROPONIN I  TROPONIN I  I-STAT TROPONIN, ED  POCT I-STAT TROPONIN I  POCT ACTIVATED CLOTTING TIME     EKG EKG Interpretation  Date/Time:  Tuesday January 16 2018 12:05:30 EDT Ventricular Rate:  51 PR Interval:    QRS Duration: 87 QT Interval:  529 QTC Calculation: 488 R Axis:   139 Text Interpretation:  Sinus rhythm Probable RVH w/ secondary repol abnormality Repol abnrm suggests ischemia, diffuse leads new ST depression and TWI's Confirmed by Marily Memos [phone]) on 01/16/2018 3:56:46 PM   Radiology No results found.  Procedures Procedures (including critical care time)  CRITICAL CARE Performed by: Marily Memos Total critical care time: 35 minutes Critical care time was exclusive of separately billable procedures and treating other patients. Critical care was necessary to treat or prevent imminent or life-threatening deterioration. Critical care was time spent personally by me on the following activities: development of treatment plan with patient and/or surrogate as well as nursing, discussions with consultants, evaluation of patient's response to treatment, examination of patient, obtaining history from patient or surrogate, ordering and performing treatments and interventions, ordering and review of laboratory studies, ordering and review of radiographic studies, pulse oximetry and re-evaluation of patient's condition.   Medications Ordered in ED Medications  atorvastatin (LIPITOR) tablet 80 mg (has no administration in time range)  clopidogrel (PLAVIX) tablet 75 mg (has no administration in time range)  nitroGLYCERIN (NITROSTAT) SL tablet 0.4 mg (has no administration in time range)  acetaminophen (TYLENOL) tablet 650 mg (has no administration in time range)  enoxaparin (LOVENOX) injection 40 mg (has no administration in time range)  0.9% sodium chloride infusion (1 mL/kg/hr  79.4 kg Intravenous IV Pump [employer] 01/16/18 1346)  sodium chloride flush (NS) 0.9 % injection 3 mL (has no administration in time range)  sodium chloride flush (NS) 0.9 % injection 3 mL (has  no administration in time range)  0.9 %  sodium chloride infusion (has no administration in time range)  aspirin EC tablet 81 mg (has no administration in time range)  tirofiban (AGGRASTAT) infusion 50 mcg/mL 250 mL (has no administration in time range)    Initial Impression / Assessment and Plan / ED Course  I have reviewed the triage vital signs and the nursing notes.  Pertinent labs & imaging results that were available during my care of the patient were reviewed by me and  considered in my medical decision making (see chart for details).  57 year old with chest pain here with code STEMI called the field evaluation patient's tachypnea, diaphoretic, bradycardic and soft blood pressures.  EKG is consistent with a either lateral or posterior ST elevation MI.  Aspirin already given.  Discussed cardiology will take to Cath Lab. Transported in critical condition.   Final Clinical Impressions(s) / ED Diagnoses   Final diagnoses:  ST elevation myocardial infarction (STEMI), unspecified artery (HCC)     Tahmir Kleckner, Barbara Cower, MD 01/16/18 1559

## 2018-01-16 NOTE — ED Triage Notes (Signed)
PT BIBA for c/o non radiating CP that began 1.5 hr ago when she smoked cocaine earlier today ; upon arrival EMS found pt to be diaphoretic and had ST elevations in 5+6 ; pt received 324 of ASA and 0.4 nitro SL ; pt recently had STEMI in July hr 50 bp 98/74, 2 IV's placed by EMS

## 2018-01-16 NOTE — Progress Notes (Signed)
Pharmacy contacted regarding scheduled dose of Plavix 75mg . Pt received 600mg  Plavix in cath lab this afternoon. Per Pharmacy, no additional plavix today, next dose plavix scheduled for tomorrow.

## 2018-01-16 NOTE — Progress Notes (Signed)
Noticed bleeding at R femoral sheath removal site. Pressure held x 20 min.  Cath lab staff in to visualize site.  Dressing applied. 2+pedal pulse present. VSS.  No c/o pain. Instructions given to pt. To keep head on pillow and hold site if coughing.  R leg sheeted as reminder to not move leg. Husband in room with pt. Pt. Resting quietly.

## 2018-01-16 NOTE — Progress Notes (Signed)
ANTICOAGULATION CONSULT NOTE - Initial Consult  Pharmacy Consult for [place] Indication: chest pain/ACS  No Known Allergies  Patient Measurements: Height: 5\' 7"  (170.2 cm) Weight: 175 lb (79.4 kg) IBW/kg (Calculated) : 61.6  Vital Signs: Temp: 98 F (36.7 C) (09/24 1201) Temp Source: Oral (09/24 1201) BP: 158/82 (09/24 1400) Pulse Rate: 55 (09/24 1415)  Labs: Recent Labs    01/16/18 1200 01/16/18 1233  HGB 15.2* 15.3*  HCT 48.3* 45.0  PLT 236  --   LABPROT 12.6  --   INR 0.95  --   CREATININE 0.78 0.60    Estimated Creatinine Clearance: 85.2 mL/min (by C-G formula based on SCr of 0.6 mg/dL).   Medical History: Past Medical History:  Diagnosis Date  . CAD (coronary artery disease) 04/23/2016   a. suspected cocaine induced NSTEMI 12/2015; pt declined cath. b. STEMI 03/2016 due to cocaine, occ mLAD s/p Synergy stent, EF 40% by cath, 55% by echo.  . Cocaine abuse (HCC)   . H/O noncompliance with medical treatment, presenting hazards to health   . HTN (hypertension)   . Hyperlipidemia LDL goal <70   . Ischemic cardiomyopathy   . 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 Morbid obesity (HCC)   . Prolonged QT interval 04/23/2016  . Substance abuse (HCC)   . Tobacco abuse   . Ventricular tachycardia, sustained (HCC)    a. at time of STEMI 03/2016.    Medications:  Scheduled:  . [START ON 01/17/2018] aspirin EC  81 mg Oral Daily  . atorvastatin  80 mg Oral QPM  . clopidogrel  75 mg Oral Daily  . [START ON 01/17/2018] enoxaparin (LOVENOX) injection  40 mg Subcutaneous Q24H  . sodium chloride flush  3 mL Intravenous Q12H   Infusions:  . sodium chloride    . sodium chloride 1 mL/kg/hr (01/16/18 1344)  . tirofiban      Assessment: Pt taken urgently to cath lab after ST elevation on EKG. Pt with history of stents, most recent in 10/2017. She has been noncompliant on plavix therapy, last dose 3 weeks ago.  Patient started on tirofiban in the  cath lab and is to continue for 18 hours. Tirofiban started at 12:40. CBC okay.    Plan:  Continue tirofiban for 18 hours, until 1845.   Marcelino FreestoneEmily McElhaney, PharmD PGY2 Cardiology Pharmacy Resident Phone [phone] 01/16/2018 2:32 PM

## 2018-01-16 NOTE — Progress Notes (Signed)
I responded to Code STEMI. IThe patient was take to the CATHLAB. I reached out to contact number available but was unsuccessful in talking with someone. I went to the Cath Lab to update the nurse.   Chaplain Dr Melvyn NovasMichael Marlaina Coburn

## 2018-01-16 NOTE — Progress Notes (Addendum)
SBP > 160 via cuff. Currently no PRN medications ordered for hypertension. A1C 6.9, currently no blood glucose monitoring ordered. Cardiology paged. Orders from Dr. Deforest HoylesAkhter, 10mg  Hydralazine IV once for SBP >160, repeat dose one time in 4 hours if SBP > 160. No additional orders for blood glucose monitoring. Assessment ongoing.

## 2018-01-16 NOTE — H&P (Signed)
Cardiology Admission History and Physical:   Patient ID: Natasha Roach MRN: [id number]; DOB: Jun 23, 1960   Admission date: 01/16/2018  Primary Care Provider: Patient, No Pcp Per Primary Cardiologist: Nicki Guadalajara, MD  Chief Complaint:  CP  Patient Profile:   Natasha Roach is a 57 y.o. female with CAD s/p multiple MI, ongoing cocaine abuse, HTN, HLD, Vf arrest, DM, tobacco abuse and non compliance presented with code STEMI.   Hx of coronary artery disease s/p anterior STEMI in 03/2016 treated with PCI of the LAD complicated by VFib arrest with reperfusion. Patient again admitted 10/2017 with anterior STEMI. Cardiac catheterization demonstrated patent stent in the LAD but new 85% thrombotic lesion in the LAD prior to the stent s/p emergent PCI with drug-eluting stent to the LAD. The RCA and left circumflex were found to be patent.  Left AMA on ASA and plavix.   Last seen in clinic 11/06/17. She was taking her ASA and plavix.   History of Present Illness:   Natasha Roach presented with code STEMI. Her chest pain began 1.5 hours prior to arrival after using cocaine. Non compliant with medications. Continues to smoke. Chest pain got worse and EMS was called. Found to have anterior lateral STEMI elevation. She received 324mg  of ASA and SL nitro.    Past Medical History:  Diagnosis Date  . CAD (coronary artery disease) 04/23/2016   a. suspected cocaine induced NSTEMI 12/2015; pt declined cath. b. STEMI 03/2016 due to cocaine, occ mLAD s/p Synergy stent, EF 40% by cath, 55% by echo.  . Cocaine abuse (HCC)   . H/O noncompliance with medical treatment, presenting hazards to health   . HTN (hypertension)   . Hyperlipidemia LDL goal <70   . Ischemic cardiomyopathy   . 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 Morbid obesity (HCC)   . Prolonged QT interval 04/23/2016  . Substance abuse (HCC)   . Tobacco abuse   . Ventricular tachycardia, sustained (HCC)     a. at time of STEMI 03/2016.    Past Surgical History:  Procedure Laterality Date  . ABDOMINAL HYSTERECTOMY    . BREAST SURGERY     abcess  . CARDIAC CATHETERIZATION  2004  . CARDIAC CATHETERIZATION N/A 04/21/2016   Procedure: Left Heart Cath and Coronary Angiography;  Surgeon: Lennette Bihari, MD;  Location: [facility] INVASIVE CV LAB;  Service: Cardiovascular;  Laterality: N/A;  . CARDIAC CATHETERIZATION N/A 04/21/2016   Procedure: Coronary Stent Intervention;  Surgeon: Lennette Bihari, MD;  Location: [facility] INVASIVE CV LAB;  Service: Cardiovascular;  Laterality: N/A;  . CORONARY STENT INTERVENTION N/A 10/27/2017   Procedure: CORONARY STENT INTERVENTION;  Surgeon: Swaziland, Peter M, MD;  Location: [facility] INVASIVE CV LAB;  Service: Cardiovascular;  Laterality: N/A;  . CORONARY/GRAFT ACUTE MI REVASCULARIZATION N/A 10/27/2017   Procedure: Coronary/Graft Acute MI Revascularization;  Surgeon: Swaziland, Peter M, MD;  Location: [facility] INVASIVE CV LAB;  Service: Cardiovascular;  Laterality: N/A;  . ELECTROPHYSIOLOGIC STUDY N/A 04/21/2016   Procedure: Cardioversion;  Surgeon: Lennette Bihari, MD;  Location: [facility] INVASIVE CV LAB;  Service: Cardiovascular;  Laterality: N/A;  . LEFT HEART CATH AND CORONARY ANGIOGRAPHY N/A 10/27/2017   Procedure: LEFT HEART CATH AND CORONARY ANGIOGRAPHY;  Surgeon: Swaziland, Peter M, MD;  Location: [facility] INVASIVE CV LAB;  Service: Cardiovascular;  Laterality: N/A;     Medications Prior to Admission: Prior to Admission medications   Medication Sig Start Date End Date Taking? Authorizing Provider  aspirin  81 MG EC tablet Take 1 tablet (81 mg total) by mouth daily. 04/23/16   Dunn, Tacey Ruizayna N, PA-C  atorvastatin (LIPITOR) 80 MG tablet Take 1 tablet (80 mg total) by mouth every evening. 10/28/17   Tereso NewcomerWeaver, Scott T, PA-C  clopidogrel (PLAVIX) 75 MG tablet Take 1 tablet (75 mg total) by mouth daily. 10/28/17 10/28/18  Tereso NewcomerWeaver, Scott T, PA-C  nitroGLYCERIN (NITROSTAT) 0.4 MG SL tablet Place 1 tablet (0.4 mg total) under  the tongue every 5 (five) minutes x 3 doses as needed for chest pain. 10/28/17   Tereso NewcomerWeaver, Scott T, PA-C     Allergies:   No Known Allergies  Social History:   Social History   Socioeconomic History  . Marital status: Significant Other    Spouse name: Not on file  . Number of children: Not on file  . Years of education: Not on file  . Highest education level: Not on file  Occupational History  . Not on file  Social Needs  . Financial resource strain: Not on file  . Food insecurity:    Worry: Not on file    Inability: Not on file  . Transportation needs:    Medical: Not on file    Non-medical: Not on file  Tobacco Use  . Smoking status: Current Every Day Smoker    Packs/day: 1.00    Years: 40.00    Pack years: 40.00    Types: Cigarettes  . Smokeless tobacco: Never Used  Substance and Sexual Activity  . Alcohol use: No  . Drug use: Yes    Types: Cocaine    Comment: last use 04/21/16  . Sexual activity: Yes  Lifestyle  . Physical activity:    Days per week: Not on file    Minutes per session: Not on file  . Stress: Not on file  Relationships  . Social connections:    Talks on phone: Not on file    Gets together: Not on file    Attends religious service: Not on file    Active member of club or organization: Not on file    Attends meetings of clubs or organizations: Not on file    Relationship status: Not on file  . Intimate partner violence:    Fear of current or ex partner: Not on file    Emotionally abused: Not on file    Physically abused: Not on file    Forced sexual activity: Not on file  Other Topics Concern  . Not on file  Social History Narrative  . Not on file    Family History:   The patient's family history includes CAD in her father.    ROS:  Please see the history of present illness.  All other ROS reviewed and negative.     Physical Exam/Data:   Vitals:   01/16/18 1201 01/16/18 1222  BP: (!) 89/69   Pulse: (!) 51   Resp: 16   Temp: 98 F  (36.7 C)   TempSrc: Oral   SpO2: 100% 99%  Weight: 79.4 kg   Height: 5\' 7"  (1.702 m)    No intake or output data in the 24 hours ending 01/16/18 1326 Filed Weights   01/16/18 1201  Weight: 79.4 kg   Body mass index is 27.41 kg/m.  General:  Well nourished, well developed, in no acute distress HEENT: normal Lymph: no adenopathy Neck: no JVD Endocrine:  No thryomegaly Vascular: No carotid bruits; FA pulses 2+ bilaterally without bruits  Cardiac:  normal  S1, S2; RRR; no murmur  Lungs:  clear to auscultation bilaterally, no wheezing, rhonchi or rales  Abd: soft, nontender, no hepatomegaly  Ext: no  edema Musculoskeletal:  No deformities, BUE and BLE strength normal and equal Skin: warm and dry  Neuro:  CNs 2-12 intact, no focal abnormalities noted Psych:  Normal affect    EKG:  The ECG that was done today  was personally reviewed and demonstrates Sinus bradycardia at 51 bpm and inferior lateral ST elevation   Relevant CV Studies:  Coronary/Graft Acute MI Revascularization  10/27/17  CORONARY STENT INTERVENTION  LEFT HEART CATH AND CORONARY ANGIOGRAPHY  Conclusion     Prox LAD-2 lesion is 15% stenosed.  Prox LAD-1 lesion is 85% stenosed.  Post intervention, there is a 0% residual stenosis.  A drug-eluting stent was successfully placed using a STENT SYNERGY DES 4X12.  The left ventricular systolic function is normal.  LV end diastolic pressure is normal.  The left ventricular ejection fraction is 55-65% by visual estimate.   1. Single vessel obstructive CAD. Thrombotic lesion in the LAD just proximal to prior stent. 2. Good LV function 3. Normal LVEDP 4. Successful PCI of the LAD with DES x 1.  Recommend uninterrupted dual antiplatelet therapy with Aspirin 81mg  daily and Ticagrelor 90mg  twice daily for a minimum of 12 months (ACS - Class I recommendation).   Need to focus on risk factor modification including cessation of tobacco and cocaine use.    Echo  10/28/17 Study Conclusions  - Left ventricle: The cavity size was normal. Wall thickness was   increased in a pattern of mild LVH. Systolic function was normal.   The estimated ejection fraction was in the range of 60% to 65%.   Wall motion was normal; there were no regional wall motion   abnormalities. Indeterminate diastolic function.  Laboratory Data:  Chemistry Recent Labs  Lab 01/16/18 1200 01/16/18 1233  NA 137 139  K 4.3 4.0  CL 108 107  CO2 19*  --   GLUCOSE 203* 208*  BUN 11 11  CREATININE 0.78 0.60  CALCIUM 8.5*  --   GFRNONAA >60  --   GFRAA >60  --   ANIONGAP 10  --     Recent Labs  Lab 01/16/18 1200  PROT 5.1*  ALBUMIN 3.1*  AST 16  ALT 18  ALKPHOS 74  BILITOT 0.7   Hematology Recent Labs  Lab 01/16/18 1200 01/16/18 1233  WBC 18.1*  --   RBC 5.61*  --   HGB 15.2* 15.3*  HCT 48.3* 45.0  MCV 86.1  --   MCH 27.1  --   MCHC 31.5  --   RDW 12.7  --   PLT 236  --    Cardiac EnzymesNo results for input(s): TROPONINI in the last 168 hours.  Recent Labs  Lab 01/16/18 1214  TROPIPOC 0.00    BNPNo results for input(s): BNP, PROBNP in the last 168 hours.  DDimer No results for input(s): DDIMER in the last 168 hours.  Radiology/Studies:  No results found.  Assessment and Plan:   1. Inferiorlateral STEMI in setting of cocaine abuse and non compliance with DAPT  - Pending emergent cath   Severity of Illness: The appropriate patient status for this patient is INPATIENT. Inpatient status is judged to be reasonable and necessary in order to provide the required intensity of service to ensure the patient's safety. The patient's presenting symptoms, physical exam findings, and initial radiographic and laboratory data in  the context of their chronic comorbidities is felt to place them at high risk for further clinical deterioration. Furthermore, it is not anticipated that the patient will be medically stable for discharge from the hospital within 2  midnights of admission. The following factors support the patient status of inpatient.   " The patient's presenting symptoms include CP. " The worrisome physical exam findings include N/A " The initial radiographic and laboratory data are worrisome because of STEMI " The chronic co-morbidities include polysubstance abuse, non compliance, recurrent MI   * I certify that at the point of admission it is my clinical judgment that the patient will require inpatient hospital care spanning beyond 2 midnights from the point of admission due to high intensity of service, high risk for further deterioration and high frequency of surveillance required.*    For questions or updates, please contact [facility] Please consult [email or web address] for contact info under        Lorelei Pont, PA  01/16/2018 1:26 PM

## 2018-01-17 ENCOUNTER — Encounter (HOSPITAL_COMMUNITY): Payer: Self-pay

## 2018-01-17 ENCOUNTER — Inpatient Hospital Stay (HOSPITAL_COMMUNITY): Payer: Self-pay

## 2018-01-17 LAB — BASIC METABOLIC PANEL
Anion gap: 11 (ref 5–15)
BUN: 10 mg/dL (ref 6–20)
CO2: 19 mmol/L — ABNORMAL LOW (ref 22–32)
Calcium: 8.7 mg/dL — ABNORMAL LOW (ref 8.9–10.3)
Chloride: 109 mmol/L (ref 98–111)
Creatinine, Ser: 0.85 mg/dL (ref 0.44–1.00)
GFR calc Af Amer: >60 mL/min (ref >60)
GFR calc non Af Amer: >60 mL/min (ref >60)
Glucose, Bld: 124 mg/dL — ABNORMAL HIGH (ref 70–99)
Potassium: 4.1 mmol/L (ref 3.5–5.1)
Sodium: 139 mmol/L (ref 135–145)

## 2018-01-17 LAB — CBC
HCT: 45.3 % (ref 36.0–46.0)
HEMOGLOBIN: 14.2 g/dL (ref 12.0–15.0)
MCH: 26.7 pg (ref 26.0–34.0)
MCHC: 31.3 g/dL (ref 30.0–36.0)
MCV: 85.2 fL (ref 78.0–100.0)
Platelets: 229 10*3/uL (ref 150–400)
RBC: 5.32 MIL/uL — ABNORMAL HIGH (ref 3.87–5.11)
RDW: 13 % (ref 11.5–15.5)
WBC: 16.6 10*3/uL — ABNORMAL HIGH (ref 4.0–10.5)

## 2018-01-17 LAB — LIPID PANEL
Cholesterol: 164 mg/dL (ref 0–200)
HDL: 41 mg/dL (ref >40)
LDL Cholesterol: 79 mg/dL (ref 0–99)
TRIGLYCERIDES: 218 mg/dL — AB (ref <150)
Total CHOL/HDL Ratio: 4 RATIO
VLDL: 44 mg/dL — AB (ref 0–40)

## 2018-01-17 LAB — TROPONIN I
Troponin I: 5.48 ng/mL (ref <0.03)
Troponin I: 5.55 ng/mL (ref <0.03)

## 2018-01-17 MED ORDER — INFLUENZA VAC SPLIT QUAD 0.5 ML IM SUSY: 0.5000 mL | PREFILLED_SYRINGE | INTRAMUSCULAR | Status: DC

## 2018-01-17 MED ORDER — IOPAMIDOL (ISOVUE-370) INJECTION 76%
INTRAVENOUS | Status: AC
  Administered 2018-01-17: 100 mL
  Filled 2018-01-17: qty 100

## 2018-01-17 MED FILL — Lidocaine HCl Local Preservative Free (PF) Inj 1%: INTRAMUSCULAR | Qty: 30 | Status: AC

## 2018-01-17 NOTE — Care Management Note (Addendum)
Case Management Note  Patient Details  Name: Natasha Roach MRN: [id number] Date of Birth: 06-16-1960  Subjective/Objective:  57 y.o. female presenting with inferolateral STEMI in the setting of crack cocaine use, treated with PCI of the diagonal branch (balloon angioplasty)                 Action/Plan: CM met with patient to discuss transitional needs; patient gave CM permission to discuss POC while friends were present. Patient lives at home with a friend, independent with ADLs PTA. Patient verbalized being followed for cardiac needs by [place] at [facility], with next appointment scheduled in October 2019. CM attempted to arrange a hospital f/u appointment at [facility], with no appointments being scheduled at this time. Per [facility], patient would need to call on 01/22/18 for scheduling, but is still able to utilize their pharmacy for Rx needs with AVS updated; patient reported using [facility] in the past. Patient indicated having a friend to provide transportation home. No further needs from CM at this time.   Expected Discharge Date:                  Expected Discharge Plan:  Home/Self Care  In-House Referral:  Clinical Social Work  Discharge planning Services  CM Consult  Post Acute Care Choice:  NA Choice offered to:  NA  DME Arranged:  N/A DME Agency:  NA  HH Arranged:  NA HH Agency:  NA  Status of Service:  Completed, signed off  If discussed at [place] of Stay Meetings, dates discussed:    Additional Comments:  Midge Minium RN, BSN, NCM-BC, ACM-RN [phone] 01/17/2018, 11:42 AM

## 2018-01-17 NOTE — Progress Notes (Signed)
CARDIAC REHAB PHASE I   PRE:  Rate/Rhythm: 76 SR  BP:  Supine: 136/93  Sitting:   Standing:    SaO2: 96%RA  MODE:  Ambulation: 740 ft   POST:  Rate/Rhythm: 90 SR  BP:  Supine: 160/92  Sitting:   Standing:    SaO2: 100%RA 1100-1202 Pt walked 740 ft on RA with steady gait and no CP. Tolerated well. MI education completed with pt who voiced understanding. Stressed importance of plavix and taking meds. Gave fake cigarette and smoking cessation handout. Pt stated that she would be willing to go to Rehab if it can be offered . Ready to make changes but knows she needs help. Gave ex ed and reviewed NTG use, referred to [place] [facility] but unsure if pt will attend. Left heart healthy diet.   Luetta Nutting, RN BSN  01/17/2018 11:57 AM

## 2018-01-17 NOTE — Progress Notes (Signed)
Progress Note  Patient Name: Natasha Roach Date of Encounter: 01/17/2018  Primary Cardiologist: Nicki Guadalajara, MD   Subjective   The patient feels very well.  She denies chest pain or shortness of breath.  Chest pain resolved last night.  Inpatient Medications    Scheduled Meds: . aspirin EC  81 mg Oral Daily  . atorvastatin  80 mg Oral QPM  . clopidogrel  75 mg Oral Daily  . enoxaparin (LOVENOX) injection  40 mg Subcutaneous Q24H  . [START ON 01/18/2018] Influenza vac split quadrivalent PF  0.5 mL Intramuscular Tomorrow-1000  . sodium chloride flush  3 mL Intravenous Q12H   Continuous Infusions: . sodium chloride     PRN Meds: sodium chloride, acetaminophen, nitroGLYCERIN, sodium chloride flush   Vital Signs    Vitals:   01/17/18 0600 01/17/18 0700 01/17/18 0800 01/17/18 0900  BP: 140/84 139/76 (!) 131/94 129/75  Pulse: 78 79 81 79  Resp: 18 17 (!) 23 (!) 25  Temp:   98.1 F (36.7 C)   TempSrc:   Oral   SpO2: 98% 97% 100% 100%  Weight:      Height:        Intake/Output Summary (Last 24 hours) at 01/17/2018 0917 Last data filed at 01/17/2018 0800 Gross per 24 hour  Intake 1424.27 ml  Output 1950 ml  Net -525.73 ml   Filed Weights   01/16/18 1201 01/17/18 0440  Weight: 79.4 kg 77 kg    Telemetry    Normal sinus rhythm- Personally Reviewed  ECG    NSR 71 bpm, NSST-T abnormality - Personally Reviewed  Physical Exam  Alert, oriented woman GEN: No acute distress.   Neck: No JVD Cardiac: RRR, no murmurs, rubs, or gallops.  Respiratory: Clear to auscultation bilaterally. GI: Soft, nontender, non-distended  MS: No edema; No deformity. Right groin site clear. Neuro:  Nonfocal  Psych: Normal affect   Labs    Chemistry Recent Labs  Lab 01/16/18 1200 01/16/18 1233  NA 137 139  K 4.3 4.0  CL 108 107  CO2 19*  --   GLUCOSE 203* 208*  BUN 11 11  CREATININE 0.78 0.60  CALCIUM 8.5*  --   PROT 5.1*  --   ALBUMIN 3.1*  --   AST 16  --     ALT 18  --   ALKPHOS 74  --   BILITOT 0.7  --   GFRNONAA >60  --   GFRAA >60  --   ANIONGAP 10  --      Hematology Recent Labs  Lab 01/16/18 1200 01/16/18 1233 01/17/18 0752  WBC 18.1*  --  16.6*  RBC 5.61*  --  5.32*  HGB 15.2* 15.3* 14.2  HCT 48.3* 45.0 45.3  MCV 86.1  --  85.2  MCH 27.1  --  26.7  MCHC 31.5  --  31.3  RDW 12.7  --  13.0  PLT 236  --  229    Cardiac Enzymes Recent Labs  Lab 01/16/18 1930 01/17/18 0212  TROPONINI 1.42* 5.48*    Recent Labs  Lab 01/16/18 1214  TROPIPOC 0.00     BNPNo results for input(s): BNP, PROBNP in the last 168 hours.   DDimer No results for input(s): DDIMER in the last 168 hours.   Radiology    No results found.  Cardiac Studies   Echo: Study Conclusions  - Left ventricle: The cavity size was normal. There was mild   concentric hypertrophy. Systolic function  was normal. The   estimated ejection fraction was in the range of 50% to 55%. Wall   motion was normal; there were no regional wall motion   abnormalities. - Aorta: The aorta was mildly calcified with mobile density in the   aortic arch that could represent thrombus. Recommend Chest CTA   for further evaluation. - Mitral valve: Mild focal calcification of the anterior leaflet.   There was trivial regurgitation. - Pulmonary arteries: Systolic pressure could not be accurately   estimated.  Impressions:  - The aorta was mildly calcified with small mobile density in the   aortic arch.  Cardiac Cath 01/16/2018: Conclusion     Ost 1st Diag lesion is 90% stenosed.  1st Diag lesion is 100% stenosed.  Previously placed Prox LAD-1 drug eluting stent is widely patent.  Balloon angioplasty was performed.  Prox LAD-2 lesion is 15% stenosed.  There is mild left ventricular systolic dysfunction.  LV end diastolic pressure is normal.  The left ventricular ejection fraction is 45-50% by visual estimate.  Post intervention, there is a 30% residual  stenosis.  Balloon angioplasty was performed using a BALLOON SAPPHIRE 3.5X10.    1. Single vessel obstructive CAD involving the origin of a large first diagonal branch with thrombus. 2. Continued patency of stents in the LAD 3. Mild LV dysfunction 4. Normal LVEDP 5. Successful POBA of the first diagonal.  Plan: will continue IV Aggrastat for 18 hours. Resume DAPT with ASA and Plavix. Check Echo. Needs counseling for drug and tobacco cessation. Avoid beta blocker due to active cocaine use.  Recommend uninterrupted dual antiplatelet therapy with Aspirin 81mg  daily and Clopidogrel 75mg  daily for a minimum of 12 months (ACS - Class I recommendation).    Patient Profile     57 y.o. female presenting with inferolateral STEMI in the setting of crack cocaine use, treated with PCI of the diagonal branch (balloon angioplasty)  Assessment & Plan    1.  STEMI involving the diagonal branch of the LAD: This occurred in the setting of cocaine use.  Counseling regarding polysubstance abuse is done this morning.  I am going to place a consultation with the social worker for outpatient resources available to this patient.  She reports that she has been clean for over 6 months until her recent cocaine use.  She will require dual antiplatelet therapy with aspirin and clopidogrel for at least one year.  We will continue atorvastatin.  No beta-blocker with active cocaine use.  LV function is preserved with an LVEF greater than 50%.  2.  Polysubstance abuse: Counseling done.  Social work consult to be placed.  3.  Aortic arch atheroma/density: CTA of the chest recommended.  Will order.  Disposition: Once the patient walks with cardiac rehab and undergoes social work consultation, anticipate discharge later today.  Also await CTA result.  For questions or updates, please contact [facility] Please consult [email or web address] for contact info under        Signed, Tonny BollmanMichael Denee Boeder, MD  01/17/2018, 9:17 AM

## 2018-01-17 NOTE — Plan of Care (Signed)
  Problem: Education: Goal: Knowledge of General Education information will improve Description Including pain rating scale, medication(s)/side effects and non-pharmacologic comfort measures Outcome: Adequate for Discharge   Problem: Health Behavior/Discharge Planning: Goal: Ability to manage health-related needs will improve Outcome: Adequate for Discharge   Problem: Clinical Measurements: Goal: Ability to maintain clinical measurements within normal limits will improve Outcome: Adequate for Discharge Goal: Will remain free from infection Outcome: Adequate for Discharge Goal: Diagnostic test results will improve Outcome: Adequate for Discharge Goal: Respiratory complications will improve Outcome: Adequate for Discharge Goal: Cardiovascular complication will be avoided Outcome: Adequate for Discharge   Problem: Activity: Goal: Risk for activity intolerance will decrease Outcome: Adequate for Discharge   Problem: Nutrition: Goal: Adequate nutrition will be maintained Outcome: Adequate for Discharge   Problem: Coping: Goal: Level of anxiety will decrease Outcome: Adequate for Discharge   Problem: Elimination: Goal: Will not experience complications related to bowel motility Outcome: Adequate for Discharge Goal: Will not experience complications related to urinary retention Outcome: Adequate for Discharge   Problem: Pain Managment: Goal: General experience of comfort will improve Outcome: Adequate for Discharge   Problem: Safety: Goal: Ability to remain free from injury will improve Outcome: Adequate for Discharge   Problem: Skin Integrity: Goal: Risk for impaired skin integrity will decrease Outcome: Adequate for Discharge   Problem: Education: Goal: Understanding of CV disease, CV risk reduction, and recovery process will improve Outcome: Adequate for Discharge   Problem: Activity: Goal: Ability to return to baseline activity level will improve Outcome:  Adequate for Discharge   Problem: Cardiovascular: Goal: Ability to achieve and maintain adequate cardiovascular perfusion will improve Outcome: Adequate for Discharge Goal: Vascular access site(s) Level 0-1 will be maintained Outcome: Adequate for Discharge   Problem: Education: Goal: Understanding of discharge needs will improve Outcome: Adequate for Discharge   Problem: Physical Regulation: Goal: Complications related to the disease process, condition or treatment will be avoided or minimized Outcome: Adequate for Discharge   Problem: Education: Goal: Understanding of cardiac disease, CV risk reduction, and recovery process will improve Outcome: Adequate for Discharge Goal: Understanding of medication regimen will improve Outcome: Adequate for Discharge Goal: Individualized Educational Video(s) Outcome: Adequate for Discharge   Problem: Activity: Goal: Ability to tolerate increased activity will improve Outcome: Adequate for Discharge   Problem: Cardiac: Goal: Ability to achieve and maintain adequate cardiopulmonary perfusion will improve Outcome: Adequate for Discharge Goal: Vascular access site(s) Level 0-1 will be maintained Outcome: Adequate for Discharge   Problem: Health Behavior/Discharge Planning: Goal: Ability to safely manage health-related needs after discharge will improve Outcome: Adequate for Discharge

## 2018-01-17 NOTE — Progress Notes (Signed)
Patient discharged home. IVs taken out. Vital signs stable. Discharge education done and AVS sent with patient.

## 2018-01-17 NOTE — Progress Notes (Signed)
CSW received consult for out patient substance use resources. CSW spoke with the patient at bedside. Patient was alert and oriented. Patient states she was ready for change and was hoping to find a inpatient treatment facility. CSW provided patient with resources. Patient expressed appreciation for assistance.   Antony Blackbird, [facility] Clinical Social Worker [phone]

## 2018-01-17 NOTE — Discharge Summary (Signed)
Discharge Summary    Patient ID: Natasha Roach,  MRN: [id number], DOB/AGE: Mar 19, 1961 57 y.o.  Admit date: 01/16/2018 Discharge date: 01/17/2018  Primary Care Provider: Patient, No Pcp Per Primary Cardiologist: Dr. Tresa Endo   Discharge Diagnoses    Principal Problem:   Acute ST elevation myocardial infarction (STEMI) of anterior wall [facility]) Active Problems:   Cocaine abuse (HCC)   HTN (hypertension)   Diabetes type 2, controlled (HCC)   Tobacco abuse   Acute ST elevation myocardial infarction (STEMI) involving left anterior descending (LAD) coronary artery (HCC)   Allergies No Known Allergies  Diagnostic Studies/Procedures    Cath: 01/16/18   Ost 1st Diag lesion is 90% stenosed.  1st Diag lesion is 100% stenosed.  Previously placed Prox LAD-1 drug eluting stent is widely patent.  Balloon angioplasty was performed.  Prox LAD-2 lesion is 15% stenosed.  There is mild left ventricular systolic dysfunction.  LV end diastolic pressure is normal.  The left ventricular ejection fraction is 45-50% by visual estimate.  Post intervention, there is a 30% residual stenosis.  Balloon angioplasty was performed using a BALLOON SAPPHIRE 3.5X10.    1. Single vessel obstructive CAD involving the origin of a large first diagonal branch with thrombus. 2. Continued patency of stents in the LAD 3. Mild LV dysfunction 4. Normal LVEDP 5. Successful POBA of the first diagonal.  Plan: will continue IV Aggrastat for 18 hours. Resume DAPT with ASA and Plavix. Check Echo. Needs counseling for drug and tobacco cessation. Avoid beta blocker due to active cocaine use.  Recommend uninterrupted dual antiplatelet therapy with Aspirin 81mg  daily and Clopidogrel 75mg  daily for a minimum of 12 months (ACS - Class I recommendation).  TTE: 01/16/18  Study Conclusions  - Left ventricle: The cavity size was normal. There was mild   concentric hypertrophy. Systolic function was  normal. The   estimated ejection fraction was in the range of 50% to 55%. Wall   motion was normal; there were no regional wall motion   abnormalities. - Aorta: The aorta was mildly calcified with mobile density in the   aortic arch that could represent thrombus. Recommend Chest CTA   for further evaluation. - Mitral valve: Mild focal calcification of the anterior leaflet.   There was trivial regurgitation. - Pulmonary arteries: Systolic pressure could not be accurately   estimated.  Impressions:  - The aorta was mildly calcified with small mobile density in the   aortic arch. _____________   History of Present Illness     57 y.o. female with CAD s/p multiple MI, ongoing cocaine abuse, HTN, HLD, Vf arrest, DM, tobacco abuse and non compliance presented with code STEMI.   Hx ofcoronary artery diseases/p anterior STEMI in 03/2016 treated with PCI of the LAD complicated by VFib arrest with reperfusion. Patient again admitted 10/2017 with anterior STEMI. Cardiac catheterization demonstrated patent stent in the LAD but new 85% thrombotic lesion in the LAD prior to the stent s/p emergent PCI with drug-eluting stent to the LAD. The RCA and left circumflex were found to be patent. Left AMA on ASA and plavix. Last seen in clinic 11/06/17. She was taking her ASA and plavix.   Her chest pain began about 1.5 hours prior to arrival after using cocaine. Received 324mg  ASA and SL nitro in route. Brought directly to the cath lab for emergent cardiac cath.   Hospital Course     1.  STEMI involving the diagonal branch of the LAD: This occurred  in the setting of cocaine use.  Counseling regarding polysubstance abuse was given this admission. She reported that she had been clean for over 6 months until her recent cocaine use.  Plan for dual antiplatelet therapy with aspirin and clopidogrel for at least one year.  We will continue atorvastatin.  No beta-blocker with active cocaine use.  LV function is  preserved with an LVEF greater than 50%.  2.  Polysubstance abuse: Counseling done.  Social work consult to be placed.  3.  Aortic arch atheroma/density: CTA of the chest recommended and completed without evidence of of aortic dissection, and abnormalities in the aorta noted.   Natasha Roach was seen by Dr. Excell Seltzer and determined stable for discharge home. Follow up in the office has been arranged. Medications are listed below.   ____________  Discharge Vitals Blood pressure (!) 140/93, pulse 87, temperature 98.2 F (36.8 C), temperature source Oral, resp. rate (!) 26, height 5\' 7"  (1.702 m), weight 77 kg, SpO2 98 %.  Filed Weights   01/16/18 1201 01/17/18 0440  Weight: 79.4 kg 77 kg    Labs & Radiologic Studies    CBC Recent Labs    01/16/18 1200 01/16/18 1233 01/17/18 0752  WBC 18.1*  --  16.6*  NEUTROABS 12.8*  --   --   HGB 15.2* 15.3* 14.2  HCT 48.3* 45.0 45.3  MCV 86.1  --  85.2  PLT 236  --  229   Basic Metabolic Panel Recent Labs    40/98/11 1200 01/16/18 1233 01/17/18 0752  NA 137 139 139  K 4.3 4.0 4.1  CL 108 107 109  CO2 19*  --  19*  GLUCOSE 203* 208* 124*  BUN 11 11 10   CREATININE 0.78 0.60 0.85  CALCIUM 8.5*  --  8.7*   Liver Function Tests Recent Labs    01/16/18 1200  AST 16  ALT 18  ALKPHOS 74  BILITOT 0.7  PROT 5.1*  ALBUMIN 3.1*   No results for input(s): LIPASE, AMYLASE in the last 72 hours. Cardiac Enzymes Recent Labs    01/16/18 1930 01/17/18 0212 01/17/18 0752  TROPONINI 1.42* 5.48* 5.55*   BNP Invalid input(s): POCBNP D-Dimer No results for input(s): DDIMER in the last 72 hours. Hemoglobin A1C Recent Labs    01/16/18 1408  HGBA1C 6.9*   Fasting Lipid Panel Recent Labs    01/17/18 0212  CHOL 164  HDL 41  LDLCALC 79  TRIG 218*  CHOLHDL 4.0   Thyroid Function Tests No results for input(s): TSH, T4TOTAL, T3FREE, THYROIDAB in the last 72 hours.  Invalid input(s): FREET3 _____________  Ct Angio  Chest Aorta W/cm &/or Wo/cm  Result Date: 01/17/2018 CLINICAL DATA:  57 year old female with chest pain, possible abnormality in the aortic arch EXAM: CT ANGIOGRAPHY CHEST WITH CONTRAST TECHNIQUE: Multidetector CT imaging of the chest was performed using the standard protocol during bolus administration of intravenous contrast. Multiplanar CT image reconstructions and MIPs were obtained to evaluate the vascular anatomy. CONTRAST:  ISOVUE-370 IOPAMIDOL (ISOVUE-370) INJECTION 76% COMPARISON:  Chest x-ray 10/27/2017 FINDINGS: CTA CHEST FINDINGS Cardiovascular: 2 vessel arch anatomy. The right brachiocephalic and left common carotid artery share a common origin. The aortic root is normal in caliber. There is no effacement of the sino-tubular junction. The ascending, transverse and descending thoracic aorta are all normal in caliber. No evidence of aneurysm or dissection. Calcifications are present in the coronary arteries. There appears to be a metallic stent in the left anterior descending  coronary artery. The heart is at the upper limits of normal for size. No pericardial effusion. Unremarkable main and central pulmonary arteries. Mediastinum/Nodes: Unremarkable CT appearance of the thyroid gland. No suspicious mediastinal or hilar adenopathy. No soft tissue mediastinal mass. The thoracic esophagus is unremarkable. Lungs/Pleura: Lungs are clear. No pleural effusion or pneumothorax. Musculoskeletal: No chest wall abnormality. No acute or significant osseous findings. Upper abdomen: Incidentally noted 1 cm simple cyst in the hepatic dome. No acute abnormality within the visualized upper abdomen. Review of the MIP images confirms the above findings. IMPRESSION: 1. No evidence of aortic dissection, aneurysm or other acute vascular abnormality. 2. No evidence of pneumonia or other acute cardiopulmonary process. 3. Coronary artery disease with a stent visualized in the left anterior descending coronary artery.  Electronically Signed   By: Malachy Moan M.D.   On: 01/17/2018 11:20   Disposition   Pt is being discharged home today in good condition.  Follow-up Plans & Appointments    Follow-up Information    [facility] Follow up.   Why:  Appointments are accepted on Mondays. Please call on 01/22/18 to arrange hospital follow-up appointment. Can use pharmacy for prescription needs. Medications are $4-10.  Contact information: [address] [place] [address] [phone]         Discharge Instructions    Amb Referral to Cardiac Rehabilitation   Complete by:  As directed    Diagnosis:   STEMI PTCA     Call MD for:  redness, tenderness, or signs of infection (pain, swelling, redness, odor or green/yellow discharge around incision site)   Complete by:  As directed    Diet - low sodium heart healthy   Complete by:  As directed    Discharge instructions   Complete by:  As directed    Groin Site Care Refer to this sheet in the next few weeks. These instructions provide you with information on caring for yourself after your procedure. Your caregiver may also give you more specific instructions. Your treatment has been planned according to current medical practices, but problems sometimes occur. Call your caregiver if you have any problems or questions after your procedure. HOME CARE INSTRUCTIONS You may shower 24 hours after the procedure. Remove the bandage (dressing) and gently wash the site with plain soap and water. Gently pat the site dry.  Do not apply powder or lotion to the site.  Do not sit in a bathtub, swimming pool, or whirlpool for 5 to 7 days.  No bending, squatting, or lifting anything over 10 pounds (4.5 kg) as directed by your caregiver.  Inspect the site at least twice daily.  Do not drive home if you are discharged the same day of the procedure. Have someone else drive you.  You may drive 24 hours after the procedure  unless otherwise instructed by your caregiver.  What to expect: Any bruising will usually fade within 1 to 2 weeks.  Blood that collects in the tissue (hematoma) may be painful to the touch. It should usually decrease in size and tenderness within 1 to 2 weeks.  SEEK IMMEDIATE MEDICAL CARE IF: You have unusual pain at the groin site or down the affected leg.  You have redness, warmth, swelling, or pain at the groin site.  You have drainage (other than a small amount of blood on the dressing).  You have chills.  You have a fever or persistent symptoms for more than 72 hours.  You  have a fever and your symptoms suddenly get worse.  Your leg becomes pale, cool, tingly, or numb.  You have heavy bleeding from the site. Hold pressure on the site. .   Increase activity slowly   Complete by:  As directed        Discharge Medications     Medication List    TAKE these medications   aspirin 81 MG EC tablet Take 1 tablet (81 mg total) by mouth daily.   atorvastatin 80 MG tablet Commonly known as:  LIPITOR Take 1 tablet (80 mg total) by mouth every evening.   clopidogrel 75 MG tablet Commonly known as:  PLAVIX Take 1 tablet (75 mg total) by mouth daily.   nitroGLYCERIN 0.4 MG SL tablet Commonly known as:  NITROSTAT Place 1 tablet (0.4 mg total) under the tongue every 5 (five) minutes x 3 doses as needed for chest pain.        Acute coronary syndrome (MI, NSTEMI, STEMI, etc) this admission?: Yes.     AHA/ACC Clinical Performance & Quality Measures: 1. Aspirin prescribed? - Yes 2. ADP Receptor Inhibitor (Plavix/Clopidogrel, Brilinta/Ticagrelor or Effient/Prasugrel) prescribed (includes medically managed patients)? - Yes 3. Beta Blocker prescribed? - No - cocaine use 4. High Intensity Statin (Lipitor 40-80mg  or Crestor 20-40mg ) prescribed? - Yes 5. EF assessed during THIS hospitalization? - Yes 6. For EF <40%, was ACEI/ARB prescribed? - Not Applicable (EF >/= 40%) 7. For EF <40%,  Aldosterone Antagonist (Spironolactone or Eplerenone) prescribed? - Not Applicable (EF >/= 40%) 8. Cardiac Rehab Phase II ordered (Included Medically managed Patients)? - Yes      Outstanding Labs/Studies   FLP/LFTs in 6 weeks.   Duration of Discharge Encounter   Greater than 30 minutes including physician time.  Signed, Laverda Page NP-C 01/17/2018, 4:19 PM

## 2018-01-19 ENCOUNTER — Telehealth (HOSPITAL_COMMUNITY): Payer: Self-pay

## 2018-01-19 NOTE — Telephone Encounter (Signed)
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 Closed referral.

## 2018-01-19 NOTE — Telephone Encounter (Signed)
Attempted to call pt in regards to CR, pt was not available. Was able to leave a message with pt significant other [name] to have the pt return CR phone call.  Need to verify if pt has any insurance and follow up appt.

## 2018-02-02 ENCOUNTER — Encounter (HOSPITAL_COMMUNITY): Payer: Self-pay | Admitting: Emergency Medicine

## 2018-02-02 ENCOUNTER — Emergency Department (HOSPITAL_COMMUNITY)
Admission: EM | Admit: 2018-02-02 | Discharge: 2018-02-02 | Disposition: A | Discharge status: Home / Self Care | Payer: Self-pay | Attending: Emergency Medicine | Admitting: Emergency Medicine

## 2018-02-02 DIAGNOSIS — R519 Headache, unspecified: Secondary | ICD-10-CM

## 2018-02-02 DIAGNOSIS — L03211 Cellulitis of face: Secondary | ICD-10-CM | POA: Insufficient documentation

## 2018-02-02 DIAGNOSIS — I10 Essential (primary) hypertension: Secondary | ICD-10-CM | POA: Insufficient documentation

## 2018-02-02 DIAGNOSIS — Z7982 Long term (current) use of aspirin: Secondary | ICD-10-CM | POA: Insufficient documentation

## 2018-02-02 DIAGNOSIS — R51 Headache: Secondary | ICD-10-CM | POA: Insufficient documentation

## 2018-02-02 DIAGNOSIS — Z79899 Other long term (current) drug therapy: Secondary | ICD-10-CM | POA: Insufficient documentation

## 2018-02-02 DIAGNOSIS — I2510 Atherosclerotic heart disease of native coronary artery without angina pectoris: Secondary | ICD-10-CM | POA: Insufficient documentation

## 2018-02-02 DIAGNOSIS — F17210 Nicotine dependence, cigarettes, uncomplicated: Secondary | ICD-10-CM | POA: Insufficient documentation

## 2018-02-02 MED ORDER — AMOXICILLIN 500 MG PO CAPS
1000.0000 mg | ORAL_CAPSULE | Freq: Once | ORAL | Status: AC
  Administered 2018-02-02: 1000 mg via ORAL
  Filled 2018-02-02: qty 2

## 2018-02-02 MED ORDER — DICLOFENAC SODIUM 50 MG PO TBEC: 50.0000 mg | DELAYED_RELEASE_TABLET | Freq: Two times a day (BID) | ORAL | 0 refills | Status: DC

## 2018-02-02 MED ORDER — DICLOFENAC SODIUM 50 MG PO TBEC
50.0000 mg | DELAYED_RELEASE_TABLET | Freq: Once | ORAL | Status: AC
  Administered 2018-02-02: 50 mg via ORAL
  Filled 2018-02-02: qty 1

## 2018-02-02 MED ORDER — AMOXICILLIN 500 MG PO CAPS: 500.0000 mg | ORAL_CAPSULE | Freq: Three times a day (TID) | ORAL | 0 refills | Status: DC

## 2018-02-02 NOTE — ED Triage Notes (Signed)
Pt reports a sever "shooting pain that goes through my head" since 5pm yesterday.  It comes and goes.  Denies dizziness/N/V.  Reports no hx of headaches

## 2018-02-02 NOTE — ED Provider Notes (Signed)
[facility] EMERGENCY DEPARTMENT Provider Note   CSN: [id number] Arrival date & time: 02/02/18  7846     History   Chief Complaint Chief Complaint  Patient presents with  . Headache    HPI Natasha Roach is a 57 y.o. female.  Intermittent lightening-like pain in right cheek radiating to bitemporal area since 5 PM yesterday.  No persistent headache, neuro deficits, stiff neck, visual changes, facial asymmetry, arm or leg weakness.  Additionally patient has tenderness in the right cheek that is improved over the past several days.  She thinks it may be related to a tooth infect  Severity of symptoms is mild.  Nothing makes symptoms better or worse.ion.     Past Medical History:  Diagnosis Date  . CAD (coronary artery disease) 04/23/2016   a. suspected cocaine induced NSTEMI 12/2015; pt declined cath. b. STEMI 03/2016 due to cocaine, occ mLAD s/p Synergy stent, EF 40% by cath, 55% by echo.  . Cocaine abuse (HCC)   . H/O noncompliance with medical treatment, presenting hazards to health   . HTN (hypertension)   . Hyperlipidemia LDL goal <70   . Ischemic cardiomyopathy   . 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 Morbid obesity (HCC)   . Prolonged QT interval 04/23/2016  . Substance abuse (HCC)   . Tobacco abuse   . Ventricular tachycardia, sustained (HCC)    a. at time of STEMI 03/2016.    Patient Active Problem List   Diagnosis Date Noted  . Acute ST elevation myocardial infarction (STEMI) involving left anterior descending (LAD) coronary artery (HCC) 10/27/2017  . Prolonged QT interval 04/23/2016  . CAD (coronary artery disease) 04/23/2016  . H/O noncompliance with medical treatment, presenting hazards to health 04/23/2016  . Hyperlipidemia LDL goal <70   . Ischemic cardiomyopathy   . Acute ST elevation myocardial infarction (STEMI) of anterior wall (HCC) 04/21/2016  . HTN (hypertension) 04/21/2016  . Diabetes type 2,  controlled (HCC) 04/21/2016  . Tobacco abuse 04/21/2016  . Ventricular tachycardia, sustained (HCC) 04/21/2016  . Morbid obesity (HCC) 01/01/2016  . Acute coronary syndrome (HCC) 12/31/2015  . Cocaine abuse (HCC) 12/31/2015  . Leukocytosis 12/31/2015    Past Surgical History:  Procedure Laterality Date  . ABDOMINAL HYSTERECTOMY    . BREAST SURGERY     abcess  . CARDIAC CATHETERIZATION  2004  . CARDIAC CATHETERIZATION N/A 04/21/2016   Procedure: Left Heart Cath and Coronary Angiography;  Surgeon: Lennette Bihari, MD;  Location: [facility] INVASIVE CV LAB;  Service: Cardiovascular;  Laterality: N/A;  . CARDIAC CATHETERIZATION N/A 04/21/2016   Procedure: Coronary Stent Intervention;  Surgeon: Lennette Bihari, MD;  Location: [facility] INVASIVE CV LAB;  Service: Cardiovascular;  Laterality: N/A;  . CORONARY STENT INTERVENTION N/A 10/27/2017   Procedure: CORONARY STENT INTERVENTION;  Surgeon: Swaziland, Peter M, MD;  Location: [facility] INVASIVE CV LAB;  Service: Cardiovascular;  Laterality: N/A;  . CORONARY/GRAFT ACUTE MI REVASCULARIZATION N/A 10/27/2017   Procedure: Coronary/Graft Acute MI Revascularization;  Surgeon: Swaziland, Peter M, MD;  Location: [facility] INVASIVE CV LAB;  Service: Cardiovascular;  Laterality: N/A;  . CORONARY/GRAFT ACUTE MI REVASCULARIZATION N/A 01/16/2018   Procedure: Coronary/Graft Acute MI Revascularization;  Surgeon: Swaziland, Peter M, MD;  Location: [facility] INVASIVE CV LAB;  Service: Cardiovascular;  Laterality: N/A;  . ELECTROPHYSIOLOGIC STUDY N/A 04/21/2016   Procedure: Cardioversion;  Surgeon: Lennette Bihari, MD;  Location: [facility] INVASIVE CV LAB;  Service: Cardiovascular;  Laterality: N/A;  .  LEFT HEART CATH AND CORONARY ANGIOGRAPHY N/A 10/27/2017   Procedure: LEFT HEART CATH AND CORONARY ANGIOGRAPHY;  Surgeon: Swaziland, Peter M, MD;  Location: [facility] INVASIVE CV LAB;  Service: Cardiovascular;  Laterality: N/A;  . LEFT HEART CATH AND CORONARY ANGIOGRAPHY N/A 01/16/2018   Procedure: LEFT HEART CATH AND CORONARY  ANGIOGRAPHY;  Surgeon: Swaziland, Peter M, MD;  Location: [facility] INVASIVE CV LAB;  Service: Cardiovascular;  Laterality: N/A;     OB History   None      Home Medications    Prior to Admission medications   Medication Sig Start Date End Date Taking? Authorizing Provider  amoxicillin (AMOXIL) 500 MG capsule Take 1 capsule (500 mg total) by mouth 3 (three) times daily. 02/02/18   Donnetta Hutching, MD  aspirin 81 MG EC tablet Take 1 tablet (81 mg total) by mouth daily. 04/23/16   Dunn, Tacey Ruiz, PA-C  atorvastatin (LIPITOR) 80 MG tablet Take 1 tablet (80 mg total) by mouth every evening. 10/28/17   Tereso Newcomer T, PA-C  clopidogrel (PLAVIX) 75 MG tablet Take 1 tablet (75 mg total) by mouth daily. 10/28/17 10/28/18  Tereso Newcomer T, PA-C  diclofenac (VOLTAREN) 50 MG EC tablet Take 1 tablet (50 mg total) by mouth 2 (two) times daily. 02/02/18   Donnetta Hutching, MD  nitroGLYCERIN (NITROSTAT) 0.4 MG SL tablet Place 1 tablet (0.4 mg total) under the tongue every 5 (five) minutes x 3 doses as needed for chest pain. 10/28/17   Beatrice Lecher, PA-C    Family History Family History  Problem Relation Age of Onset  . CAD Father     Social History Social History   Tobacco Use  . Smoking status: Current Every Day Smoker    Packs/day: 1.00    Years: 40.00    Pack years: 40.00    Types: Cigarettes  . Smokeless tobacco: Never Used  Substance Use Topics  . Alcohol use: No  . Drug use: Yes    Types: Cocaine    Comment: last use 01/16/18     Allergies   Patient has no known allergies.   Review of Systems Review of Systems  All other systems reviewed and are negative.    Physical Exam Updated Vital Signs BP 114/80   Pulse 63   Temp 98.7 F (37.1 C) (Oral)   Resp 16   Ht 5' 7.5" (1.715 m)   Wt 79.4 kg   SpO2 92%   BMI 27.00 kg/m   Physical Exam  Constitutional: She is oriented to person, place, and time. She appears well-developed and well-nourished.  HENT:  Head: Normocephalic and atraumatic.    Eyes: Conjunctivae are normal.  Neck: Neck supple.  Cardiovascular: Normal rate and regular rhythm.  Pulmonary/Chest: Effort normal and breath sounds normal.  Abdominal: Soft. Bowel sounds are normal.  Musculoskeletal: Normal range of motion.  Neurological: She is alert and oriented to person, place, and time.  Skin:  Tender right cheek with slight swelling.  No obvious abscess palpated.  Poor dentition.  Psychiatric: She has a normal mood and affect. Her behavior is normal.  Nursing note and vitals reviewed.    ED Treatments / Results  Labs (all labs ordered are listed, but only abnormal results are displayed) Labs Reviewed - No data to display  EKG None  Radiology No results found.  Procedures Procedures (including critical care time)  Medications Ordered in ED Medications  amoxicillin (AMOXIL) capsule 1,000 mg (1,000 mg Oral Given 02/02/18 0745)  diclofenac (VOLTAREN) EC  tablet 50 mg (50 mg Oral Given 02/02/18 0804)     Initial Impression / Assessment and Plan / ED Course  I have reviewed the triage vital signs and the nursing notes.  Pertinent labs & imaging results that were available during my care of the patient were reviewed by me and considered in my medical decision making (see chart for details).     Patient presents with intermittent headache and right cheek tenderness consistent with cellulitis.  No neuro deficits identified.  Will Rx amoxicillin 500 mg and Voltaren 50 mg.  Final Clinical Impressions(s) / ED Diagnoses   Final diagnoses:  Facial cellulitis  Intractable headache, unspecified chronicity pattern, unspecified headache type    ED Discharge Orders         Ordered    amoxicillin (AMOXIL) 500 MG capsule  3 times daily     02/02/18 0939    diclofenac (VOLTAREN) 50 MG EC tablet  2 times daily     02/02/18 0939           Donnetta Hutching, MD 02/02/18 [phone]

## 2018-02-02 NOTE — Discharge Instructions (Addendum)
You have an infection in your cheek that probably originated from your teeth.  Prescription for antibiotic and pain medicine.  Return if worse.

## 2018-02-06 ENCOUNTER — Encounter: Payer: Self-pay | Admitting: Cardiovascular Disease

## 2018-02-06 ENCOUNTER — Ambulatory Visit (INDEPENDENT_AMBULATORY_CARE_PROVIDER_SITE_OTHER): Payer: Self-pay | Admitting: Cardiovascular Disease

## 2018-02-06 VITALS — BP 118/78 | HR 74 | Ht 67.5 in | Wt 173.0 lb

## 2018-02-06 DIAGNOSIS — Z720 Tobacco use: Secondary | ICD-10-CM

## 2018-02-06 DIAGNOSIS — Z79899 Other long term (current) drug therapy: Secondary | ICD-10-CM

## 2018-02-06 DIAGNOSIS — Z7902 Long term (current) use of antithrombotics/antiplatelets: Secondary | ICD-10-CM

## 2018-02-06 DIAGNOSIS — I2510 Atherosclerotic heart disease of native coronary artery without angina pectoris: Secondary | ICD-10-CM

## 2018-02-06 DIAGNOSIS — Z87898 Personal history of other specified conditions: Secondary | ICD-10-CM

## 2018-02-06 DIAGNOSIS — E785 Hyperlipidemia, unspecified: Secondary | ICD-10-CM

## 2018-02-06 NOTE — Patient Instructions (Signed)
Medication Instructions:  Your physician recommends that you continue on your current medications as directed. Please refer to the Current Medication list given to you today.  If you need a refill on your cardiac medications before your next appointment, please call your pharmacy.   Lab work: PLEASE GO TO [facility] FOR BLOOD WORK  Please return for FASTING labs prior to next appointment (CMET, Lipid)  Our in office lab hours are Monday-Friday 8:00-4:00, closed for lunch 12:45-1:45 pm.  No appointment needed  If you have labs (blood work) drawn today and your tests are completely normal, you will receive your results only by: Marland Kitchen MyChart Message (if you have MyChart) OR . A paper copy in the mail If you have any lab test that is abnormal or we need to change your treatment, we will call you to review the results.  Follow-Up: At [facility], you and your health needs are our priority.  As part of our continuing mission to provide you with exceptional heart care, we have created designated Provider Care Teams.  These Care Teams include your primary Cardiologist (physician) and Advanced Practice Providers (APPs -  Physician Assistants and Nurse Practitioners) who all work together to provide you with the care you need, when you need it. You will need a follow up appointment in 3-4 months.  Please call our office 2 months in advance to schedule this appointment.  You may see Nicki Guadalajara, MD (ONLY)

## 2018-02-08 ENCOUNTER — Encounter: Payer: Self-pay | Admitting: Cardiovascular Disease

## 2018-02-08 NOTE — Progress Notes (Signed)
Cardiology Office Note    Date:  02/08/2018   ID:  Natasha Roach, DOB 1960-07-25, MRN [id number]  PCP:  Patient, No Pcp Per  Cardiologist:  Shelva Majestic, MD   Initial office visit with me  History of Present Illness:  Natasha Roach is a 57 y.o. female who presents for initial office visit evaluation with me.  I had seen her initially in December 2017.  Natasha Roach has a history of tobacco use, hypertension, diabetes mellitus and cocaine use.  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  I saw her on April 21, 2016 when she presented with 10 out of 10 chest pain shortly after using crack cocaine.  She was found to have acute ST segment elevation MI secondary to total occlusion of the proximal LAD with initial TIMI 0 flow and no evidence for collateralization.  She had normal circumflex and small right coronary artery.  She developed reperfusion induced ventricular tachycardia requiring cardioversion/defibrillation and amiodarone bolus.  Ultimately her LAD was successfully stented with a 3.5 x 20 mm DES stent postdilated 3.78 mm with resumption of TIMI-3 flow in a very large LAD vessel.  I had not seen the patient since that presentation.  Apparently, over the past several years she has had recurrent unfortunate issues with repeat chest pain.  She represented in July 2019 again presenting with severe chest pain following cocaine use.  She was found to have a thrombotic lesion in the LAD just proximal to her prior stent which was successfully intervened upon with insertion of a DES stent.  Underwent PCI to her LAD.  She ultimately signed out [place].  She represented again in September and underwent repeat catheterization on January 16, 2018 by Dr. Martinique.  Her LAD stent was patent but she had a 90% stenosis in the diagonal branch which was treated with PTCA and not stented.  An echo Doppler study from January 16, 2018 showed an  EF of 50 to 55% without regional wall motion abnormalities.  The aorta was mildly calcified with a questionable density in the aortic arch since her last event, she is now been on Plavix regularly previously had run out of her medication leading to recurrent events.  Since her most recent intervention, she has not experienced any recurrent chest pain.  Unfortunately she still smokes cigarettes.  She is on atorvastatin 80 mg for hyperlipidemia, aspirin and Plavix.  She presents to the office to reestablish care with me.   Past Medical History:  Diagnosis Date  . CAD (coronary artery disease) 04/23/2016   a. suspected cocaine induced NSTEMI 12/2015; pt declined cath. b. STEMI 03/2016 due to cocaine, occ mLAD s/p Synergy stent, EF 40% by cath, 55% by echo.  . Cocaine abuse ([place])   . H/O noncompliance with medical treatment, presenting hazards to health   . HTN (hypertension)   . Hyperlipidemia LDL goal <70   . Ischemic cardiomyopathy   . 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 Morbid obesity ([place])   . Prolonged QT interval 04/23/2016  . Substance abuse ([place])   . Tobacco abuse   . Ventricular tachycardia, sustained ([place])    a. at time of STEMI 03/2016.    Past Surgical History:  Procedure Laterality Date  . ABDOMINAL HYSTERECTOMY    . BREAST SURGERY     abcess  . CARDIAC CATHETERIZATION  2004  . CARDIAC CATHETERIZATION N/A 04/21/2016  Procedure: Left Heart Cath and Coronary Angiography;  Surgeon: Troy Sine, MD;  Location: [facility];  Service: Cardiovascular;  Laterality: N/A;  . CARDIAC CATHETERIZATION N/A 04/21/2016   Procedure: Coronary Stent Intervention;  Surgeon: Troy Sine, MD;  Location: [facility];  Service: Cardiovascular;  Laterality: N/A;  . CORONARY STENT INTERVENTION N/A 10/27/2017   Procedure: CORONARY STENT INTERVENTION;  Surgeon: Martinique, Peter M, MD;  Location: [facility];  Service: Cardiovascular;  Laterality: N/A;  .  CORONARY/GRAFT ACUTE MI REVASCULARIZATION N/A 10/27/2017   Procedure: Coronary/Graft Acute MI Revascularization;  Surgeon: Martinique, Peter M, MD;  Location: [facility];  Service: Cardiovascular;  Laterality: N/A;  . CORONARY/GRAFT ACUTE MI REVASCULARIZATION N/A 01/16/2018   Procedure: Coronary/Graft Acute MI Revascularization;  Surgeon: Martinique, Peter M, MD;  Location: [facility];  Service: Cardiovascular;  Laterality: N/A;  . ELECTROPHYSIOLOGIC STUDY N/A 04/21/2016   Procedure: Cardioversion;  Surgeon: Troy Sine, MD;  Location: [facility];  Service: Cardiovascular;  Laterality: N/A;  . LEFT HEART CATH AND CORONARY ANGIOGRAPHY N/A 10/27/2017   Procedure: LEFT HEART CATH AND CORONARY ANGIOGRAPHY;  Surgeon: Martinique, Peter M, MD;  Location: [facility];  Service: Cardiovascular;  Laterality: N/A;  . LEFT HEART CATH AND CORONARY ANGIOGRAPHY N/A 01/16/2018   Procedure: LEFT HEART CATH AND CORONARY ANGIOGRAPHY;  Surgeon: Martinique, Peter M, MD;  Location: [facility];  Service: Cardiovascular;  Laterality: N/A;    Current Medications: Outpatient Medications Prior to Visit  Medication Sig Dispense Refill  . amoxicillin (AMOXIL) 500 MG capsule Take 1 capsule (500 mg total) by mouth 3 (three) times daily. 21 capsule 0  . aspirin 81 MG EC tablet Take 1 tablet (81 mg total) by mouth daily. 90 tablet 3  . atorvastatin (LIPITOR) 80 MG tablet Take 1 tablet (80 mg total) by mouth every evening. 30 tablet 11  . clopidogrel (PLAVIX) 75 MG tablet Take 1 tablet (75 mg total) by mouth daily. 30 tablet 11  . nitroGLYCERIN (NITROSTAT) 0.4 MG SL tablet Place 1 tablet (0.4 mg total) under the tongue every 5 (five) minutes x 3 doses as needed for chest pain. 25 tablet 11  . diclofenac (VOLTAREN) 50 MG EC tablet Take 1 tablet (50 mg total) by mouth 2 (two) times daily. 15 tablet 0   No facility-administered medications prior to visit.      Allergies:   Patient has no known allergies.    Social History   Socioeconomic History  . Marital status: Significant Other    Spouse name: Not on file  . Number of children: Not on file  . Years of education: Not on file  . Highest education level: Not on file  Occupational History  . Not on file  Social Needs  . Financial resource strain: Not hard at all  . Food insecurity:    Worry: Never true    Inability: Never true  . Transportation needs:    Medical: Patient refused    Non-medical: Patient refused  Tobacco Use  . Smoking status: Current Every Day Smoker    Packs/day: 1.00    Years: 40.00    Pack years: 40.00    Types: Cigarettes  . Smokeless tobacco: Never Used  Substance and Sexual Activity  . Alcohol use: No  . Drug use: Yes    Types: Cocaine    Comment: last use 01/16/18  . Sexual activity: Yes  Lifestyle  . Physical  activity:    Days per week: Patient refused    Minutes per session: Patient refused  . Stress: To some extent  Relationships  . Social connections:    Talks on phone: Patient refused    Gets together: Patient refused    Attends religious service: Patient refused    Active member of club or organization: Patient refused    Attends meetings of clubs or organizations: Patient refused    Relationship status: Patient refused  Other Topics Concern  . Not on file  Social History Narrative  . Not on file     Family History:  The patient's  family history includes CAD in her father.   ROS General: Negative; No fevers, chills, or night sweats;  HEENT: Negative; No changes in vision or hearing, sinus congestion, difficulty swallowing Pulmonary: Negative; No cough, wheezing, shortness of breath, hemoptysis Cardiovascular: Negative; No chest pain, presyncope, syncope, palpitations GI: Negative; No nausea, vomiting, diarrhea, or abdominal pain GU: Negative; No dysuria, hematuria, or difficulty voiding Musculoskeletal: Negative; no myalgias, joint pain, or weakness Hematologic/Oncology:  Negative; no easy bruising, bleeding Endocrine: Negative; no heat/cold intolerance; no diabetes Neuro: Negative; no changes in balance, headaches Skin: Negative; No rashes or skin lesions Psychiatric: Negative; No behavioral problems, depression Sleep: Negative; No snoring, daytime sleepiness, hypersomnolence, bruxism, restless legs, hypnogognic hallucinations, no cataplexy Other comprehensive 14 point system review is negative.   PHYSICAL EXAM:   VS:  BP 118/78 (BP Location: Left Arm, Patient Position: Sitting, Cuff Size: Normal)   Pulse 74   Ht 5' 7.5" (1.715 m)   Wt 173 lb (78.5 kg)   BMI 26.70 kg/m     Repeat blood pressure by me was 122/78.  Wt Readings from Last 3 Encounters:  02/06/18 173 lb (78.5 kg)  02/02/18 175 lb (79.4 kg)  01/17/18 169 lb 12.1 oz (77 kg)    General: Alert, oriented, no distress.  Skin: normal turgor, no rashes, warm and dry HEENT: Normocephalic, atraumatic. Pupils equal round and reactive to light; sclera anicteric; extraocular muscles intact;  Nose without nasal septal hypertrophy Mouth/Parynx benign; Mallinpatti scale 3 Neck: No JVD, no carotid bruits; normal carotid upstroke Lungs: clear to ausculatation and percussion; no wheezing or rales Chest wall: without tenderness to palpitation Heart: PMI not displaced, RRR, s1 s2 normal, 1/6 systolic murmur, no diastolic murmur, no rubs, gallops, thrills, or heaves Abdomen: soft, nontender; no hepatosplenomehaly, BS+; abdominal aorta nontender and not dilated by palpation. Back: no CVA tenderness Pulses 2+ Musculoskeletal: full range of motion, normal strength, no joint deformities Extremities: no clubbing cyanosis or edema, Homan's sign negative  Neurologic: grossly nonfocal; Cranial nerves grossly wnl Psychologic: Normal mood and affect   Studies/Labs Reviewed:   EKG:  EKG is ordered today.  Normal sinus rhythm at 74 bpm.  Mild T wave abnormality inferolaterally  Recent Labs: BMP Latest Ref  Rng & Units 01/17/2018 01/16/2018 01/16/2018  Glucose 70 - 99 mg/dL 124(H) 208(H) 203(H)  BUN 6 - 20 mg/dL '10 11 11  '$ Creatinine 0.44 - 1.00 mg/dL 0.85 0.60 0.78  Sodium 135 - 145 mmol/L 139 139 137  Potassium 3.5 - 5.1 mmol/L 4.1 4.0 4.3  Chloride 98 - 111 mmol/L 109 107 108  CO2 22 - 32 mmol/L 19(L) - 19(L)  Calcium 8.9 - 10.3 mg/dL 8.7(L) - 8.5(L)     Hepatic Function Latest Ref Rng & Units 01/16/2018 10/27/2017 04/22/2016  Total Protein 6.5 - 8.1 g/dL 5.1(L) 4.8(L) 5.1(L)  Albumin 3.5 - 5.0 g/dL 3.1(L)  3.2(L) 3.0(L)  AST 15 - 41 U/L '16 15 28  '$ ALT 0 - 44 U/L '18 15 18  '$ Alk Phosphatase 38 - 126 U/L 74 86 73  Total Bilirubin 0.3 - 1.2 mg/dL 0.7 0.6 0.7  Bilirubin, Direct 0.1 - 0.5 mg/dL - - <0.1(L)    CBC Latest Ref Rng & Units 01/17/2018 01/16/2018 01/16/2018  WBC 4.0 - 10.5 K/uL 16.6(H) - 18.1(H)  Hemoglobin 12.0 - 15.0 g/dL 14.2 15.3(H) 15.2(H)  Hematocrit 36.0 - 46.0 % 45.3 45.0 48.3(H)  Platelets 150 - 400 K/uL 229 - 236   Lab Results  Component Value Date   MCV 85.2 01/17/2018   MCV 86.1 01/16/2018   MCV 86.6 10/28/2017   Lab Results  Component Value Date   TSH 0.898 04/21/2016   Lab Results  Component Value Date   HGBA1C 6.9 (H) 01/16/2018     BNP No results found for: BNP  ProBNP No results found for: PROBNP   Lipid Panel     Component Value Date/Time   CHOL 164 01/17/2018 0212   TRIG 218 (H) 01/17/2018 0212   HDL 41 01/17/2018 0212   CHOLHDL 4.0 01/17/2018 0212   VLDL 44 (H) 01/17/2018 0212   LDLCALC 79 01/17/2018 0212     RADIOLOGY: Ct Angio Chest Aorta W/cm &/or Wo/cm  Result Date: 01/17/2018 CLINICAL DATA:  57 year old female with chest pain, possible abnormality in the aortic arch EXAM: CT ANGIOGRAPHY CHEST WITH CONTRAST TECHNIQUE: Multidetector CT imaging of the chest was performed using the standard protocol during bolus administration of intravenous contrast. Multiplanar CT image reconstructions and MIPs were obtained to evaluate the vascular  anatomy. CONTRAST:  157m ISOVUE-370 IOPAMIDOL (ISOVUE-370) INJECTION 76% COMPARISON:  Chest x-ray 10/27/2017 FINDINGS: CTA CHEST FINDINGS Cardiovascular: 2 vessel arch anatomy. The right brachiocephalic and left common carotid artery share a common origin. The aortic root is normal in caliber. There is no effacement of the sino-tubular junction. The ascending, transverse and descending thoracic aorta are all normal in caliber. No evidence of aneurysm or dissection. Calcifications are present in the coronary arteries. There appears to be a metallic stent in the left anterior descending coronary artery. The heart is at the upper limits of normal for size. No pericardial effusion. Unremarkable main and central pulmonary arteries. Mediastinum/Nodes: Unremarkable CT appearance of the thyroid gland. No suspicious mediastinal or hilar adenopathy. No soft tissue mediastinal mass. The thoracic esophagus is unremarkable. Lungs/Pleura: Lungs are clear. No pleural effusion or pneumothorax. Musculoskeletal: No chest wall abnormality. No acute or significant osseous findings. Upper abdomen: Incidentally noted 1 cm simple cyst in the hepatic dome. No acute abnormality within the visualized upper abdomen. Review of the MIP images confirms the above findings. IMPRESSION: 1. No evidence of aortic dissection, aneurysm or other acute vascular abnormality. 2. No evidence of pneumonia or other acute cardiopulmonary process. 3. Coronary artery disease with a stent visualized in the left anterior descending coronary artery. Electronically Signed   By: HJacqulynn CadetM.D.   On: 01/17/2018 11:20     Additional studies/ records that were reviewed today include:  Reviewed the patient's hospitalizations, cardiac catheterizations and interventions, laboratory, echo Doppler and CT assessments.    ASSESSMENT:    1. Coronary artery disease involving native coronary artery of native heart without angina pectoris   2. Hyperlipidemia  LDL goal <70   3. Medication management   4. Long term (current) use of antithrombotics/antiplatelets   5. History of crack cocaine use   6. Tobacco abuse  PLAN:  Natasha Roach is a 57 year old African-American female who has a long-standing history of prior crack and cocaine use.  This has resulted in recurrent acute coronary syndrome events contributed by cocaine induced vasospasm.  Remarkably, despite at least 4 acute catheterizations, her LV function is essentially normal without regional wall motion abnormalities.  I had a very long discussion with her today in the office.  We discussed the importance of lifestyle adjustment.  We discussed also the importance of smoking cessation.  She had stopped using cocaine intermittently but unfortunately during the week times she had resumed use leading to recurrent events.  Presently, her blood pressure is well controlled on her current regimen.  She is on aspirin and Plavix.  I have suggested she undergo a P2 Y 12 testing to make certain she is Plavix responsive.  If she is not Plavix responsive she will then be need to switch to a different agent.  Apparently in the past she did have some intolerability to Brilinta.  She is on atorvastatin 80 mg.  Most recent lipid studies have shown a cholesterol 164, triglycerides 218, VLDL 44, LDL 79.  She tells me that she had not been using the atorvastatin continuously and that she will initiate this.  Otherwise I had recommended the initiation of concomitant Zetia 10 mg.  After a long discussion, she is very grateful to her care and hopefully she will be committed to lifestyle change.  She has had some issues with spinal stenosis which has limited her exercise.  I will see her back in the office in 3 months for reevaluation.   Medication Adjustments/Labs and Tests Ordered: Current medicines are reviewed at length with the patient today.  Concerns regarding medicines are outlined above.  Medication  changes, Labs and Tests ordered today are listed in the Patient Instructions below. Patient Instructions  Medication Instructions:  Your physician recommends that you continue on your current medications as directed. Please refer to the Current Medication list given to you today.  If you need a refill on your cardiac medications before your next appointment, please call your pharmacy.   Lab work: PLEASE GO TO [facility] FOR BLOOD WORK  Please return for FASTING labs prior to next appointment (CMET, Lipid)  Our in office lab hours are Monday-Friday 8:00-4:00, closed for lunch 12:45-1:45 pm.  No appointment needed  If you have labs (blood work) drawn today and your tests are completely normal, you will receive your results only by: Marland Kitchen MyChart Message (if you have MyChart) OR . A paper copy in the mail If you have any lab test that is abnormal or we need to change your treatment, we will call you to review the results.  Follow-Up: At [facility], you and your health needs are our priority.  As part of our continuing mission to provide you with exceptional heart care, we have created designated Provider Care Teams.  These Care Teams include your primary Cardiologist (physician) and Advanced Practice Providers (APPs -  Physician Assistants and Nurse Practitioners) who all work together to provide you with the care you need, when you need it. You will need a follow up appointment in 3-4 months.  Please call our office 2 months in advance to schedule this appointment.  You may see Shelva Majestic, MD (ONLY)      Signed, Shelva Majestic, MD  02/08/2018 10:07 PM    [facility] [address], [place], [place], [place]  [address]  Phone: [phone]

## 2018-05-07 ENCOUNTER — Other Ambulatory Visit: Payer: Self-pay

## 2018-05-15 ENCOUNTER — Other Ambulatory Visit (HOSPITAL_COMMUNITY)
Admission: RE | Admit: 2018-05-15 | Discharge: 2018-05-15 | Disposition: A | Discharge status: Home / Self Care | Payer: Self-pay | Source: Ambulatory Visit | Attending: Cardiovascular Disease | Admitting: Cardiovascular Disease

## 2018-05-31 ENCOUNTER — Telehealth: Payer: Self-pay | Admitting: Cardiovascular Disease

## 2018-05-31 MED ORDER — CLOPIDOGREL BISULFATE 75 MG PO TABS: 75.0000 mg | ORAL_TABLET | Freq: Every day | ORAL | 11 refills | Status: AC

## 2018-05-31 NOTE — Telephone Encounter (Signed)
Patient calling the office for samples of medication:   1.  What medication and dosage are you requesting samples for?clopidogrel (PLAVIX) 75 MG tablet  2.  Are you currently out of this medication? yes

## 2018-05-31 NOTE — Telephone Encounter (Signed)
Advised patient no samples but sent Rx to [facility] for patient to pick up.

## 2018-06-18 ENCOUNTER — Encounter: Payer: Self-pay | Admitting: Cardiovascular Disease

## 2018-06-18 ENCOUNTER — Ambulatory Visit (INDEPENDENT_AMBULATORY_CARE_PROVIDER_SITE_OTHER): Payer: Self-pay | Admitting: Cardiovascular Disease

## 2018-06-18 DIAGNOSIS — E785 Hyperlipidemia, unspecified: Secondary | ICD-10-CM

## 2018-06-18 DIAGNOSIS — I2510 Atherosclerotic heart disease of native coronary artery without angina pectoris: Secondary | ICD-10-CM

## 2018-06-18 DIAGNOSIS — I10 Essential (primary) hypertension: Secondary | ICD-10-CM

## 2018-06-18 DIAGNOSIS — Z720 Tobacco use: Secondary | ICD-10-CM

## 2018-06-18 DIAGNOSIS — Z79899 Other long term (current) drug therapy: Secondary | ICD-10-CM

## 2018-06-18 DIAGNOSIS — Z7902 Long term (current) use of antithrombotics/antiplatelets: Secondary | ICD-10-CM

## 2018-06-18 NOTE — Progress Notes (Signed)
Cardiology Office Note    Date:  06/20/2018   ID:  SULA FETTERLY, DOB 05-08-1960, MRN [id number]  PCP:  Patient, No Pcp Per  Cardiologist:  Shelva Majestic, MD   F/U office visit with me  History of Present Illness:  Natasha Roach is a 58 y.o. female who presents for follow-up office evaluation.  I had last seen her in October 2019.  Ms. Gilford Rile has a history of tobacco use, hypertension, diabetes mellitus and cocaine use.  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  I saw her on April 21, 2016 when she presented with 10 out of 10 chest pain shortly after using crack cocaine.  She was found to have acute ST segment elevation MI secondary to total occlusion of the proximal LAD with initial TIMI 0 flow and no evidence for collateralization.  She had normal circumflex and small right coronary artery.  She developed reperfusion induced ventricular tachycardia requiring cardioversion/defibrillation and amiodarone bolus.  Ultimately her LAD was successfully stented with a 3.5 x 20 mm DES stent postdilated 3.78 mm with resumption of TIMI-3 flow in a very large LAD vessel.  I had not seen the patient since that presentation.  Apparently, over the past several years she has had recurrent unfortunate issues with repeat chest pain.  She represented in July 2019 again presenting with severe chest pain following cocaine use.  She was found to have a thrombotic lesion in the LAD just proximal to her prior stent which was successfully intervened upon with insertion of a DES stent.  Underwent PCI to her LAD.  She ultimately signed out [place].  She represented again in September and underwent repeat catheterization on January 16, 2018 by Dr. Martinique.  Her LAD stent was patent but she had a 90% stenosis in the diagonal branch which was treated with PTCA and not stented.  An echo Doppler study from January 16, 2018 showed an EF of 50 to 55% without  regional wall motion abnormalities.  The aorta was mildly calcified with a questionable density in the aortic arch since her last event, she is now been on Plavix regularly previously had run out of her medication leading to recurrent events.  Since her most recent intervention, she has not experienced any recurrent chest pain.  Unfortunately she still smokes cigarettes.  She is on atorvastatin 80 mg for hyperlipidemia, aspirin and Plavix.    When I saw her on October 5 18th 2019 I had a very lengthy discussion with her and stressed the importance of significant lifestyle adjustment, smoking cessation, continuance of drugs, during that discussion she finally seem to understand the importance and as result states that she made a major commitment since that time to improve her life.  Fortunately she continues to smoke cigarettes.  She is made major strides in reducing her cocaine use she has changed her diet.  Nuys episodes of recurrent chest pain.  She is continue to take her medications and admits to 100% compliance with Plavix 75 mg in addition to aspirin 81 mg.  She is on atorvastatin 80 mg hyperlipidemia.  He denies recurrent symptoms of chest pain.  She is breathing better.  He is unaware of palpitations.  She presents for reevaluation.  Past Medical History:  Diagnosis Date  . CAD (coronary artery disease) 04/23/2016   a. suspected cocaine induced NSTEMI 12/2015; pt declined cath. b. STEMI 03/2016 due to cocaine, occ mLAD s/p Synergy  stent, EF 40% by cath, 55% by echo.  . Cocaine abuse ([place])   . H/O noncompliance with medical treatment, presenting hazards to health   . HTN (hypertension)   . Hyperlipidemia LDL goal <70   . Ischemic cardiomyopathy   . 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 Morbid obesity ([place])   . Prolonged QT interval 04/23/2016  . Substance abuse ([place])   . Tobacco abuse   . Ventricular tachycardia, sustained ([place])    a. at time of STEMI 03/2016.     Past Surgical History:  Procedure Laterality Date  . ABDOMINAL HYSTERECTOMY    . BREAST SURGERY     abcess  . CARDIAC CATHETERIZATION  2004  . CARDIAC CATHETERIZATION N/A 04/21/2016   Procedure: Left Heart Cath and Coronary Angiography;  Surgeon: Troy Sine, MD;  Location: [place];  Service: Cardiovascular;  Laterality: N/A;  . CARDIAC CATHETERIZATION N/A 04/21/2016   Procedure: Coronary Stent Intervention;  Surgeon: Troy Sine, MD;  Location: [place] [facility];  Service: Cardiovascular;  Laterality: N/A;  . CORONARY STENT INTERVENTION N/A 10/27/2017   Procedure: CORONARY STENT INTERVENTION;  Surgeon: Martinique, Peter M, MD;  Location: [place] [facility];  Service: Cardiovascular;  Laterality: N/A;  . CORONARY/GRAFT ACUTE MI REVASCULARIZATION N/A 10/27/2017   Procedure: Coronary/Graft Acute MI Revascularization;  Surgeon: Martinique, Peter M, MD;  Location: [facility];  Service: Cardiovascular;  Laterality: N/A;  . CORONARY/GRAFT ACUTE MI REVASCULARIZATION N/A 01/16/2018   Procedure: Coronary/Graft Acute MI Revascularization;  Surgeon: Martinique, Peter M, MD;  Location: [place];  Service: Cardiovascular;  Laterality: N/A;  . ELECTROPHYSIOLOGIC STUDY N/A 04/21/2016   Procedure: Cardioversion;  Surgeon: Troy Sine, MD;  Location: [facility];  Service: Cardiovascular;  Laterality: N/A;  . LEFT HEART CATH AND CORONARY ANGIOGRAPHY N/A 10/27/2017   Procedure: LEFT HEART CATH AND CORONARY ANGIOGRAPHY;  Surgeon: Martinique, Peter M, MD;  Location: [place] [facility];  Service: Cardiovascular;  Laterality: N/A;  . LEFT HEART CATH AND CORONARY ANGIOGRAPHY N/A 01/16/2018   Procedure: LEFT HEART CATH AND CORONARY ANGIOGRAPHY;  Surgeon: Martinique, Peter M, MD;  Location: [facility];  Service: Cardiovascular;  Laterality: N/A;    Current Medications: Outpatient Medications Prior to Visit  Medication Sig Dispense Refill  . aspirin 81 MG EC tablet Take 1 tablet (81  mg total) by mouth daily. 90 tablet 3  . atorvastatin (LIPITOR) 80 MG tablet Take 1 tablet (80 mg total) by mouth every evening. 30 tablet 11  . clopidogrel (PLAVIX) 75 MG tablet Take 1 tablet (75 mg total) by mouth daily. 30 tablet 11  . nitroGLYCERIN (NITROSTAT) 0.4 MG SL tablet Place 1 tablet (0.4 mg total) under the tongue every 5 (five) minutes x 3 doses as needed for chest pain. 25 tablet 11  . amoxicillin (AMOXIL) 500 MG capsule Take 1 capsule (500 mg total) by mouth 3 (three) times daily. 21 capsule 0   No facility-administered medications prior to visit.      Allergies:   Patient has no known allergies.   Social History   Socioeconomic History  . Marital status: Significant Other    Spouse name: Not on file  . Number of children: Not on file  . Years of education: Not on file  . Highest education level: Not on file  Occupational History  . Not on file  Social Needs  . Financial resource strain: Not hard  at all  . Food insecurity:    Worry: Never true    Inability: Never true  . Transportation needs:    Medical: Patient refused    Non-medical: Patient refused  Tobacco Use  . Smoking status: Current Every Day Smoker    Packs/day: 1.00    Years: 40.00    Pack years: 40.00    Types: Cigarettes  . Smokeless tobacco: Never Used  Substance and Sexual Activity  . Alcohol use: No  . Drug use: Yes    Types: Cocaine    Comment: last use 01/16/18  . Sexual activity: Yes  Lifestyle  . Physical activity:    Days per week: Patient refused    Minutes per session: Patient refused  . Stress: To some extent  Relationships  . Social connections:    Talks on phone: Patient refused    Gets together: Patient refused    Attends religious service: Patient refused    Active member of club or organization: Patient refused    Attends meetings of clubs or organizations: Patient refused    Relationship status: Patient refused  Other Topics Concern  . Not on file  Social History  Narrative  . Not on file     Family History:  The patient's  family history includes CAD in her father.   ROS General: Negative; No fevers, chills, or night sweats;  HEENT: Negative; No changes in vision or hearing, sinus congestion, difficulty swallowing Pulmonary: Negative; No cough, wheezing, shortness of breath, hemoptysis Cardiovascular: Negative; No chest pain, presyncope, syncope, palpitations GI: Negative; No nausea, vomiting, diarrhea, or abdominal pain GU: Negative; No dysuria, hematuria, or difficulty voiding Musculoskeletal: Negative; no myalgias, joint pain, or weakness Hematologic/Oncology: Negative; no easy bruising, bleeding Endocrine: Negative; no heat/cold intolerance; no diabetes Neuro: Negative; no changes in balance, headaches Skin: Negative; No rashes or skin lesions Psychiatric: Negative; No behavioral problems, depression Sleep: Negative; No snoring, daytime sleepiness, hypersomnolence, bruxism, restless legs, hypnogognic hallucinations, no cataplexy Other comprehensive 14 point system review is negative.   PHYSICAL EXAM:   VS:  BP 112/72   Pulse (!) 59   Ht 5' 7.5" (1.715 m)   Wt 186 lb 11.2 oz (84.7 kg)   BMI 28.81 kg/m     Repeat blood pressure by me was 120/70 without orthostatic change  Wt Readings from Last 3 Encounters:  06/18/18 186 lb 11.2 oz (84.7 kg)  02/06/18 173 lb (78.5 kg)  02/02/18 175 lb (79.4 kg)    General: Alert, oriented, no distress.  Skin: normal turgor, no rashes, warm and dry HEENT: Normocephalic, atraumatic. Pupils equal round and reactive to light; sclera anicteric; extraocular muscles intact;  Nose without nasal septal hypertrophy Mouth/Parynx benign; Mallinpatti scale 3 Neck: No JVD, no carotid bruits; normal carotid upstroke Lungs: clear to ausculatation and percussion; no wheezing or rales Chest wall: without tenderness to palpitation Heart: PMI not displaced, RRR, s1 s2 normal, 1/6 systolic murmur, no diastolic  murmur, no rubs, gallops, thrills, or heaves Abdomen: soft, nontender; no hepatosplenomehaly, BS+; abdominal aorta nontender and not dilated by palpation. Back: no CVA tenderness Pulses 2+ Musculoskeletal: full range of motion, normal strength, no joint deformities Extremities: no clubbing cyanosis or edema, Homan's sign negative  Neurologic: grossly nonfocal; Cranial nerves grossly wnl Psychologic: Normal mood and affect   Studies/Labs Reviewed:   EKG:  EKG is ordered today.  ECG (independently read by me):Sinus Bradycardia 59; Mild T wave abnormality inferolaterally; Nl intervals  February 06, 2018 ECG (independently read by  me): Normal sinus rhythm at 74 bpm.  Mild T wave abnormality inferolaterally  Recent Labs: BMP Latest Ref Rng & Units 06/18/2018 01/17/2018 01/16/2018  Glucose 65 - 99 mg/dL 109(H) 124(H) 208(H)  BUN 6 - 24 mg/dL '10 10 11  '$ Creatinine 0.57 - 1.00 mg/dL 0.64 0.85 0.60  BUN/Creat Ratio 9 - 23 16 - -  Sodium 134 - 144 mmol/L 144 139 139  Potassium 3.5 - 5.2 mmol/L 4.8 4.1 4.0  Chloride 96 - 106 mmol/L 108(H) 109 107  CO2 20 - 29 mmol/L 22 19(L) -  Calcium 8.7 - 10.2 mg/dL 9.3 8.7(L) -     Hepatic Function Latest Ref Rng & Units 06/18/2018 01/16/2018 10/27/2017  Total Protein 6.0 - 8.5 g/dL 5.8(L) 5.1(L) 4.8(L)  Albumin 3.8 - 4.9 g/dL 4.2 3.1(L) 3.2(L)  AST 0 - 40 IU/L '15 16 15  '$ ALT 0 - 32 IU/L '20 18 15  '$ Alk Phosphatase 39 - 117 IU/L 113 74 86  Total Bilirubin 0.0 - 1.2 mg/dL 0.4 0.7 0.6  Bilirubin, Direct 0.1 - 0.5 mg/dL - - -    CBC Latest Ref Rng & Units 06/18/2018 01/17/2018 01/16/2018  WBC 3.4 - 10.8 x10E3/uL 13.5(H) 16.6(H) -  Hemoglobin 11.1 - 15.9 g/dL 14.6 14.2 15.3(H)  Hematocrit 34.0 - 46.6 % 43.9 45.3 45.0  Platelets 150 - 450 x10E3/uL 266 229 -   Lab Results  Component Value Date   MCV 84 06/18/2018   MCV 85.2 01/17/2018   MCV 86.1 01/16/2018   Lab Results  Component Value Date   TSH 0.963 06/18/2018   Lab Results  Component Value Date    HGBA1C 6.9 (H) 01/16/2018     BNP No results found for: BNP  ProBNP No results found for: PROBNP   Lipid Panel     Component Value Date/Time   CHOL 135 06/18/2018 1144   TRIG 150 (H) 06/18/2018 1144   HDL 49 06/18/2018 1144   CHOLHDL 2.8 06/18/2018 1144   CHOLHDL 4.0 01/17/2018 0212   VLDL 44 (H) 01/17/2018 0212   LDLCALC 56 06/18/2018 1144     RADIOLOGY: No results found.   Additional studies/ records that were reviewed today include:  Reviewed the patient's hospitalizations, cardiac catheterizations and interventions, laboratory, echo Doppler and CT assessments.  ASSESSMENT:    1. Coronary artery disease involving native coronary artery of native heart without angina pectoris   2. Long term (current) use of antithrombotics/antiplatelets   3. Hyperlipidemia LDL goal <70   4. Essential hypertension   5. Medication management   6. Tobacco abuse     PLAN:  Ms. Lilit Cinelli is a 58 year old African-American female who has a long-standing history of prior crack and cocaine use.  This has resulted in recurrent acute coronary syndrome events contributed by cocaine induced vasospasm.  Remarkably, despite at least 4 acute catheterizations, her LV function is essentially normal without regional wall motion abnormalities.  When I saw her in October 2019 after not having seen her since 2017 I spent considerable time with her and discussed the absolute importance of major lifestyle change.  During that evaluation she became very tearful ultimately believes that that conversation has made a major impact in her significant lifestyle change moving forward.  She now admits to 100% compliance with her medicines.  Unfortunately she is still smoking tobacco.  She essentially his given up all crack and cocaine and I stressed 100% compliance is necessary.  Blood pressure today is stable.  Laboratory in September 2019  showed an LDL cholesterol at 79.  She is now on atorvastatin 80 mg.   Her echo Doppler study in September 2019 showed an EF of 50 to 55% with normal wall motion and without regional wall motion abnormalities.  Will require repeat laboratory to assess improved efficacy with compliance with atorvastatin guarding lipid studies.  Continues to have some issues with her spinal stenosis which limits her exercise.  We again discussed the importance of tobacco cessation.  I will see her in 6 months for reevaluation.  Time spent: 25 minutes  Medication Adjustments/Labs and Tests Ordered: Current medicines are reviewed at length with the patient today.  Concerns regarding medicines are outlined above.  Medication changes, Labs and Tests ordered today are listed in the Patient Instructions below. Patient Instructions  Medication Instructions:  The current medical regimen is effective;  continue present plan and medications.  If you need a refill on your cardiac medications before your next appointment, please call your pharmacy.   Lab work: Fasting lab work today (CMET, TSH, CBC, LIPID) If you have labs (blood work) drawn today and your tests are completely normal, you will receive your results only by: Marland Kitchen MyChart Message (if you have MyChart) OR . A paper copy in the mail If you have any lab test that is abnormal or we need to change your treatment, we will call you to review the results.   Follow-Up: At [facility], you and your health needs are our priority.  As part of our continuing mission to provide you with exceptional heart care, we have created designated Provider Care Teams.  These Care Teams include your primary Cardiologist (physician) and Advanced Practice Providers (APPs -  Physician Assistants and Nurse Practitioners) who all work together to provide you with the care you need, when you need it. . Follow up with Dr.Colen Eltzroth in 6 months.        Signed, Shelva Majestic, MD  06/20/2018 1:16 PM    [facility] [address]  [place], [place], [place]  [address] Phone: [phone]

## 2018-06-18 NOTE — Patient Instructions (Signed)
Medication Instructions:  The current medical regimen is effective;  continue present plan and medications.  If you need a refill on your cardiac medications before your next appointment, please call your pharmacy.   Lab work: Fasting lab work today (CMET, TSH, CBC, LIPID) If you have labs (blood work) drawn today and your tests are completely normal, you will receive your results only by: Marland Kitchen MyChart Message (if you have MyChart) OR . A paper copy in the mail If you have any lab test that is abnormal or we need to change your treatment, we will call you to review the results.   Follow-Up: At [facility], you and your health needs are our priority.  As part of our continuing mission to provide you with exceptional heart care, we have created designated Provider Care Teams.  These Care Teams include your primary Cardiologist (physician) and Advanced Practice Providers (APPs -  Physician Assistants and Nurse Practitioners) who all work together to provide you with the care you need, when you need it. . Follow up with Dr.Kelly in 6 months.

## 2018-06-19 LAB — LIPID PANEL
CHOLESTEROL TOTAL: 135 mg/dL (ref 100–199)
Chol/HDL Ratio: 2.8 ratio (ref 0.0–4.4)
HDL: 49 mg/dL (ref >39)
LDL CALC: 56 mg/dL (ref 0–99)
Triglycerides: 150 mg/dL — ABNORMAL HIGH (ref 0–149)
VLDL Cholesterol Cal: 30 mg/dL (ref 5–40)

## 2018-06-19 LAB — COMPREHENSIVE METABOLIC PANEL
A/G RATIO: 2.6 — AB (ref 1.2–2.2)
ALBUMIN: 4.2 g/dL (ref 3.8–4.9)
ALT: 20 IU/L (ref 0–32)
AST: 15 IU/L (ref 0–40)
Alkaline Phosphatase: 113 IU/L (ref 39–117)
BUN/Creatinine Ratio: 16 (ref 9–23)
BUN: 10 mg/dL (ref 6–24)
Bilirubin Total: 0.4 mg/dL (ref 0.0–1.2)
CALCIUM: 9.3 mg/dL (ref 8.7–10.2)
CO2: 22 mmol/L (ref 20–29)
Chloride: 108 mmol/L — ABNORMAL HIGH (ref 96–106)
Creatinine, Ser: 0.64 mg/dL (ref 0.57–1.00)
GFR, EST AFRICAN AMERICAN: 115 mL/min/{1.73_m2} (ref >59)
GFR, EST NON AFRICAN AMERICAN: 100 mL/min/{1.73_m2} (ref >59)
GLOBULIN, TOTAL: 1.6 g/dL (ref 1.5–4.5)
Glucose: 109 mg/dL — ABNORMAL HIGH (ref 65–99)
POTASSIUM: 4.8 mmol/L (ref 3.5–5.2)
SODIUM: 144 mmol/L (ref 134–144)
TOTAL PROTEIN: 5.8 g/dL — AB (ref 6.0–8.5)

## 2018-06-19 LAB — TSH: TSH: 0.963 u[IU]/mL (ref 0.450–4.500)

## 2018-06-19 LAB — CBC
HEMOGLOBIN: 14.6 g/dL (ref 11.1–15.9)
Hematocrit: 43.9 % (ref 34.0–46.6)
MCH: 27.8 pg (ref 26.6–33.0)
MCHC: 33.3 g/dL (ref 31.5–35.7)
MCV: 84 fL (ref 79–97)
PLATELETS: 266 10*3/uL (ref 150–450)
RBC: 5.26 x10E6/uL (ref 3.77–5.28)
RDW: 12.6 % (ref 11.7–15.4)
WBC: 13.5 10*3/uL — ABNORMAL HIGH (ref 3.4–10.8)

## 2018-06-20 ENCOUNTER — Encounter: Payer: Self-pay | Admitting: Cardiovascular Disease

## 2019-01-31 ENCOUNTER — Other Ambulatory Visit: Payer: Self-pay

## 2019-01-31 ENCOUNTER — Ambulatory Visit (INDEPENDENT_AMBULATORY_CARE_PROVIDER_SITE_OTHER): Payer: Self-pay | Admitting: Cardiovascular Disease

## 2019-01-31 DIAGNOSIS — Z87898 Personal history of other specified conditions: Secondary | ICD-10-CM

## 2019-01-31 DIAGNOSIS — I2510 Atherosclerotic heart disease of native coronary artery without angina pectoris: Secondary | ICD-10-CM

## 2019-01-31 DIAGNOSIS — Z720 Tobacco use: Secondary | ICD-10-CM

## 2019-01-31 DIAGNOSIS — I10 Essential (primary) hypertension: Secondary | ICD-10-CM

## 2019-01-31 DIAGNOSIS — I249 Acute ischemic heart disease, unspecified: Secondary | ICD-10-CM

## 2019-01-31 DIAGNOSIS — E785 Hyperlipidemia, unspecified: Secondary | ICD-10-CM

## 2019-01-31 NOTE — Patient Instructions (Signed)
Follow-Up: You will need a follow up appointment in 12 months.  Please call our office 2 months in advance, AUGUST 2021 to schedule this, October 2021 appointment.  You may see Thomas Kelly, MD or one of the following Advanced Practice Providers on your designated Care Team:  Hao Meng, PA-C  Angela Duke, PA-C     Medication Instructions:  The current medical regimen is effective;  continue present plan and medications as directed. Please refer to the Current Medication list given to you today. If you need a refill on your cardiac medications before your next appointment, please call your pharmacy. Labwork: When you have labs (blood work) and your tests are completely normal, you will receive your results ONLY by MyChart Message (if you have MyChart) -OR- A paper copy in the mail.  At [facility], you and your health needs are our priority.  As part of our continuing mission to provide you with exceptional heart care, we have created designated Provider Care Teams.  These Care Teams include your primary Cardiologist (physician) and Advanced Practice Providers (APPs -  Physician Assistants and Nurse Practitioners) who all work together to provide you with the care you need, when you need it.  Thank you for choosing [facility] at [facility]!!       

## 2019-01-31 NOTE — Progress Notes (Signed)
Cardiology Office Note    Date:  02/02/2019   ID:  Natasha Roach, DOB [birth year], MRN [id number]  PCP:  Marliss Coots, NP  Cardiologist:  Shelva Majestic, MD   F/U office visit with me  History of Present Illness:  Natasha Roach is a 58 y.o. female who presents for an 8 month follow-up office evaluation.    Natasha Roach has a history of tobacco use, hypertension, diabetes mellitus and cocaine use.  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  I saw her on April 21, 2016 when she presented with 10 out of 10 chest pain shortly after using crack cocaine.  She was found to have acute ST segment elevation MI secondary to total occlusion of the proximal LAD with initial TIMI 0 flow and no evidence for collateralization.  She had normal circumflex and small right coronary artery.  She developed reperfusion induced ventricular tachycardia requiring cardioversion/defibrillation and amiodarone bolus.  Ultimately her LAD was successfully stented with a 3.5 x 20 mm DES stent postdilated 3.78 mm with resumption of TIMI-3 flow in a very large LAD vessel.  I had not seen the patient since that presentation.  Apparently, over the past several years she had recurrent unfortunate issues with repeat chest pain.  She represented in July 2019 again presenting with severe chest pain following cocaine use.  She was found to have a thrombotic lesion in the LAD just proximal to her prior stent which was successfully intervened upon with insertion of a DES stent.  Underwent PCI to her LAD.  She ultimately signed out [place].  She represented again in September and underwent repeat catheterization on January 16, 2018 by Dr. Martinique.  Her LAD stent was patent but she had a 90% stenosis in the diagonal branch which was treated with PTCA and not stented.  An echo Doppler study from January 16, 2018 showed an EF of 50 to 55% without regional wall motion  abnormalities.  The aorta was mildly calcified with a questionable density in the aortic arch since her last event, she is now been on Plavix regularly previously had run out of her medication leading to recurrent events.  Since her most recent intervention, she has not experienced any recurrent chest pain.  Unfortunately she still smokes cigarettes.  She is on atorvastatin 80 mg for hyperlipidemia, aspirin and Plavix.    When I saw her on October 5 18th 2019 I had a very lengthy discussion with her and stressed the importance of significant lifestyle adjustment, smoking cessation, discontinuance of drugs, during that discussion she finally seem to understand the importance and made a major effort in improving her lifestyle.   When I last saw her in February 2020, she was extremely gratified for the time that I had spent with her and became very tearful that that discussion was of major impact in her significant lifestyle adjustment.  She had dramatically reduced drug use but unfortunately still continues to smoke.  She was 100% percent compliant with taking her medications.  Her echo Doppler study in September 2019 showed an EF of 50 to 55% with normal wall motion and was without any regional wall motion abnormalities suggesting complete salvage of myocardium despite her prior event.  She continued to have some issues with her spinal stenosis which limited her exercise.  Since I last saw her, she remains very grateful and has continued to remain fairly stable from a  cardiovascular standpoint.  [facility]  The patient denies any recent drug use.  She continues to be on aspirin and Plavix as well as atorvastatin 80 mg daily.  She is not having any anginal symptoms or exertional dyspnea.  She presents for evaluation.  Past Medical History:  Diagnosis Date  . CAD (coronary artery disease) 04/23/2016   a. suspected cocaine induced NSTEMI  12/2015; pt declined cath. b. STEMI 03/2016 due to cocaine, occ mLAD s/p Synergy stent, EF 40% by cath, 55% by echo.  . Cocaine abuse ([place])   . H/O noncompliance with medical treatment, presenting hazards to health   . HTN (hypertension)   . Hyperlipidemia LDL goal <70   . Ischemic cardiomyopathy   . 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 Morbid obesity ([place])   . Prolonged QT interval 04/23/2016  . Substance abuse ([place])   . Tobacco abuse   . Ventricular tachycardia, sustained ([place])    a. at time of STEMI 03/2016.    Past Surgical History:  Procedure Laterality Date  . ABDOMINAL HYSTERECTOMY    . BREAST SURGERY     abcess  . CARDIAC CATHETERIZATION  2004  . CARDIAC CATHETERIZATION N/A 04/21/2016   Procedure: Left Heart Cath and Coronary Angiography;  Surgeon: Troy Sine, MD;  Location: [facility];  Service: Cardiovascular;  Laterality: N/A;  . CARDIAC CATHETERIZATION N/A 04/21/2016   Procedure: Coronary Stent Intervention;  Surgeon: Troy Sine, MD;  Location: [facility];  Service: Cardiovascular;  Laterality: N/A;  . CORONARY STENT INTERVENTION N/A 10/27/2017   Procedure: CORONARY STENT INTERVENTION;  Surgeon: Martinique, Peter M, MD;  Location: [facility];  Service: Cardiovascular;  Laterality: N/A;  . CORONARY/GRAFT ACUTE MI REVASCULARIZATION N/A 10/27/2017   Procedure: Coronary/Graft Acute MI Revascularization;  Surgeon: Martinique, Peter M, MD;  Location: [facility];  Service: Cardiovascular;  Laterality: N/A;  . CORONARY/GRAFT ACUTE MI REVASCULARIZATION N/A 01/16/2018   Procedure: Coronary/Graft Acute MI Revascularization;  Surgeon: Martinique, Peter M, MD;  Location: [facility];  Service: Cardiovascular;  Laterality: N/A;  . ELECTROPHYSIOLOGIC STUDY N/A 04/21/2016   Procedure: Cardioversion;  Surgeon: Troy Sine, MD;  Location: [facility];  Service: Cardiovascular;  Laterality: N/A;  . LEFT HEART CATH AND CORONARY  ANGIOGRAPHY N/A 10/27/2017   Procedure: LEFT HEART CATH AND CORONARY ANGIOGRAPHY;  Surgeon: Martinique, Peter M, MD;  Location: [facility];  Service: Cardiovascular;  Laterality: N/A;  . LEFT HEART CATH AND CORONARY ANGIOGRAPHY N/A 01/16/2018   Procedure: LEFT HEART CATH AND CORONARY ANGIOGRAPHY;  Surgeon: Martinique, Peter M, MD;  Location: [facility];  Service: Cardiovascular;  Laterality: N/A;    Current Medications: Outpatient Medications Prior to Visit  Medication Sig Dispense Refill  . aspirin 81 MG EC tablet Take 1 tablet (81 mg total) by mouth daily. 90 tablet 3  . atorvastatin (LIPITOR) 80 MG tablet Take 1 tablet (80 mg total) by mouth every evening. 30 tablet 11  . clopidogrel (PLAVIX) 75 MG tablet Take 1 tablet (75 mg total) by mouth daily. 30 tablet 11  . nitroGLYCERIN (NITROSTAT) 0.4 MG SL tablet Place 1 tablet (0.4 mg total) under the tongue every 5 (five) minutes x 3 doses as needed for chest pain. 25 tablet 11   No facility-administered medications prior to visit.      Allergies:  Patient has no known allergies.   Social History   Socioeconomic History  . Marital status: Significant Other    Spouse name: Not on file  . Number of children: Not on file  . Years of education: Not on file  . Highest education level: Not on file  Occupational History  . Not on file  Social Needs  . Financial resource strain: Not hard at all  . Food insecurity    Worry: Never true    Inability: Never true  . Transportation needs    Medical: Patient refused    Non-medical: Patient refused  Tobacco Use  . Smoking status: Current Every Day Smoker    Packs/day: 1.00    Years: 40.00    Pack years: 40.00    Types: Cigarettes  . Smokeless tobacco: Never Used  Substance and Sexual Activity  . Alcohol use: No  . Drug use: Yes    Types: Cocaine    Comment: last use 01/16/18  . Sexual activity: Yes  Lifestyle  . Physical activity    Days per week: Patient refused    Minutes  per session: Patient refused  . Stress: To some extent  Relationships  . Social Herbalist on phone: Patient refused    Gets together: Patient refused    Attends religious service: Patient refused    Active member of club or organization: Patient refused    Attends meetings of clubs or organizations: Patient refused    Relationship status: Patient refused  Other Topics Concern  . Not on file  Social History Narrative  . Not on file     Family History:  The patient's  family history includes CAD in her father.   ROS General: Negative; No fevers, chills, or night sweats;  HEENT: Negative; No changes in vision or hearing, sinus congestion, difficulty swallowing Pulmonary: Negative; No cough, wheezing, shortness of breath, hemoptysis Cardiovascular: Negative; No chest pain, presyncope, syncope, palpitations GI: Negative; No nausea, vomiting, diarrhea, or abdominal pain GU: Negative; No dysuria, hematuria, or difficulty voiding Musculoskeletal: Negative; no myalgias, joint pain, or weakness Hematologic/Oncology: Negative; no easy bruising, bleeding Endocrine: Negative; no heat/cold intolerance; no diabetes Neuro: Negative; no changes in balance, headaches Skin: Negative; No rashes or skin lesions Psychiatric: Negative; No behavioral problems, depression Sleep: Negative; No snoring, daytime sleepiness, hypersomnolence, bruxism, restless legs, hypnogognic hallucinations, no cataplexy Other comprehensive 14 point system review is negative.   PHYSICAL EXAM:   VS:  BP (!) 143/96   Pulse 60   Ht 5' 7.5" (1.715 m)   Wt 189 lb (85.7 kg)   SpO2 98%   BMI 29.16 kg/m     Repeat blood pressure by me was 128/82.  There was no orthostatic change.  Wt Readings from Last 3 Encounters:  01/31/19 189 lb (85.7 kg)  06/18/18 186 lb 11.2 oz (84.7 kg)  02/06/18 173 lb (78.5 kg)     General: Alert, oriented, no distress.  Skin: normal turgor, no rashes, warm and dry HEENT:  Normocephalic, atraumatic. Pupils equal round and reactive to light; sclera anicteric; extraocular muscles intact;  Nose without nasal septal hypertrophy Mouth/Parynx benign; Mallinpatti scale 3 Neck: No JVD, no carotid bruits; normal carotid upstroke Lungs: clear to ausculatation and percussion; no wheezing or rales Chest wall: without tenderness to palpitation Heart: PMI not displaced, RRR, s1 s2 normal, 1/6 systolic murmur, no diastolic murmur, no rubs, gallops, thrills, or heaves Abdomen: soft, nontender; no hepatosplenomehaly, BS+; abdominal aorta nontender and not dilated  by palpation. Back: no CVA tenderness Pulses 2+ Musculoskeletal: full range of motion, normal strength, no joint deformities Extremities: no clubbing cyanosis or edema, Homan's sign negative  Neurologic: grossly nonfocal; Cranial nerves grossly wnl Psychologic: Normal mood and affect   Studies/Labs Reviewed:   EKG:  EKG is ordered today.  ECG (independently read by me): Sinus rhythm at 60 bpm.  No ectopy.  Normal intervals.  Minimal RV conduction delay  February 2020 ECG (independently read by me):Sinus Bradycardia 59; Mild T wave abnormality inferolaterally; Nl intervals  February 06, 2018 ECG (independently read by me): Normal sinus rhythm at 74 bpm.  Mild T wave abnormality inferolaterally  Recent Labs: BMP Latest Ref Rng & Units 06/18/2018 01/17/2018 01/16/2018  Glucose 65 - 99 mg/dL 109(H) 124(H) 208(H)  BUN 6 - 24 mg/dL '10 10 11  '$ Creatinine 0.57 - 1.00 mg/dL 0.64 0.85 0.60  BUN/Creat Ratio 9 - 23 16 - -  Sodium 134 - 144 mmol/L 144 139 139  Potassium 3.5 - 5.2 mmol/L 4.8 4.1 4.0  Chloride 96 - 106 mmol/L 108(H) 109 107  CO2 20 - 29 mmol/L 22 19(L) -  Calcium 8.7 - 10.2 mg/dL 9.3 8.7(L) -     Hepatic Function Latest Ref Rng & Units 06/18/2018 01/16/2018 10/27/2017  Total Protein 6.0 - 8.5 g/dL 5.8(L) 5.1(L) 4.8(L)  Albumin 3.8 - 4.9 g/dL 4.2 3.1(L) 3.2(L)  AST 0 - 40 IU/L '15 16 15  '$ ALT 0 - 32 IU/L '20 18  15  '$ Alk Phosphatase 39 - 117 IU/L 113 74 86  Total Bilirubin 0.0 - 1.2 mg/dL 0.4 0.7 0.6  Bilirubin, Direct 0.1 - 0.5 mg/dL - - -    CBC Latest Ref Rng & Units 06/18/2018 01/17/2018 01/16/2018  WBC 3.4 - 10.8 x10E3/uL 13.5(H) 16.6(H) -  Hemoglobin 11.1 - 15.9 g/dL 14.6 14.2 15.3(H)  Hematocrit 34.0 - 46.6 % 43.9 45.3 45.0  Platelets 150 - 450 x10E3/uL 266 229 -   Lab Results  Component Value Date   MCV 84 06/18/2018   MCV 85.2 01/17/2018   MCV 86.1 01/16/2018   Lab Results  Component Value Date   TSH 0.963 06/18/2018   Lab Results  Component Value Date   HGBA1C 6.9 (H) 01/16/2018     BNP No results found for: BNP  ProBNP No results found for: PROBNP   Lipid Panel     Component Value Date/Time   CHOL 135 06/18/2018 1144   TRIG 150 (H) 06/18/2018 1144   HDL 49 06/18/2018 1144   CHOLHDL 2.8 06/18/2018 1144   CHOLHDL 4.0 01/17/2018 0212   VLDL 44 (H) 01/17/2018 0212   LDLCALC 56 06/18/2018 1144     RADIOLOGY: No results found.   Additional studies/ records that were reviewed today include:  Reviewed the patient's hospitalizations, cardiac catheterizations and interventions, laboratory, echo Doppler and CT assessments.  ASSESSMENT:    1. CAD in native artery   2. Hyperlipidemia LDL goal <70   3. Hypertension, unspecified type   4. Tobacco abuse   5. History of crack cocaine use   6. 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  Remarkably, despite at least 4 acute catheterizations, her LV function is essentially normal without regional wall motion abnormalities.  When I saw her in October 2019 after not having seen her since 2017 I spent considerable time with her and discussed  the absolute importance of major lifestyle change.  During that evaluation, she seemed to "connect "with me and became very tearful and states that that discussion completely changed her life.  Subsequently, she has essentially stopped using crack and cocaine.  She has continued to smoke cigarettes and has for over 40 years.  She is trying to reduce this amount and ultimately discontinue this.  She is not having any anginal symptomatology.  With her recurrent events in the past, I had recommended continuation of long-term DAPT therapy and she continues to be on aspirin/Plavix without recurrence pathology.  [facility] is seeing Dr. Audrea Muscat Placey.  I again discussed the importance of exercise, proper diet and avoidance of drug use.  I reviewed laboratory from June 18, 2018.  Total cholesterol was 135, LDL 56.  TSH was normal at 0.963.  Renal function was stable with a BUN of 10 and creatinine 0.64.  LFTs were normal.  Repeat laboratory will be checked by her primary provider.  As long as she remains stable, I will see her in 1 year for follow-up evaluation.  Time spent: 25 minutes  Medication Adjustments/Labs and Tests Ordered: Current medicines are reviewed at length with the patient today.  Concerns regarding medicines are outlined above.  Medication changes, Labs and Tests ordered today are listed in the Patient Instructions below. Patient Instructions  Follow-Up: You will need a follow up appointment in 12 months.  Please call our office 2 months in advance, AUGUST 2021 to schedule this, October 2021 appointment.  You may see Shelva Majestic, MD or one of the following Advanced Practice Providers on your designated Care Team:  Almyra Deforest, PA-C  Fabian Sharp, [place]    Medication Instructions:  The current medical regimen is effective;  continue present plan and medications as directed. Please refer to the Current Medication list given to you today. If  you need a refill on your cardiac medications before your next appointment, please call your pharmacy. Labwork: When you have labs (blood work) and your tests are completely normal, you will receive your results ONLY by [facility] (if you have MyChart) -OR- A paper copy in the mail.  At [facility], you and your health needs are our priority.  As part of our continuing mission to provide you with exceptional heart care, we have created designated Provider Care Teams.  These Care Teams include your primary Cardiologist (physician) and Advanced Practice Providers (APPs -  Physician Assistants and Nurse Practitioners) who all work together to provide you with the care you need, when you need it.  Thank you for choosing [facility] at [facility]!!          Signed, Shelva Majestic, MD  02/02/2019 12:40 PM    [facility] [address], [place], [place], [place]  [address] Phone: [phone]

## 2019-02-02 ENCOUNTER — Encounter: Payer: Self-pay | Admitting: Cardiovascular Disease

## 2020-02-18 ENCOUNTER — Ambulatory Visit: Payer: Self-pay | Admitting: Cardiovascular Disease

## 2020-02-24 ENCOUNTER — Ambulatory Visit: Payer: Self-pay | Admitting: Cardiovascular Disease

## 2020-03-11 ENCOUNTER — Ambulatory Visit (INDEPENDENT_AMBULATORY_CARE_PROVIDER_SITE_OTHER): Payer: Self-pay | Admitting: Cardiovascular Disease

## 2020-03-11 ENCOUNTER — Other Ambulatory Visit: Payer: Self-pay

## 2020-03-11 ENCOUNTER — Encounter: Payer: Self-pay | Admitting: Cardiovascular Disease

## 2020-03-11 DIAGNOSIS — Z7902 Long term (current) use of antithrombotics/antiplatelets: Secondary | ICD-10-CM

## 2020-03-11 DIAGNOSIS — Z87898 Personal history of other specified conditions: Secondary | ICD-10-CM

## 2020-03-11 DIAGNOSIS — I10 Essential (primary) hypertension: Secondary | ICD-10-CM

## 2020-03-11 DIAGNOSIS — E785 Hyperlipidemia, unspecified: Secondary | ICD-10-CM

## 2020-03-11 DIAGNOSIS — Z720 Tobacco use: Secondary | ICD-10-CM

## 2020-03-11 DIAGNOSIS — I2510 Atherosclerotic heart disease of native coronary artery without angina pectoris: Secondary | ICD-10-CM

## 2020-03-11 NOTE — Progress Notes (Signed)
Cardiology Office Note    Date:  03/11/2020   ID:  Natasha Roach, DOB [birth year], MRN [id number]  PCP:  Marliss Coots, NP  Cardiologist:  Shelva Majestic, MD   F/U office visit with me  History of Present Illness:  Natasha Roach is a 59 y.o. female who presents for an 8 month follow-up office evaluation.    Natasha Roach has a history of tobacco use, hypertension, diabetes mellitus and cocaine use.  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  I saw her on April 21, 2016 when she presented with 10 out of 10 chest pain shortly after using crack cocaine.  She was found to have acute ST segment elevation MI secondary to total occlusion of the proximal LAD with initial TIMI 0 flow and no evidence for collateralization.  She had normal circumflex and small right coronary artery.  She developed reperfusion induced ventricular tachycardia requiring cardioversion/defibrillation and amiodarone bolus.  Ultimately her LAD was successfully stented with a 3.5 x 20 mm DES stent postdilated 3.78 mm with resumption of TIMI-3 flow in a very large LAD vessel.  I had not seen the patient since that presentation.  Apparently, over the past several years she had recurrent unfortunate issues with repeat chest pain.  She represented in July 2019 again presenting with severe chest pain following cocaine use.  She was found to have a thrombotic lesion in the LAD just proximal to her prior stent which was successfully intervened upon with insertion of a DES stent.  Underwent PCI to her LAD.  She ultimately signed out [place].  She represented again in September and underwent repeat catheterization on January 16, 2018 by Dr. Martinique.  Her LAD stent was patent but she had a 90% stenosis in the diagonal branch which was treated with PTCA and not stented.  An echo Doppler study from January 16, 2018 showed an EF of 50 to 55% without regional wall motion  abnormalities.  The aorta was mildly calcified with a questionable density in the aortic arch since her last event, she is now been on Plavix regularly previously had run out of her medication leading to recurrent events.  Since her most recent intervention, she has not experienced any recurrent chest pain.  Unfortunately she still smokes cigarettes.  She is on atorvastatin 80 mg for hyperlipidemia, aspirin and Plavix.    When I saw her on October 5 18th 2019 I had a very lengthy discussion with her and stressed the importance of significant lifestyle adjustment, smoking cessation, discontinuance of drugs, during that discussion she finally seem to understand the importance and made a major effort in improving her lifestyle.   When I saw her in February 2020, she was extremely gratified for the time that I had spent with her and became very tearful that that discussion was of major impact in her significant lifestyle adjustment.  She had dramatically reduced drug use but unfortunately still continues to smoke.  She was 100% percent compliant with taking her medications.  Her echo Doppler study in September 2019 showed an EF of 50 to 55% with normal wall motion and was without any regional wall motion abnormalities suggesting complete salvage of myocardium despite her prior event.  She continued to have some issues with her spinal stenosis which limited her exercise.  I last saw her in October 2020 at which time she continued to be very grateful and remained stable  from a cardiovascular standpoint.  [facility]  The patient denies any recent drug use.  She continues to be on aspirin and Plavix as well as atorvastatin 80 mg daily.  She was not having any anginal symptoms or exertional dyspnea.    Over the past year, she has continued to do well.  Unfortunately she still smokes cigarettes approximately 1 -1 1/2 pack/day.  She has continued to be  grateful but over the past year there have been times where she continues to use cocaine intermittently.  Previously she had used it on a daily basis.  She denies chest pain PND orthopnea.  She tells me she has not been taking atorvastatin.  She presents for yearly evaluation.  Past Medical History:  Diagnosis Date  . CAD (coronary artery disease) 04/23/2016   a. suspected cocaine induced NSTEMI 12/2015; pt declined cath. b. STEMI 03/2016 due to cocaine, occ mLAD s/p Synergy stent, EF 40% by cath, 55% by echo.  . Cocaine abuse ([place])   . H/O noncompliance with medical treatment, presenting hazards to health   . HTN (hypertension)   . Hyperlipidemia LDL goal <70   . Ischemic cardiomyopathy   . 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 Morbid obesity ([place])   . Prolonged QT interval 04/23/2016  . Substance abuse ([place])   . Tobacco abuse   . Ventricular tachycardia, sustained ([place])    a. at time of STEMI 03/2016.    Past Surgical History:  Procedure Laterality Date  . ABDOMINAL HYSTERECTOMY    . BREAST SURGERY     abcess  . CARDIAC CATHETERIZATION  2004  . CARDIAC CATHETERIZATION N/A 04/21/2016   Procedure: Left Heart Cath and Coronary Angiography;  Surgeon: Troy Sine, MD;  Location: [facility];  Service: Cardiovascular;  Laterality: N/A;  . CARDIAC CATHETERIZATION N/A 04/21/2016   Procedure: Coronary Stent Intervention;  Surgeon: Troy Sine, MD;  Location: [facility];  Service: Cardiovascular;  Laterality: N/A;  . CORONARY STENT INTERVENTION N/A 10/27/2017   Procedure: CORONARY STENT INTERVENTION;  Surgeon: Martinique, Peter M, MD;  Location: [facility];  Service: Cardiovascular;  Laterality: N/A;  . CORONARY/GRAFT ACUTE MI REVASCULARIZATION N/A 10/27/2017   Procedure: Coronary/Graft Acute MI Revascularization;  Surgeon: Martinique, Peter M, MD;  Location: [facility];  Service: Cardiovascular;  Laterality: N/A;  . CORONARY/GRAFT ACUTE MI  REVASCULARIZATION N/A 01/16/2018   Procedure: Coronary/Graft Acute MI Revascularization;  Surgeon: Martinique, Peter M, MD;  Location: [facility];  Service: Cardiovascular;  Laterality: N/A;  . ELECTROPHYSIOLOGIC STUDY N/A 04/21/2016   Procedure: Cardioversion;  Surgeon: Troy Sine, MD;  Location: [facility];  Service: Cardiovascular;  Laterality: N/A;  . LEFT HEART CATH AND CORONARY ANGIOGRAPHY N/A 10/27/2017   Procedure: LEFT HEART CATH AND CORONARY ANGIOGRAPHY;  Surgeon: Martinique, Peter M, MD;  Location: [facility];  Service: Cardiovascular;  Laterality: N/A;  . LEFT HEART CATH AND CORONARY ANGIOGRAPHY N/A 01/16/2018   Procedure: LEFT HEART CATH AND CORONARY ANGIOGRAPHY;  Surgeon: Martinique, Peter M, MD;  Location: [facility];  Service: Cardiovascular;  Laterality: N/A;    Current Medications: Outpatient Medications Prior to Visit  Medication Sig Dispense Refill  . aspirin 81 MG EC tablet Take 1 tablet (81 mg total) by mouth daily. 90 tablet 3  . atorvastatin (LIPITOR) 80 MG tablet Take 1 tablet (  80 mg total) by mouth every evening. 30 tablet 11  . clopidogrel (PLAVIX) 75 MG tablet Take 75 mg by mouth daily.    . nitroGLYCERIN (NITROSTAT) 0.4 MG SL tablet Place 1 tablet (0.4 mg total) under the tongue every 5 (five) minutes x 3 doses as needed for chest pain. 25 tablet 11   No facility-administered medications prior to visit.     Allergies:   Patient has no known allergies.   Social History   Socioeconomic History  . Marital status: Significant Other    Spouse name: Not on file  . Number of children: Not on file  . Years of education: Not on file  . Highest education level: Not on file  Occupational History  . Not on file  Tobacco Use  . Smoking status: Current Every Day Smoker    Packs/day: 1.00    Years: 40.00    Pack years: 40.00    Types: Cigarettes  . Smokeless tobacco: Never Used  Vaping Use  . Vaping Use: Never used  Substance and Sexual  Activity  . Alcohol use: No  . Drug use: Yes    Types: Cocaine    Comment: last use 01/16/18  . Sexual activity: Yes  Other Topics Concern  . Not on file  Social History Narrative  . Not on file   Social Determinants of Health   Financial Resource Strain:   . Difficulty of Paying Living Expenses: Not on file  Food Insecurity:   . Worried About Charity fundraiser in the Last Year: Not on file  . Ran Out of Food in the Last Year: Not on file  Transportation Needs:   . Lack of Transportation (Medical): Not on file  . Lack of Transportation (Non-Medical): Not on file  Physical Activity:   . Days of Exercise per Week: Not on file  . Minutes of Exercise per Session: Not on file  Stress:   . Feeling of Stress : Not on file  Social Connections:   . Frequency of Communication with Friends and Family: Not on file  . Frequency of Social Gatherings with Friends and Family: Not on file  . Attends Religious Services: Not on file  . Active Member of Clubs or Organizations: Not on file  . Attends Archivist Meetings: Not on file  . Marital Status: Not on file     Family History:  The patient's  family history includes CAD in her father.   ROS General: Negative; No fevers, chills, or night sweats;  HEENT: Negative; No changes in vision or hearing, sinus congestion, difficulty swallowing Pulmonary: Negative; No cough, wheezing, shortness of breath, hemoptysis Cardiovascular: Negative; No chest pain, presyncope, syncope, palpitations GI: Negative; No nausea, vomiting, diarrhea, or abdominal pain GU: Negative; No dysuria, hematuria, or difficulty voiding Musculoskeletal: Negative; no myalgias, joint pain, or weakness Hematologic/Oncology: Negative; no easy bruising, bleeding Endocrine: Negative; no heat/cold intolerance; no diabetes Neuro: Negative; no changes in balance, headaches Skin: Negative; No rashes or skin lesions Psychiatric: Negative; No behavioral problems,  depression Sleep: Negative; No snoring, daytime sleepiness, hypersomnolence, bruxism, restless legs, hypnogognic hallucinations, no cataplexy Other comprehensive 14 point system review is negative.   PHYSICAL EXAM:   VS:  BP (!) 142/80   Pulse 63   Ht _0  (1.702 m)   Wt 179 lb (81.2 kg)   BMI 28.04 kg/m     Repeat blood pressure by me was 136 over.  She states her blood pressure runs around 130/80.  Wt Readings from Last 3 Encounters:  03/11/20 179 lb (81.2 kg)  01/31/19 189 lb (85.7 kg)  06/18/18 186 lb 11.2 oz (84.7 kg)      BP (!) 142/80   Pulse 63   Ht _0  (1.702 m)   Wt 179 lb (81.2 kg)   BMI 28.04 kg/m  General: Alert, oriented, no distress.  Skin: normal turgor, no rashes, warm and dry HEENT: Normocephalic, atraumatic. Pupils equal round and reactive to light; sclera anicteric; extraocular muscles intact;  Nose without nasal septal hypertrophy Mouth/Parynx benign; Mallinpatti scale 3 Neck: No JVD, no carotid bruits; normal carotid upstroke Lungs: clear to ausculatation and percussion; no wheezing or rales Chest wall: without tenderness to palpitation Heart: PMI not displaced, RRR, s1 s2 normal, 1/6 systolic murmur, no diastolic murmur, no rubs, gallops, thrills, or heaves Abdomen: soft, nontender; no hepatosplenomehaly, BS+; abdominal aorta nontender and not dilated by palpation. Back: no CVA tenderness Pulses 2+ Musculoskeletal: full range of motion, normal strength, no joint deformities Extremities: no clubbing cyanosis or edema, Homan's sign negative  Neurologic: grossly nonfocal; Cranial nerves grossly wnl Psychologic: Normal mood and affect   Studies/Labs Reviewed:   EKG:  EKG is ordered today.  Normal sinus rhythm at 63 bpm, previously noted mild inferolateral T wave inversion.  No ectopy.  Normal intervals  October 2020 ECG (independently read by me): Sinus rhythm at 60 bpm.  No ectopy.  Normal intervals.  Minimal RV conduction delay  February  2020 ECG (independently read by me):Sinus Bradycardia 59; Mild T wave abnormality inferolaterally; Nl intervals  February 06, 2018 ECG (independently read by me): Normal sinus rhythm at 74 bpm.  Mild T wave abnormality inferolaterally  Recent Labs: BMP Latest Ref Rng & Units 06/18/2018 01/17/2018 01/16/2018  Glucose 65 - 99 mg/dL 109(H) 124(H) 208(H)  BUN 6 - 24 mg/dL _1 Creatinine 0.57 - 1.00 mg/dL 0.64 0.85 0.60  BUN/Creat Ratio 9 - 23 16 - -  Sodium 134 - 144 mmol/L 144 139 139  Potassium 3.5 - 5.2 mmol/L 4.8 4.1 4.0  Chloride 96 - 106 mmol/L 108(H) 109 107  CO2 20 - 29 mmol/L 22 19(L) -  Calcium 8.7 - 10.2 mg/dL 9.3 8.7(L) -     Hepatic Function Latest Ref Rng & Units 06/18/2018 01/16/2018 10/27/2017  Total Protein 6.0 - 8.5 g/dL 5.8(L) 5.1(L) 4.8(L)  Albumin 3.8 - 4.9 g/dL 4.2 3.1(L) 3.2(L)  AST 0 - 40 IU/L _2 ALT 0 - 32 IU/L _3 Alk Phosphatase 39 - 117 IU/L 113 74 86  Total Bilirubin 0.0 - 1.2 mg/dL 0.4 0.7 0.6  Bilirubin, Direct 0.1 - 0.5 mg/dL - - -    CBC Latest Ref Rng & Units 06/18/2018 01/17/2018 01/16/2018  WBC 3.4 - 10.8 x10E3/uL 13.5(H) 16.6(H) -  Hemoglobin 11.1 - 15.9 g/dL 14.6 14.2 15.3(H)  Hematocrit 34.0 - 46.6 % 43.9 45.3 45.0  Platelets 150 - 450 x10E3/uL 266 229 -   Lab Results  Component Value Date   MCV 84 06/18/2018   MCV 85.2 01/17/2018   MCV 86.1 01/16/2018   Lab Results  Component Value Date   TSH 0.963 06/18/2018   Lab Results  Component Value Date   HGBA1C 6.9 (H) 01/16/2018     BNP No results found for: BNP  ProBNP No results found for: PROBNP   Lipid Panel     Component Value Date/Time   CHOL 135 06/18/2018 1144   TRIG 150 (  H) 06/18/2018 1144   HDL 49 06/18/2018 1144   CHOLHDL 2.8 06/18/2018 1144   CHOLHDL 4.0 01/17/2018 0212   VLDL 44 (H) 01/17/2018 0212   LDLCALC 56 06/18/2018 1144     RADIOLOGY: No results found.   Additional studies/ records that were reviewed today include:  Reviewed the  patient's hospitalizations, cardiac catheterizations and interventions, laboratory, echo Doppler and CT assessments.  ASSESSMENT:    1. CAD in native artery   2. Hyperlipidemia LDL goal <70   3. Essential hypertension   4. Long term (current) use of antithrombotics/antiplatelets   5. Tobacco abuse   6. 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  Remarkably, despite at least 4 acute catheterizations, her LV function has remained essentially normal without regional wall motion abnormalities.  When I saw her in October 2019 after not having seen her since 2017 I spent considerable time with her and discussed the absolute importance of major lifestyle change.  During that evaluation, she seemed to "connect "with me and became very tearful and states that that discussion completely changed her life.  Since that time she made major lifestyle change.  She [facility]  Over the past year, she denies any recurrent chest pain symptoms.  Unfortunately she continues to smoke cigarettes again discussed the importance of smoking cessation.  Over the past year she has still been using cocaine on a very intermittent basis.  I again discussed the importance of complete cessation and potential for vasospasm.  Her blood pressure today was mildly elevated.  I discussed most recent hypertensive guidelines.  Target blood pressure is less than 130/80.  She will continue to monitor blood pressure at home increased she may require initiation of treatment.  Apparently she is no longer taking her atorvastatin.  She has not had recent laboratory.  Last cholesterol in February 2020 135 with an LDL cholesterol at 1 recheck a set of fasting labs over the  next several weeks.  She was nonfasting today.  I will contact her regarding her laboratory.  I will see her back in the office in 1 year or sooner as needed.   Medication Adjustments/Labs and Tests Ordered: Current medicines are reviewed at length with the patient today.  Concerns regarding medicines are outlined above.  Medication changes, Labs and Tests ordered today are listed in the Patient Instructions below. Patient Instructions  Medication Instructions:  Continue current medications  *If you need a refill on your cardiac medications before your next appointment, please call your pharmacy*   Lab Work: CBC, TSH, CMP and Fasting Lipids  If you have labs (blood work) drawn today and your tests are completely normal, you will receive your results only by: Marland Kitchen MyChart Message (if you have MyChart) OR . A paper copy in the mail If you have any lab test that is abnormal or we need to change your treatment, we will call you to review the results.   Testing/Procedures: None Ordered   Follow-Up: At [facility], you and your health needs are our priority.  As part of our continuing mission to provide you with exceptional heart care, we have created designated Provider Care Teams.  These Care Teams include your primary Cardiologist (physician) and Advanced Practice Providers (  APPs -  Physician Assistants and Nurse Practitioners) who all work together to provide you with the care you need, when you need it.  We recommend signing up for the patient portal called "MyChart".  Sign up information is provided on this After Visit Summary.  MyChart is used to connect with patients for Virtual Visits (Telemedicine).  Patients are able to view lab/test results, encounter notes, upcoming appointments, etc.  Non-urgent messages can be sent to your provider as well.   To learn more about what you can do with MyChart, go to [email or web address].    Your next appointment:   1 year(s)  The format  for your next appointment:   In Person  Provider:   You may see Shelva Majestic, MD or one of the following Advanced Practice Providers on your designated Care Team:    Almyra Deforest, PA-C  Fabian Sharp, PA-C or   Roby Lofts, [place]         Signed, Shelva Majestic, MD  03/11/2020 10:15 AM    [facility] [address] [place], [place], [place]  [address] Phone: [phone]

## 2020-03-11 NOTE — Patient Instructions (Signed)
Medication Instructions:  Continue current medications  *If you need a refill on your cardiac medications before your next appointment, please call your pharmacy*   Lab Work: CBC, TSH, CMP and Fasting Lipids  If you have labs (blood work) drawn today and your tests are completely normal, you will receive your results only by: Marland Kitchen MyChart Message (if you have MyChart) OR . A paper copy in the mail If you have any lab test that is abnormal or we need to change your treatment, we will call you to review the results.   Testing/Procedures: None Ordered   Follow-Up: At [facility], you and your health needs are our priority.  As part of our continuing mission to provide you with exceptional heart care, we have created designated Provider Care Teams.  These Care Teams include your primary Cardiologist (physician) and Advanced Practice Providers (APPs -  Physician Assistants and Nurse Practitioners) who all work together to provide you with the care you need, when you need it.  We recommend signing up for the patient portal called "MyChart".  Sign up information is provided on this After Visit Summary.  MyChart is used to connect with patients for Virtual Visits (Telemedicine).  Patients are able to view lab/test results, encounter notes, upcoming appointments, etc.  Non-urgent messages can be sent to your provider as well.   To learn more about what you can do with MyChart, go to [email or web address].    Your next appointment:   1 year(s)  The format for your next appointment:   In Person  Provider:   You may see Nicki Guadalajara, MD or one of the following Advanced Practice Providers on your designated Care Team:    Azalee Course, PA-C  Micah Flesher, PA-C or   Judy Pimple, [place]

## 2020-04-10 IMAGING — CT CT ANGIO CHEST
2 of 5 series · 18 of 46 positions shown · IV contrast (iopamidol)
Comparison: Chest x-ray 10/27/2017

CLINICAL DATA: 57-year-old female with chest pain, possible
abnormality in the aortic arch

EXAM:
CT ANGIOGRAPHY CHEST WITH CONTRAST
TECHNIQUE: Multidetector CT imaging of the chest was performed using the
standard protocol during bolus administration of intravenous
contrast. Multiplanar CT image reconstructions and MIPs were
obtained to evaluate the vascular anatomy.
CONTRAST:  100mL O0PWGV-VTR IOPAMIDOL (O0PWGV-VTR) INJECTION 76%

[Series 5: dissection 2mm · axial · 0.63mm/px · z∈[+996,+1294]mm · 15 of 166 slices shown]
[im 11/166  lung]
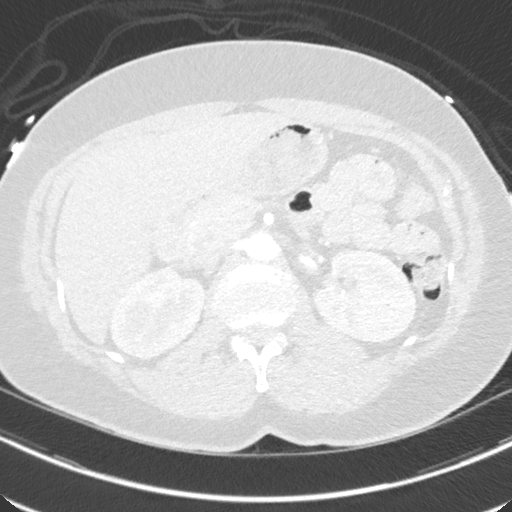
[im 22/166  soft-tissue]
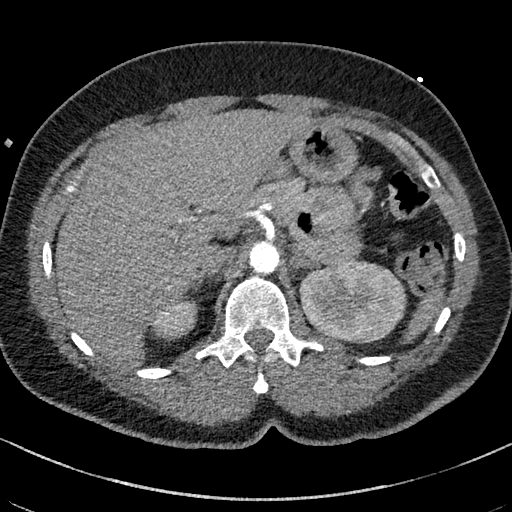
[im 32/166  lung]
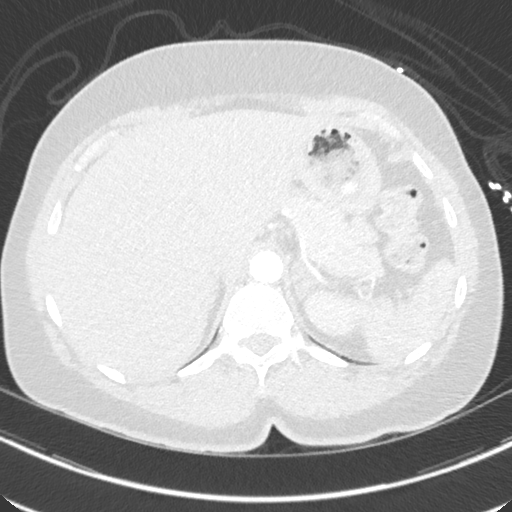
[im 43/166  soft-tissue]
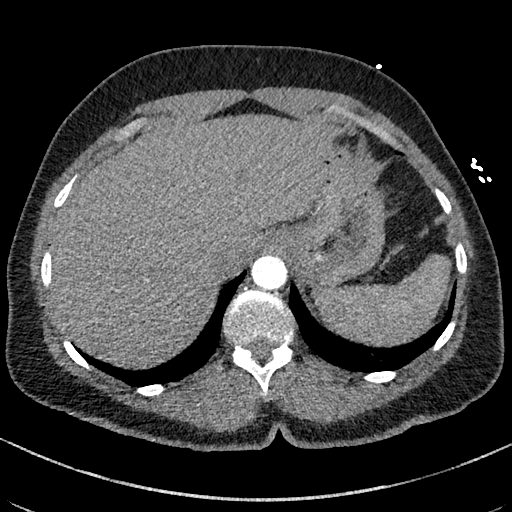
[im 54/166  lung]
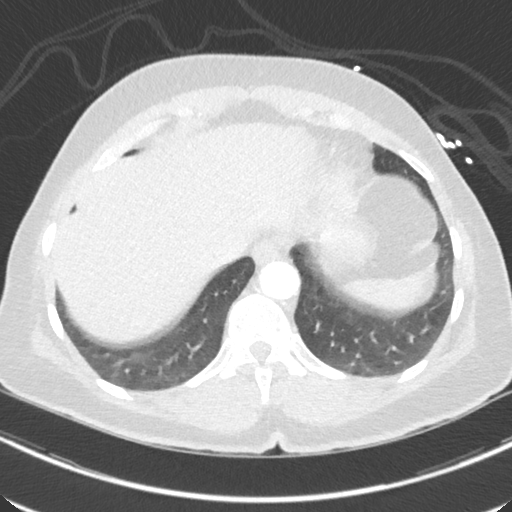
[im 64/166  soft-tissue]
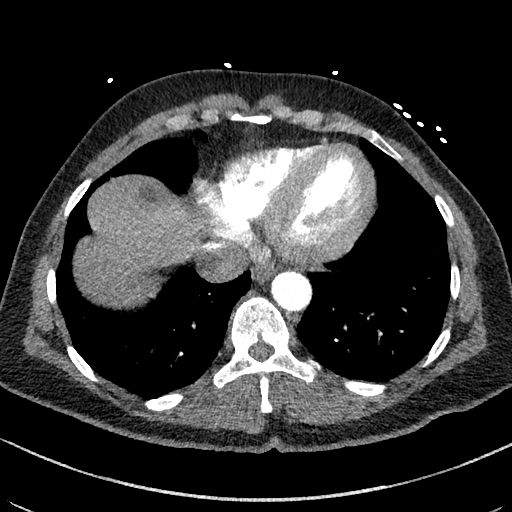
[im 75/166  lung]
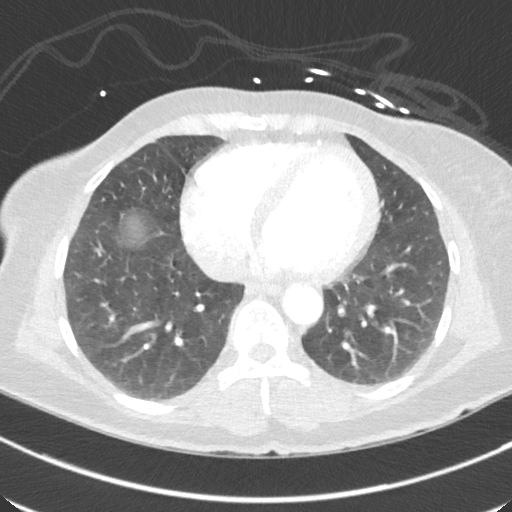
[im 86/166  soft-tissue]
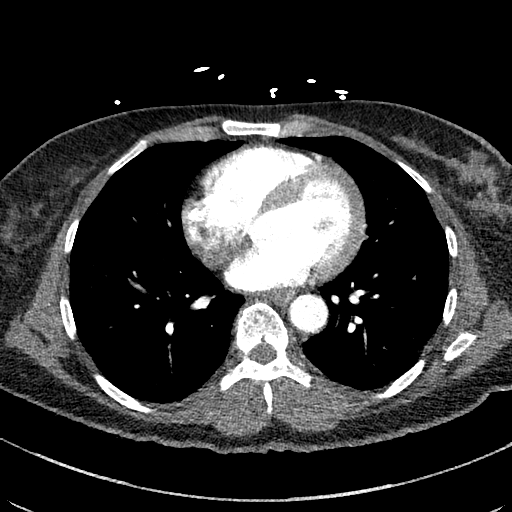
[im 96/166  lung]
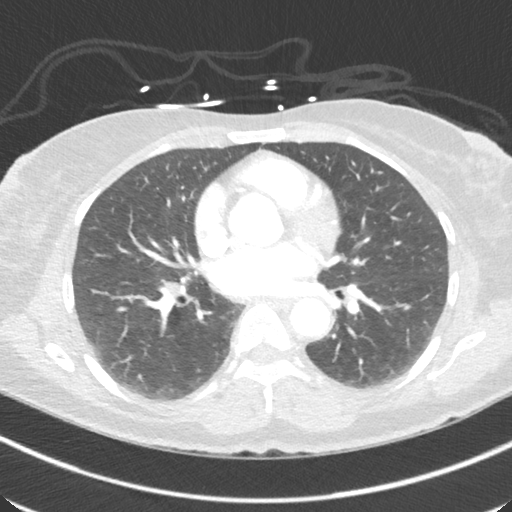
[im 107/166  soft-tissue]
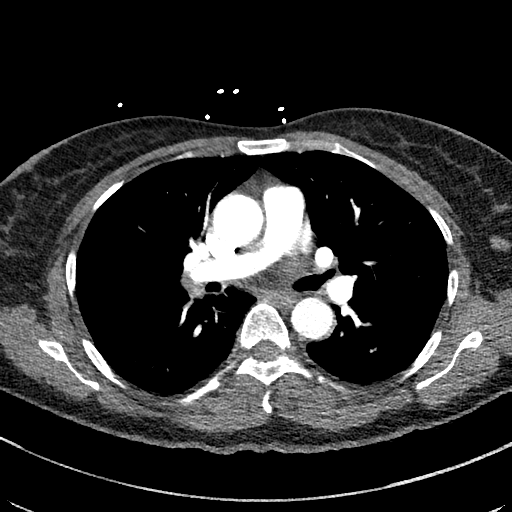
[im 118/166  lung]
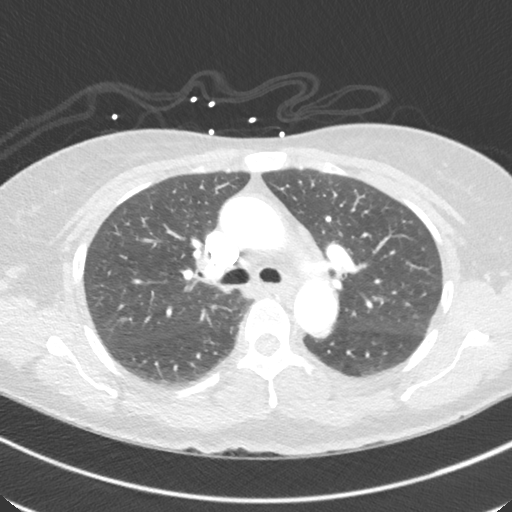
[im 128/166  soft-tissue]
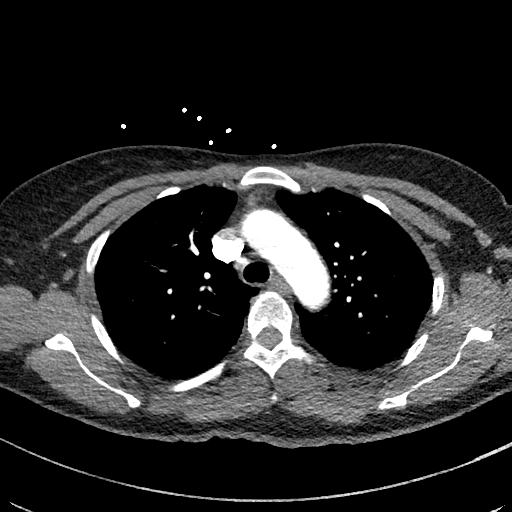
[im 139/166  lung]
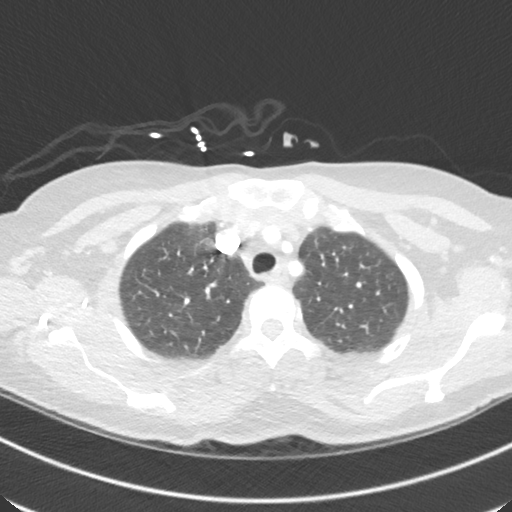
[im 150/166  soft-tissue]
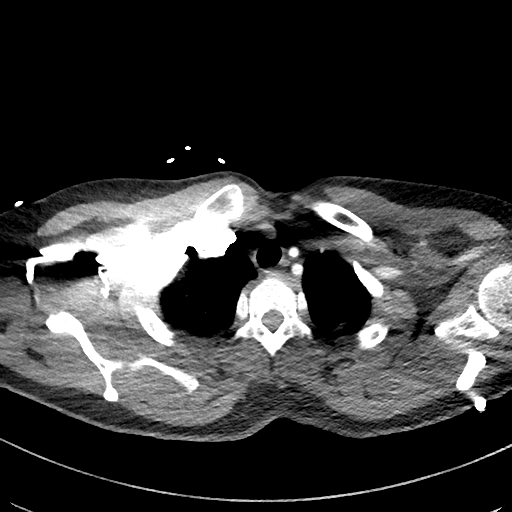
[im 160/166  lung]
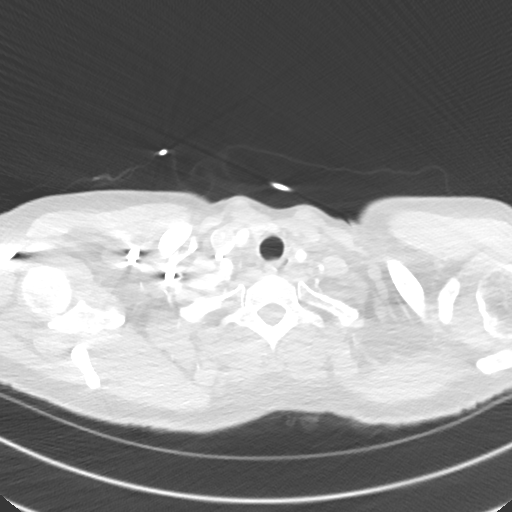

[Series 8: dissection 2mm cor · coronal · 0.61mm/px · 3 of 109 slices shown]
[im 28/109  soft-tissue]
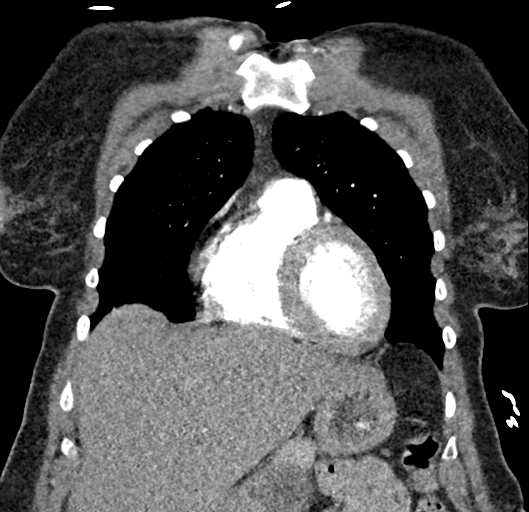
[im 55/109  soft-tissue]
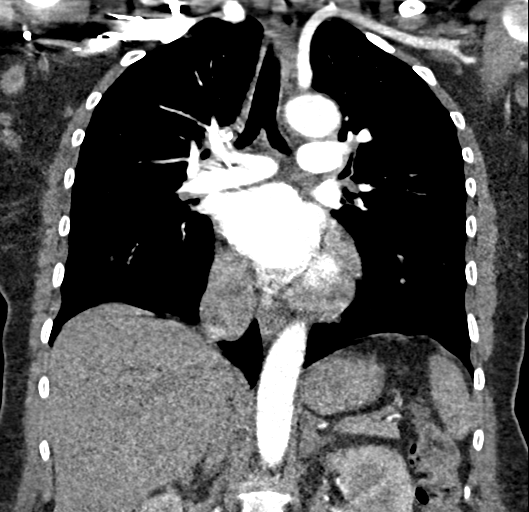
[im 82/109  soft-tissue]
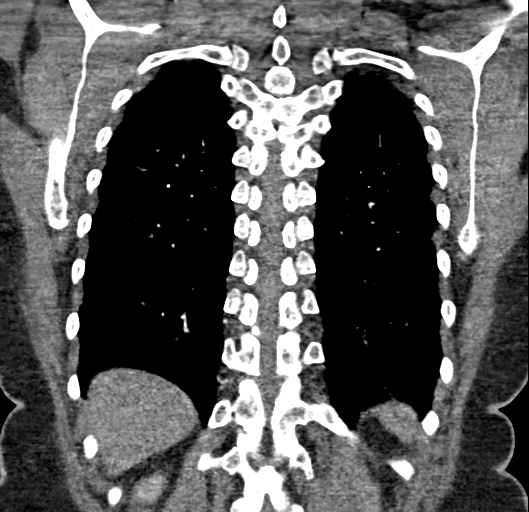

[18 of 46 positions shown; findings below may reference images not displayed]

FINDINGS: CTA CHEST FINDINGS

Cardiovascular: 2 vessel arch anatomy. The right brachiocephalic and
left common carotid artery share a common origin. The aortic root is
normal in caliber. There is no effacement of the [place]
junction. The ascending, transverse and descending thoracic aorta
are all normal in caliber. No evidence of aneurysm or dissection.
Calcifications are present in the coronary arteries. There appears
to be a metallic stent in the left anterior descending coronary
artery. The heart is at the upper limits of normal for size. No
pericardial effusion. Unremarkable main and central pulmonary
arteries.

Mediastinum/Nodes: Unremarkable CT appearance of the thyroid gland.
No suspicious mediastinal or hilar adenopathy. No soft tissue
mediastinal mass. The thoracic esophagus is unremarkable.

Lungs/Pleura: Lungs are clear. No pleural effusion or pneumothorax.

Musculoskeletal: No chest wall abnormality. No acute or significant
osseous findings.

Upper abdomen: Incidentally noted 1 cm simple cyst in the hepatic
dome. No acute abnormality within the visualized upper abdomen.

Review of the MIP images confirms the above findings.
IMPRESSION: 1. No evidence of aortic dissection, aneurysm or other acute
vascular abnormality.
2. No evidence of pneumonia or other acute cardiopulmonary process.
3. Coronary artery disease with a stent visualized in the left
anterior descending coronary artery.

## 2020-10-07 ENCOUNTER — Telehealth: Payer: Self-pay | Admitting: Cardiovascular Disease

## 2020-10-07 NOTE — Telephone Encounter (Signed)
Dr. Tresa Endo  This patient is to undergo 5 teeth extractions and is on ASA and Plavix for her extensive CAD. Can you make holding recommendations for the dental team going forward.   Please send your recs to the pre op pool   Thank you

## 2020-10-07 NOTE — Telephone Encounter (Signed)
   [facility] Pre-operative Risk Assessment    Request for surgical clearance:  What type of surgery is being performed?  Extractions of teeth #6, 11, 21, 23, 26  When is this surgery scheduled? TBD    What type of clearance is required (medical clearance vs. Pharmacy clearance to hold med vs. Both)?  Both   Are there any medications that need to be held prior to surgery and how long? Their office is inquiring whether or not the patient will need to hold any medications  Practice name and name of physician performing surgery?  [facility]  Dr. Mariana Arn, DDS  What is your office phone number? [phone]   7.   What is your office fax number? [phone]  8.   Anesthesia type (None, local, MAC, general) ? Local    Zara Council 10/07/2020, 2:55 PM  _________________________________________________________________

## 2020-10-15 NOTE — Telephone Encounter (Signed)
Left VM

## 2020-10-15 NOTE — Telephone Encounter (Signed)
Okay to hold Plavix for 5 days prior to the dental extractions.  Preferable to continue aspirin if at all possible with her significant CAD.

## 2020-10-16 NOTE — Telephone Encounter (Signed)
   Primary Cardiologist: Nicki Guadalajara, MD  Chart reviewed as part of pre-operative protocol coverage. Given past medical history and time since last visit, based on ACC/AHA guidelines, Natasha Roach would be at acceptable risk for the planned procedure without further cardiovascular testing.   Per Dr. Tresa Endo  She may hold her Plavix for 5 days prior to her dental extractions.  Due to her significant coronary artery disease she will need to continue her aspirin through her procedure.  I will route this recommendation to the requesting party via Epic fax function and remove from pre-op pool.  Please call with questions.  Thomasene Ripple. Rolando Whitby NP-C    10/16/2020, 3:23 PM [facility] [address] Office [phone] Fax [phone]

## 2021-04-07 ENCOUNTER — Encounter: Payer: Self-pay | Admitting: Cardiovascular Disease

## 2021-04-07 ENCOUNTER — Other Ambulatory Visit: Payer: Self-pay

## 2021-04-07 ENCOUNTER — Ambulatory Visit (INDEPENDENT_AMBULATORY_CARE_PROVIDER_SITE_OTHER): Payer: Self-pay | Admitting: Cardiovascular Disease

## 2021-04-07 DIAGNOSIS — E785 Hyperlipidemia, unspecified: Secondary | ICD-10-CM

## 2021-04-07 DIAGNOSIS — I10 Essential (primary) hypertension: Secondary | ICD-10-CM

## 2021-04-07 DIAGNOSIS — Z87898 Personal history of other specified conditions: Secondary | ICD-10-CM

## 2021-04-07 DIAGNOSIS — Z7902 Long term (current) use of antithrombotics/antiplatelets: Secondary | ICD-10-CM

## 2021-04-07 DIAGNOSIS — I2510 Atherosclerotic heart disease of native coronary artery without angina pectoris: Secondary | ICD-10-CM

## 2021-04-07 DIAGNOSIS — Z720 Tobacco use: Secondary | ICD-10-CM

## 2021-04-07 NOTE — Progress Notes (Signed)
Cardiology Office Note    Date:  04/13/2021   ID:  ONESTY CLAIR, DOB 01-May-1960, MRN [id number]  PCP:  Marliss Coots, NP  Cardiologist:  Shelva Majestic, MD   13 month F/U office visit with me  History of Present Illness:  Natasha Roach is a 60 y.o. female who presents for a 13 month follow-up office evaluation.    Natasha Roach has a history of tobacco use, hypertension, diabetes mellitus and cocaine use.  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  I saw her on April 21, 2016 when she presented with 10 out of 10 chest pain shortly after using crack cocaine.  She was found to have acute ST segment elevation MI secondary to total occlusion of the proximal LAD with initial TIMI 0 flow and no evidence for collateralization.  She had normal circumflex and small right coronary artery.  She developed reperfusion induced ventricular tachycardia requiring cardioversion/defibrillation and amiodarone bolus.  Ultimately her LAD was successfully stented with a 3.5 x 20 mm DES stent postdilated 3.78 mm with resumption of TIMI-3 flow in a very large LAD vessel.  I had not seen the patient since that presentation.  Apparently, over the past several years she had recurrent unfortunate issues with repeat chest pain.  She represented in July 2019 again presenting with severe chest pain following cocaine use.  She was found to have a thrombotic lesion in the LAD just proximal to her prior stent which was successfully intervened upon with insertion of a DES stent.  Underwent PCI to her LAD.  She ultimately signed out [place].  She represented again in September and underwent repeat catheterization on January 16, 2018 by Dr. Martinique.  Her LAD stent was patent but she had a 90% stenosis in the diagonal branch which was treated with PTCA and not stented.  An echo Doppler study from January 16, 2018 showed an EF of 50 to 55% without regional wall  motion abnormalities.  The aorta was mildly calcified with a questionable density in the aortic arch since her last event, she is now been on Plavix regularly previously had run out of her medication leading to recurrent events.  Since her most recent intervention, she has not experienced any recurrent chest pain.  Unfortunately she still smokes cigarettes.  She is on atorvastatin 80 mg for hyperlipidemia, aspirin and Plavix.    When I saw her on October 5 18th 2019 I had a very lengthy discussion with her and stressed the importance of significant lifestyle adjustment, smoking cessation, discontinuance of drugs, during that discussion she finally seem to understand the importance and made a major effort in improving her lifestyle.   When I saw her in February 2020, she was extremely gratified for the time that I had spent with her and became very tearful that that discussion was of major impact in her significant lifestyle adjustment.  She had dramatically reduced drug use but unfortunately still continues to smoke.  She was 100% percent compliant with taking her medications.  Her echo Doppler study in September 2019 showed an EF of 50 to 55% with normal wall motion and was without any regional wall motion abnormalities suggesting complete salvage of myocardium despite her prior event.  She continued to have some issues with her spinal stenosis which limited her exercise.  I saw her in October 2020 at which time she continued to be very grateful and remained  stable from a cardiovascular standpoint.  [facility]  The patient denies any recent drug use.  She continues to be on aspirin and Plavix as well as atorvastatin 80 mg daily.  She was not having any anginal symptoms or exertional dyspnea.    I last saw her on March 11, 2020 the prior year she had continued to do well.  At that time, she was still walking 1 to 1-1/2 packs of cigarettes  per day.  She continued to try to avoid cocaine but on several instances did use cocaine intermittently.  She had not been taking her atorvastatin.  During that evaluation her blood pressure was mildly elevated and I discussed most recent hypertensive guidelines with target blood pressure less than 130/80.  Since I last saw her, she denies any recurrent chest pain or shortness of breath.  She continues to smoke 1 pack of cigarettes per day.  There has been rare use of cocaine over the past year.  Her daughter has end-stage renal disease on dialysis and will need heart surgery.  She has not had recent laboratory.  She presents for evaluation.  Past Medical History:  Diagnosis Date   CAD (coronary artery disease) 04/23/2016   a. suspected cocaine induced NSTEMI 12/2015; pt declined cath. b. STEMI 03/2016 due to cocaine, occ mLAD s/p Synergy stent, EF 40% by cath, 55% by echo.   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 at time of STEMI 03/2016.    Past Surgical History:  Procedure Laterality Date   ABDOMINAL HYSTERECTOMY     BREAST SURGERY     abcess   CARDIAC CATHETERIZATION  2004   CARDIAC CATHETERIZATION N/A 04/21/2016   Procedure: Left Heart Cath and Coronary Angiography;  Surgeon: Troy Sine, MD;  Location: [place] [facility];  Service: Cardiovascular;  Laterality: N/A;   CARDIAC CATHETERIZATION N/A 04/21/2016   Procedure: Coronary Stent Intervention;  Surgeon: Troy Sine, MD;  Location: [place] [facility];  Service: Cardiovascular;  Laterality: N/A;   CORONARY STENT INTERVENTION N/A 10/27/2017   Procedure: CORONARY STENT INTERVENTION;  Surgeon:  Martinique, Peter M, MD;  Location: [place] [facility];  Service: Cardiovascular;  Laterality: N/A;   CORONARY/GRAFT ACUTE MI REVASCULARIZATION N/A 10/27/2017   Procedure: Coronary/Graft Acute MI Revascularization;  Surgeon: Martinique, Peter M, MD;  Location: [place] [facility];  Service: Cardiovascular;  Laterality: N/A;   CORONARY/GRAFT ACUTE MI REVASCULARIZATION N/A 01/16/2018   Procedure: Coronary/Graft Acute MI Revascularization;  Surgeon: Martinique, Peter M, MD;  Location: [place] [facility];  Service: Cardiovascular;  Laterality: N/A;   ELECTROPHYSIOLOGIC STUDY N/A 04/21/2016   Procedure: Cardioversion;  Surgeon: Troy Sine, MD;  Location: [place] [facility];  Service: Cardiovascular;  Laterality: N/A;   LEFT HEART CATH AND CORONARY ANGIOGRAPHY N/A 10/27/2017   Procedure: LEFT HEART CATH AND CORONARY ANGIOGRAPHY;  Surgeon: Martinique, Peter M, MD;  Location: [place] [facility];  Service: Cardiovascular;  Laterality: N/A;   LEFT HEART CATH AND CORONARY ANGIOGRAPHY N/A 01/16/2018  Procedure: LEFT HEART CATH AND CORONARY ANGIOGRAPHY;  Surgeon: Martinique, Peter M, MD;  Location: [place] [facility];  Service: Cardiovascular;  Laterality: N/A;    Current Medications: Outpatient Medications Prior to Visit  Medication Sig Dispense Refill   aspirin 81 MG EC tablet Take 1 tablet (81 mg total) by mouth daily. 90 tablet 3   atorvastatin (LIPITOR) 80 MG tablet Take 1 tablet (80 mg total) by mouth every evening. 30 tablet 11   clopidogrel (PLAVIX) 75 MG tablet Take 75 mg by mouth daily.     nitroGLYCERIN (NITROSTAT) 0.4 MG SL tablet Place 1 tablet (0.4 mg total) under the tongue every 5 (five) minutes x 3 doses as needed for chest pain. 25 tablet 11   No facility-administered medications prior to visit.     Allergies:   Patient has no known allergies.   Social History   Socioeconomic History   Marital status: Significant Other    Spouse name: Not on file   Number of children: Not on file   Years of education:  Not on file   Highest education level: Not on file  Occupational History   Not on file  Tobacco Use   Smoking status: Every Day    Packs/day: 1.00    Years: 40.00    Pack years: 40.00    Types: Cigarettes   Smokeless tobacco: Never  Vaping Use   Vaping Use: Never used  Substance and Sexual Activity   Alcohol use: No   Drug use: Yes    Types: Cocaine    Comment: last use 01/16/18   Sexual activity: Yes  Other Topics Concern   Not on file  Social History Narrative   Not on file   Social Determinants of Health   Financial Resource Strain: Not on file  Food Insecurity: Not on file  Transportation Needs: Not on file  Physical Activity: Not on file  Stress: Not on file  Social Connections: Not on file     Family History:  The patient's  family history includes CAD in her father.   ROS General: Negative; No fevers, chills, or night sweats;  HEENT: Negative; No changes in vision or hearing, sinus congestion, difficulty swallowing Pulmonary: Negative; No cough, wheezing, shortness of breath, hemoptysis Cardiovascular: Negative; No chest pain, presyncope, syncope, palpitations GI: Negative; No nausea, vomiting, diarrhea, or abdominal pain GU: Negative; No dysuria, hematuria, or difficulty voiding Musculoskeletal: Negative; no myalgias, joint pain, or weakness Hematologic/Oncology: Negative; no easy bruising, bleeding Endocrine: Negative; no heat/cold intolerance; no diabetes Neuro: Negative; no changes in balance, headaches Skin: Negative; No rashes or skin lesions Psychiatric: Negative; No behavioral problems, depression Sleep: Negative; No snoring, daytime sleepiness, hypersomnolence, bruxism, restless legs, hypnogognic hallucinations, no cataplexy Other comprehensive 14 point system review is negative.   PHYSICAL EXAM:   VS:  BP 116/70    Pulse 72    Ht _0  (1.702 m)    Wt 178 lb 6.4 oz (80.9 kg)    SpO2 98%    BMI 27.94 kg/m     Repeat blood pressure by me was 118  over  Wt Readings from Last 3 Encounters:  04/07/21 178 lb 6.4 oz (80.9 kg)  03/11/20 179 lb (81.2 kg)  01/31/19 189 lb (85.7 kg)    General: Alert, oriented, no distress.  Skin: normal turgor, no rashes, warm and dry HEENT: Normocephalic, atraumatic. Pupils equal round and reactive to light; sclera anicteric; extraocular muscles intact;  Nose without nasal septal hypertrophy Mouth/Parynx benign;  Mallinpatti scale 3 Neck: No JVD, no carotid bruits; normal carotid upstroke Lungs: clear to ausculatation and percussion; no wheezing or rales Chest wall: without tenderness to palpitation Heart: PMI not displaced, RRR, s1 s2 normal, 1/6 systolic murmur, no diastolic murmur, no rubs, gallops, thrills, or heaves Abdomen: soft, nontender; no hepatosplenomehaly, BS+; abdominal aorta nontender and not dilated by palpation. Back: no CVA tenderness Pulses 2+ Musculoskeletal: full range of motion, normal strength, no joint deformities Extremities: no clubbing cyanosis or edema, Homan's sign negative  Neurologic: grossly nonfocal; Cranial nerves grossly wnl Psychologic: Normal mood and affect   Studies/Labs Reviewed:   April 07, 2021 ECG (independently read by me): NSR at 72, mild inferolateral T wave abnormality  March 11, 2020 EKG:  EKG is ordered today.  Normal sinus rhythm at 63 bpm, previously noted mild inferolateral T wave inversion.  No ectopy.  Normal intervals  October 2020 ECG (independently read by me): Sinus rhythm at 60 bpm.  No ectopy.  Normal intervals.  Minimal RV conduction delay  February 2020 ECG (independently read by me):Sinus Bradycardia 59; Mild T wave abnormality inferolaterally; Nl intervals  February 06, 2018 ECG (independently read by me): Normal sinus rhythm at 74 bpm.  Mild T wave abnormality inferolaterally  Recent Labs: BMP Latest Ref Rng & Units 06/18/2018 01/17/2018 01/16/2018  Glucose 65 - 99 mg/dL 109(H) 124(H) 208(H)  BUN 6 - 24 mg/dL _0 Creatinine 0.57 - 1.00 mg/dL 0.64 0.85 0.60  BUN/Creat Ratio 9 - 23 16 - -  Sodium 134 - 144 mmol/L 144 139 139  Potassium 3.5 - 5.2 mmol/L 4.8 4.1 4.0  Chloride 96 - 106 mmol/L 108(H) 109 107  CO2 20 - 29 mmol/L 22 19(L) -  Calcium 8.7 - 10.2 mg/dL 9.3 8.7(L) -     Hepatic Function Latest Ref Rng & Units 06/18/2018 01/16/2018 10/27/2017  Total Protein 6.0 - 8.5 g/dL 5.8(L) 5.1(L) 4.8(L)  Albumin 3.8 - 4.9 g/dL 4.2 3.1(L) 3.2(L)  AST 0 - 40 IU/L _1 ALT 0 - 32 IU/L _2 Alk Phosphatase 39 - 117 IU/L 113 74 86  Total Bilirubin 0.0 - 1.2 mg/dL 0.4 0.7 0.6  Bilirubin, Direct 0.1 - 0.5 mg/dL - - -    CBC Latest Ref Rng & Units 06/18/2018 01/17/2018 01/16/2018  WBC 3.4 - 10.8 x10E3/uL 13.5(H) 16.6(H) -  Hemoglobin 11.1 - 15.9 g/dL 14.6 14.2 15.3(H)  Hematocrit 34.0 - 46.6 % 43.9 45.3 45.0  Platelets 150 - 450 x10E3/uL 266 229 -   Lab Results  Component Value Date   MCV 84 06/18/2018   MCV 85.2 01/17/2018   MCV 86.1 01/16/2018   Lab Results  Component Value Date   TSH 0.963 06/18/2018   Lab Results  Component Value Date   HGBA1C 6.9 (H) 01/16/2018     BNP No results found for: BNP  ProBNP No results found for: PROBNP   Lipid Panel     Component Value Date/Time   CHOL 135 06/18/2018 1144   TRIG 150 (H) 06/18/2018 1144   HDL 49 06/18/2018 1144   CHOLHDL 2.8 06/18/2018 1144   CHOLHDL 4.0 01/17/2018 0212   VLDL 44 (H) 01/17/2018 0212   LDLCALC 56 06/18/2018 1144     RADIOLOGY: No results found.   Additional studies/ records that were reviewed today include:  Reviewed the patient's hospitalizations, cardiac catheterizations and interventions, laboratory, echo Doppler and CT assessments.  ASSESSMENT:    1. Coronary artery  disease involving native coronary artery of native heart without angina pectoris   2. Hyperlipidemia LDL goal <70   3. History of borderline  hypertension   4. Long term (current) use of antithrombotics/antiplatelets   5. Tobacco  abuse   6. 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  Remarkably, despite at least 4 acute catheterizations, her LV function has remained essentially normal without regional wall motion abnormalities.  When I saw her in October 2019 after not having seen her since 2017 I spent considerable time with her and discussed the absolute importance of major lifestyle change.  During that evaluation, she seemed to "connect " with me and became very tearful and states that that discussion completely changed her life.  Since that time she made major lifestyle change.  She [facility]abell [facility]na [facility]  She continues to be on DAPT long-term.  He has previously noted T wave abnormality on ECG.  Presently, her blood pressure is stable and improved; she is not on antihypertensive medication.  She continues to smoke 1 pack of cigarettes per day and to rare use of cocaine.  I again discussed the absolute importance of complete cessation.  Remotely she had been on atorvastatin but has not been taking this.  She has not had recent laboratory.  I am checking a comprehensive metabolic panel TSH CBC and fasting lipid studies.  I will contact her regarding her laboratory results and recommendations will be made if necessary.  Otherwise as long as she remains stable I see her in 1 year for follow-up evaluation.    Medication Adjustments/Labs and Tests Ordered: Current medicines are reviewed at length with the patient today.  Concerns regarding medicines are outlined above.  Medication changes, Labs and Tests ordered today are listed in the Patient Instructions below. Patient Instructions  Medication Instructions:  The current medical regimen is effective;  continue  present plan and medications.  *If you need a refill on your cardiac medications before your next appointment, please call your pharmacy*   Lab Work: TSH, CMET, LIPID, TSH today   If you have labs (blood work) drawn today and your tests are completely normal, you will receive your results only by: [place] (if you have MyChart) OR A paper copy in the mail If you have any lab test that is abnormal or we need to change your treatment, we will call you to review the results.  Follow-Up: At [facility], you and your health needs are our priority.  As part of our continuing mission to provide you with exceptional heart care, we have created designated Provider Care Teams.  These Care Teams include your primary Cardiologist (physician) and Advanced Practice Providers (APPs -  Physician Assistants and Nurse Practitioners) who all work together to provide you with the care you need, when you need it.  We recommend signing up for the patient portal called "MyChart".  Sign up information is provided on this After Visit Summary.  MyChart is used to connect with patients for Virtual Visits (Telemedicine).  Patients are able to view lab/test results, encounter notes, upcoming appointments, etc.  Non-urgent messages can be sent to your provider as well.   To learn more about what you can do with MyChart, go to  [email or web address].    Your next appointment:   12 month(s)  The format for your next appointment:   In Person  Provider:   Shelva Majestic, MD       Signed, Shelva Majestic, MD  04/13/2021 5:54 PM    [facility] [address] [place], [place],   [address] Phone: [phone]

## 2021-04-07 NOTE — Patient Instructions (Signed)
Medication Instructions:  The current medical regimen is effective;  continue present plan and medications.  *If you need a refill on your cardiac medications before your next appointment, please call your pharmacy*   Lab Work: TSH, CMET, LIPID, TSH today   If you have labs (blood work) drawn today and your tests are completely normal, you will receive your results only by: MyChart Message (if you have MyChart) OR A paper copy in the mail If you have any lab test that is abnormal or we need to change your treatment, we will call you to review the results.  Follow-Up: At [facility], you and your health needs are our priority.  As part of our continuing mission to provide you with exceptional heart care, we have created designated Provider Care Teams.  These Care Teams include your primary Cardiologist (physician) and Advanced Practice Providers (APPs -  Physician Assistants and Nurse Practitioners) who all work together to provide you with the care you need, when you need it.  We recommend signing up for the patient portal called "MyChart".  Sign up information is provided on this After Visit Summary.  MyChart is used to connect with patients for Virtual Visits (Telemedicine).  Patients are able to view lab/test results, encounter notes, upcoming appointments, etc.  Non-urgent messages can be sent to your provider as well.   To learn more about what you can do with MyChart, go to [email or web address].    Your next appointment:   12 month(s)  The format for your next appointment:   In Person  Provider:   Nicki Guadalajara, MD

## 2021-04-13 ENCOUNTER — Encounter: Payer: Self-pay | Admitting: Cardiovascular Disease

## 2021-09-28 ENCOUNTER — Ambulatory Visit: Payer: Self-pay | Admitting: Orthopaedic Surgery

## 2022-06-06 ENCOUNTER — Ambulatory Visit: Payer: Medicaid Other | Attending: Cardiovascular Disease | Admitting: Cardiovascular Disease

## 2022-06-08 ENCOUNTER — Telehealth: Payer: Self-pay | Admitting: Cardiovascular Disease

## 2022-06-08 NOTE — Telephone Encounter (Signed)
*  STAT* If patient is at the pharmacy, call can be transferred to refill team.   1. Which medications need to be refilled? (please list name of each medication and dose if known)   clopidogrel (PLAVIX) 75 MG tablet    2. Which pharmacy/location (including street and city if local pharmacy) is medication to be sent to?  [facility] ([place]), [place] - [address]    3. Do they need a 30 day or 90 day supply? 30 day   Pt has scheduled appt on 07/05/22 Pt is out of medication

## 2022-06-09 ENCOUNTER — Other Ambulatory Visit: Payer: Self-pay | Admitting: Physician Assistant

## 2022-06-09 DIAGNOSIS — Z1231 Encounter for screening mammogram for malignant neoplasm of breast: Secondary | ICD-10-CM

## 2022-06-13 ENCOUNTER — Other Ambulatory Visit: Payer: Self-pay

## 2022-06-13 MED ORDER — CLOPIDOGREL BISULFATE 75 MG PO TABS: 75.0000 mg | ORAL_TABLET | Freq: Every day | ORAL | 0 refills | Status: DC

## 2022-07-05 ENCOUNTER — Other Ambulatory Visit: Payer: Self-pay

## 2022-07-05 ENCOUNTER — Ambulatory Visit: Payer: Medicaid Other | Attending: Nurse Practitioner | Admitting: Nurse Practitioner

## 2022-07-05 ENCOUNTER — Encounter: Payer: Self-pay | Admitting: Nurse Practitioner

## 2022-07-05 VITALS — BP 124/86 | HR 66 | Ht 67.0 in | Wt 175.0 lb

## 2022-07-05 DIAGNOSIS — I10 Essential (primary) hypertension: Secondary | ICD-10-CM | POA: Diagnosis not present

## 2022-07-05 DIAGNOSIS — E785 Hyperlipidemia, unspecified: Secondary | ICD-10-CM

## 2022-07-05 DIAGNOSIS — E1159 Type 2 diabetes mellitus with other circulatory complications: Secondary | ICD-10-CM | POA: Diagnosis not present

## 2022-07-05 DIAGNOSIS — Z720 Tobacco use: Secondary | ICD-10-CM

## 2022-07-05 DIAGNOSIS — I2510 Atherosclerotic heart disease of native coronary artery without angina pectoris: Secondary | ICD-10-CM

## 2022-07-05 DIAGNOSIS — Z87898 Personal history of other specified conditions: Secondary | ICD-10-CM

## 2022-07-05 NOTE — Patient Instructions (Signed)
Medication Instructions:  Your physician recommends that you continue on your current medications as directed. Please refer to the Current Medication list given to you today.   *If you need a refill on your cardiac medications before your next appointment, please call your pharmacy*   Lab Work: Your physician recommends that you return for lab work in 6-8 weeks. Fasting Lipid Panel, LFTs  If you have labs (blood work) drawn today and your tests are completely normal, you will receive your results only by: MyChart Message (if you have MyChart) OR A paper copy in the mail If you have any lab test that is abnormal or we need to change your treatment, we will call you to review the results.   Testing/Procedures: NONE ordered at this time of appointment     Follow-Up: At [facility], you and your health needs are our priority.  As part of our continuing mission to provide you with exceptional heart care, we have created designated Provider Care Teams.  These Care Teams include your primary Cardiologist (physician) and Advanced Practice Providers (APPs -  Physician Assistants and Nurse Practitioners) who all work together to provide you with the care you need, when you need it.  We recommend signing up for the patient portal called "MyChart".  Sign up information is provided on this After Visit Summary.  MyChart is used to connect with patients for Virtual Visits (Telemedicine).  Patients are able to view lab/test results, encounter notes, upcoming appointments, etc.  Non-urgent messages can be sent to your provider as well.   To learn more about what you can do with MyChart, go to [email or web address].    Your next appointment:   6 month(s)  Provider:   Shelva Majestic, MD     Other Instructions

## 2022-07-05 NOTE — Progress Notes (Signed)
Office Visit    Patient Name: Natasha Roach Date of Encounter: 07/05/2022  Primary Care Provider:  Marliss Coots, NP Primary Cardiologist:  Shelva Majestic, MD  Chief Complaint    62 year old female with a history of CAD s/p STEMI, DES-LAD in 2017, DES-LAD in 10/2017,  PTCA-D1 in January 24, 2018, hypertension, hyperlipidemia, type 2 diabetes, cocaine abuse, and tobacco use who presents for follow-up related to CAD and hypertension.   Past Medical History    Past Medical History:  Diagnosis Date   CAD (coronary artery disease) 04/23/2016   a. suspected cocaine induced NSTEMI Jan 25, 2016; pt declined cath. b. STEMI 03/2016 due to cocaine, occ mLAD s/p Synergy stent, EF 40% by cath, 55% by echo.   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 at time of STEMI 03/2016.   Past Surgical History:  Procedure Laterality Date   ABDOMINAL HYSTERECTOMY     BREAST SURGERY     abcess   CARDIAC CATHETERIZATION  2004   CARDIAC CATHETERIZATION N/A 04/21/2016   Procedure: Left Heart Cath and Coronary Angiography;  Surgeon: Troy Sine, MD;  Location: [facility];  Service: Cardiovascular;  Laterality: N/A;   CARDIAC CATHETERIZATION N/A 04/21/2016   Procedure: Coronary Stent Intervention;  Surgeon: Troy Sine, MD;  Location: [place];  Service: Cardiovascular;  Laterality: N/A;   CORONARY STENT INTERVENTION N/A 10/27/2017   Procedure: CORONARY STENT INTERVENTION;  Surgeon: Martinique, Peter M, MD;  Location: [facility];  Service: Cardiovascular;  Laterality: N/A;   CORONARY/GRAFT ACUTE MI REVASCULARIZATION N/A 10/27/2017   Procedure:  Coronary/Graft Acute MI Revascularization;  Surgeon: Martinique, Peter M, MD;  Location: [facility];  Service: Cardiovascular;  Laterality: N/A;   CORONARY/GRAFT ACUTE MI REVASCULARIZATION N/A 01/16/2018   Procedure: Coronary/Graft Acute MI Revascularization;  Surgeon: Martinique, Peter M, MD;  Location: [facility];  Service: Cardiovascular;  Laterality: N/A;   ELECTROPHYSIOLOGIC STUDY N/A 04/21/2016   Procedure: Cardioversion;  Surgeon: Troy Sine, MD;  Location: [facility];  Service: Cardiovascular;  Laterality: N/A;   LEFT HEART CATH AND CORONARY ANGIOGRAPHY N/A 10/27/2017   Procedure: LEFT HEART CATH AND CORONARY ANGIOGRAPHY;  Surgeon: Martinique, Peter M, MD;  Location: [place];  Service: Cardiovascular;  Laterality: N/A;   LEFT HEART CATH AND CORONARY ANGIOGRAPHY N/A 01/16/2018   Procedure: LEFT HEART CATH AND CORONARY ANGIOGRAPHY;  Surgeon: Martinique, Peter M, MD;  Location: [facility];  Service: Cardiovascular;  Laterality: N/A;    Allergies  No Known Allergies   Labs/Other Studies Reviewed    The following studies were reviewed today: LHC 01-24-2018: Ost 1st Diag lesion is 90% stenosed. 1st Diag lesion is 100% stenosed. Previously placed Prox LAD-1 drug eluting stent is widely patent. Balloon angioplasty was performed. Prox LAD-2 lesion is 15% stenosed. There is mild left ventricular systolic dysfunction. LV end diastolic pressure is normal. The left ventricular ejection fraction is 45-50% by visual estimate. Post intervention, there is a 30% residual stenosis. Balloon angioplasty was performed using a BALLOON SAPPHIRE 3.5X10.  1. Single vessel obstructive CAD involving the origin of a large first diagonal branch with thrombus. 2. Continued patency of stents in the LAD 3. Mild LV dysfunction 4. Normal LVEDP 5. Successful POBA of the first diagonal.   Plan: will continue IV Aggrastat for 18 hours. Resume DAPT with ASA and Plavix. Check Echo.  Needs counseling for drug and tobacco cessation. Avoid beta blocker due to active cocaine use.   Recommend uninterrupted dual antiplatelet therapy with Aspirin '81mg'$  daily and Clopidogrel '75mg'$  daily for a minimum of 12 months (ACS - Class I recommendation).  Echo 12/2017: Study Conclusions   - Left ventricle: The cavity size was normal. There was mild    concentric hypertrophy. Systolic function was normal. The    estimated ejection fraction was in the range of 50% to 55%. Wall    motion was normal; there were no regional wall motion    abnormalities.  - Aorta: The aorta was mildly calcified with mobile density in the    aortic arch that could represent thrombus. Recommend Chest CTA    for further evaluation.  - Mitral valve: Mild focal calcification of the anterior leaflet.    There was trivial regurgitation.  - Pulmonary arteries: Systolic pressure could not be accurately    estimated.   Impressions:   - The aorta was mildly calcified with small mobile density in the    aortic arch.   Recent Labs: No results found for requested labs within last 365 days.  Recent Lipid Panel    Component Value Date/Time   CHOL 135 06/18/2018 1144   TRIG 150 (H) 06/18/2018 1144   HDL 49 06/18/2018 1144   CHOLHDL 2.8 06/18/2018 1144   CHOLHDL 4.0 01/17/2018 0212   VLDL 44 (H) 01/17/2018 0212   LDLCALC 56 06/18/2018 1144    History of Present Illness    62 year old female with the above past medical history including CAD s/p STEMI, DES-LAD in 2017, DES-LAD in 10/2017,  PTCA-D1 in 12/2017, hypertension, hyperlipidemia, type 2 diabetes, cocaine abuse, and tobacco use.  She suffered an MI in 2004 which was felt to be due to cocaine induced vasospasm.  She was hospitalized in December 2017 in the setting of STEMI secondary to total occlusion of the proximal LAD s/p DES.  She developed regional induced ventricular tachycardia, required cardioversion/defibrillation.  She was hospitalized again in July  2019 in the setting of severe chest pain following cocaine use.  She was found to have thrombotic lesion in the LAD just proximal to her prior stent s/p DES.  She ultimately left the hospital [place].  She was hospitalized again in September 2019 underwent repeat cardiac catheterization which revealed patent stents to LAD   30% stenosis of the diagonal branch s/p PTCA.  Echocardiogram at that time showed EF 50 to 55%, no RWMA.  She was last seen in the office on 04/07/2021 and was stable from a cardiac standpoint.  She denies recurrent chest pain.  She continued to smoke.  She presents today for follow-up.  Since her last visit overall from a cardiac standpoint.  She denies any symptoms concerning for angina.  She notes she has not been taking her atorvastatin regularly as she thought it was a calcium supplement.  She has been taking her aspirin and Plavix.  She continues to smoke.  Additionally, she continues to use crack cocaine, she notes she smoked up earlier this morning.  She states she was recently diagnosed with schizophrenia.  This has  been very difficult for her.  Otherwise, from a cardiac standpoint, she reports feeling well.     Home Medications    Current Outpatient Medications  Medication Sig Dispense Refill   aspirin 81 MG EC tablet Take 1 tablet (81 mg total) by mouth daily. 90 tablet 3   atorvastatin (LIPITOR) 80 MG tablet Take 1 tablet (80 mg total) by mouth every evening. 30 tablet 11   clopidogrel (PLAVIX) 75 MG tablet Take 1 tablet (75 mg total) by mouth daily. 90 tablet 0   nitroGLYCERIN (NITROSTAT) 0.4 MG SL tablet Place 1 tablet (0.4 mg total) under the tongue every 5 (five) minutes x 3 doses as needed for chest pain. 25 tablet 11   No current facility-administered medications for this visit.     Review of Systems    She denies chest pain, palpitations, dyspnea, pnd, orthopnea, n, v, dizziness, syncope, edema, weight gain, or early satiety. All other  systems reviewed and are otherwise negative except as noted above.   Physical Exam    VS:  BP 124/86 (BP Location: Right Arm, Patient Position: Sitting, Cuff Size: Normal)   Pulse 66   Ht '5\' 7"'$  (1.702 m)   Wt 175 lb (79.4 kg)   BMI 27.41 kg/m  GEN: Well nourished, well developed, in no acute distress. HEENT: normal. Neck: Supple, no JVD, carotid bruits, or masses. Cardiac: RRR, no murmurs, rubs, or gallops. No clubbing, cyanosis, edema.  Radials/DP/PT 2+ and equal bilaterally.  Respiratory:  Respirations regular and unlabored, clear to auscultation bilaterally. GI: Soft, nontender, nondistended, BS + x 4. MS: no deformity or atrophy. Skin: warm and dry, no rash. Neuro:  Strength and sensation are intact. Psych: Normal affect.  Accessory Clinical Findings    ECG personally reviewed by me today -SR, 66 bpm- no acute changes.   Lab Results  Component Value Date   WBC 13.5 (H) 06/18/2018   HGB 14.6 06/18/2018   HCT 43.9 06/18/2018   MCV 84 06/18/2018   PLT 266 06/18/2018   Lab Results  Component Value Date   CREATININE 0.64 06/18/2018   BUN 10 06/18/2018   NA 144 06/18/2018   K 4.8 06/18/2018   CL 108 (H) 06/18/2018   CO2 22 06/18/2018   Lab Results  Component Value Date   ALT 20 06/18/2018   AST 15 06/18/2018   ALKPHOS 113 06/18/2018   BILITOT 0.4 06/18/2018   Lab Results  Component Value Date   CHOL 135 06/18/2018   HDL 49 06/18/2018   LDLCALC 56 06/18/2018   TRIG 150 (H) 06/18/2018   CHOLHDL 2.8 06/18/2018    Lab Results  Component Value Date   HGBA1C 6.9 (H) 01/16/2018    Assessment & Plan    1. CAD:  S/p STEMI, DES-LAD in 2017, DES-LAD in 10/2017,  PTCA-D1 in 12/2017. Stable with no anginal symptoms. She states she was not aware that she had a total of 2 stents to her LAD (patient does not remember her hospitalization from July 2019).  This comes as a bit of a shock to her today.  Continue aspirin, Plavix, Lipitor.  2. Hypertension: BP well controlled.  Continue current antihypertensive regimen.   3. Hyperlipidemia: No recent LDL on file.  He has not been taking her atorvastatin.  Encouraged adherence to medication regimen.  Will plan for repeat fasting lipids, LFTs in 6 to 8 weeks.  4. Type 2 diabetes: No recent A1c on file. Monitored and managed per PCP.   5. Cocaine/Tobacco  use: Unfortunately, she continues to use crack cocaine and smokes approximately 1 pack/day.  Full cessation advised.   6. Disposition: Follow-up in 6 months.       Lenna Sciara, NP 07/05/2022, 10:49 AM

## 2022-09-26 ENCOUNTER — Other Ambulatory Visit: Payer: Self-pay

## 2022-09-26 ENCOUNTER — Telehealth: Payer: Self-pay

## 2022-09-26 MED ORDER — CLOPIDOGREL BISULFATE 75 MG PO TABS: 75.0000 mg | ORAL_TABLET | Freq: Every day | ORAL | 0 refills | Status: DC

## 2022-09-26 MED ORDER — ATORVASTATIN CALCIUM 80 MG PO TABS: 80.0000 mg | ORAL_TABLET | Freq: Every evening | ORAL | 3 refills | Status: DC

## 2022-09-26 MED ORDER — NITROGLYCERIN 0.4 MG SL SUBL: 0.4000 mg | SUBLINGUAL_TABLET | SUBLINGUAL | 3 refills | Status: DC | PRN

## 2022-09-26 NOTE — Progress Notes (Deleted)
Please advise on Plavix.  Patient just seen in office

## 2022-09-26 NOTE — Telephone Encounter (Signed)
Please advise on Plavix dosage.  Patient seen in office 3/12

## 2022-09-26 NOTE — Telephone Encounter (Signed)
Patient's dose is correct. 75mg  once daily

## 2022-10-04 ENCOUNTER — Telehealth: Payer: Self-pay | Admitting: Cardiovascular Disease

## 2022-10-04 MED ORDER — NITROGLYCERIN 0.4 MG SL SUBL: 0.4000 mg | SUBLINGUAL_TABLET | SUBLINGUAL | 2 refills | Status: DC | PRN

## 2022-10-04 MED ORDER — ATORVASTATIN CALCIUM 80 MG PO TABS: 80.0000 mg | ORAL_TABLET | Freq: Every evening | ORAL | 2 refills | Status: DC

## 2022-10-04 NOTE — Telephone Encounter (Signed)
Pt's medications were sent to pt's pharmacy as requested. Confirmation received.  

## 2022-10-04 NOTE — Telephone Encounter (Signed)
*  STAT* If patient is at the pharmacy, call can be transferred to refill team.   1. Which medications need to be refilled? (please list name of each medication and dose if known) atorvastatin (LIPITOR) 80 MG tablet   nitroGLYCERIN (NITROSTAT) 0.4 MG SL tablet   2. Which pharmacy/location (including street and city if local pharmacy) is medication to be sent to? [id number] - [place], [place] - [address]   3. Do they need a 30 day or 90 day supply? 90

## 2022-10-16 DIAGNOSIS — H5213 Myopia, bilateral: Secondary | ICD-10-CM | POA: Diagnosis not present

## 2022-11-01 DIAGNOSIS — E119 Type 2 diabetes mellitus without complications: Secondary | ICD-10-CM | POA: Diagnosis not present

## 2022-11-01 DIAGNOSIS — F1491 Cocaine use, unspecified, in remission: Secondary | ICD-10-CM | POA: Diagnosis not present

## 2022-11-01 DIAGNOSIS — R062 Wheezing: Secondary | ICD-10-CM | POA: Diagnosis not present

## 2022-11-01 DIAGNOSIS — I2510 Atherosclerotic heart disease of native coronary artery without angina pectoris: Secondary | ICD-10-CM | POA: Diagnosis not present

## 2022-11-01 DIAGNOSIS — Z136 Encounter for screening for cardiovascular disorders: Secondary | ICD-10-CM | POA: Diagnosis not present

## 2022-11-01 DIAGNOSIS — I252 Old myocardial infarction: Secondary | ICD-10-CM | POA: Diagnosis not present

## 2022-11-01 DIAGNOSIS — Z720 Tobacco use: Secondary | ICD-10-CM | POA: Diagnosis not present

## 2022-11-01 DIAGNOSIS — E782 Mixed hyperlipidemia: Secondary | ICD-10-CM | POA: Diagnosis not present

## 2022-11-01 DIAGNOSIS — Z91148 Patient's other noncompliance with medication regimen for other reason: Secondary | ICD-10-CM | POA: Diagnosis not present

## 2022-11-01 DIAGNOSIS — Z0001 Encounter for general adult medical examination with abnormal findings: Secondary | ICD-10-CM | POA: Diagnosis not present

## 2022-11-01 DIAGNOSIS — Z8679 Personal history of other diseases of the circulatory system: Secondary | ICD-10-CM | POA: Diagnosis not present

## 2022-11-01 DIAGNOSIS — J4489 Other specified chronic obstructive pulmonary disease: Secondary | ICD-10-CM | POA: Diagnosis not present

## 2022-11-01 DIAGNOSIS — R9431 Abnormal electrocardiogram [ECG] [EKG]: Secondary | ICD-10-CM | POA: Diagnosis not present

## 2022-11-01 DIAGNOSIS — I10 Essential (primary) hypertension: Secondary | ICD-10-CM | POA: Diagnosis not present

## 2022-11-04 DIAGNOSIS — F99 Mental disorder, not otherwise specified: Secondary | ICD-10-CM | POA: Diagnosis not present

## 2022-11-05 ENCOUNTER — Ambulatory Visit (HOSPITAL_COMMUNITY)
Admission: EM | Admit: 2022-11-05 | Discharge: 2022-11-05 | Disposition: A | Discharge status: Home / Self Care | Payer: Medicaid Other

## 2022-11-05 NOTE — Discharge Instructions (Addendum)

## 2022-11-05 NOTE — Progress Notes (Signed)
   11/05/22 1018  [facility] Triage Screening (Walk-ins at [facility] only)  How Did You Hear About [place]? Family/Friend  What Is the Reason for Your Visit/Call Today? Pt presents to [facility] voluntarily unaccompanied seeking resources substance use treatment for cocaine use. Pt reports a medical Hx of heart attacks.  Pt states she is looking to go into a facility next week for substance use treatment. Pt denies SI/HI and AVH.  How Long Has This Been Causing You Problems? > than 6 months  Have You Recently Had Any Thoughts About Hurting Yourself? No  Are You Planning to Commit Suicide/Harm Yourself At This time? No  Have you Recently Had Thoughts About Hurting Someone Karolee Ohs? No  Are You Planning To Harm Someone At This Time? No  Are you currently experiencing any auditory, visual or other hallucinations? No  Have You Used Any Alcohol or Drugs in the Past 24 Hours? Yes  How long ago did you use Drugs or Alcohol? today  What Did You Use and How Much? cocaine, "a couple hits"  Do you have any current medical co-morbidities that require immediate attention? No  Clinician description of patient physical appearance/behavior: anxious, cooperative  What Do You Feel Would Help You the Most Today? Alcohol or Drug Use Treatment  If access to [facility] was not available, would you have sought care in the Emergency Department? No  Determination of Need Routine (7 days)  Options For Referral Outpatient Therapy;Medication Management

## 2022-11-22 DIAGNOSIS — J4489 Other specified chronic obstructive pulmonary disease: Secondary | ICD-10-CM | POA: Diagnosis not present

## 2022-11-22 DIAGNOSIS — E119 Type 2 diabetes mellitus without complications: Secondary | ICD-10-CM | POA: Diagnosis not present

## 2022-11-22 DIAGNOSIS — E782 Mixed hyperlipidemia: Secondary | ICD-10-CM | POA: Diagnosis not present

## 2022-11-22 DIAGNOSIS — I2510 Atherosclerotic heart disease of native coronary artery without angina pectoris: Secondary | ICD-10-CM | POA: Diagnosis not present

## 2022-11-22 DIAGNOSIS — I10 Essential (primary) hypertension: Secondary | ICD-10-CM | POA: Diagnosis not present

## 2022-11-22 DIAGNOSIS — Z720 Tobacco use: Secondary | ICD-10-CM | POA: Diagnosis not present

## 2023-01-10 DIAGNOSIS — J4489 Other specified chronic obstructive pulmonary disease: Secondary | ICD-10-CM | POA: Diagnosis not present

## 2023-01-10 DIAGNOSIS — Z720 Tobacco use: Secondary | ICD-10-CM | POA: Diagnosis not present

## 2023-01-10 DIAGNOSIS — I2510 Atherosclerotic heart disease of native coronary artery without angina pectoris: Secondary | ICD-10-CM | POA: Diagnosis not present

## 2023-01-10 DIAGNOSIS — I10 Essential (primary) hypertension: Secondary | ICD-10-CM | POA: Diagnosis not present

## 2023-01-10 DIAGNOSIS — M25561 Pain in right knee: Secondary | ICD-10-CM | POA: Diagnosis not present

## 2023-01-10 DIAGNOSIS — E119 Type 2 diabetes mellitus without complications: Secondary | ICD-10-CM | POA: Diagnosis not present

## 2023-01-10 DIAGNOSIS — E782 Mixed hyperlipidemia: Secondary | ICD-10-CM | POA: Diagnosis not present

## 2023-01-12 ENCOUNTER — Ambulatory Visit: Payer: Medicaid Other | Attending: Cardiovascular Disease | Admitting: Cardiovascular Disease

## 2023-02-12 NOTE — Progress Notes (Deleted)
Cardiology Office Note    Date:  02/12/2023   ID:  NOVI BELD, DOB 16-Apr-1961, MRN [id number]  PCP:  Lavinia Sharps, NP  Cardiologist:  Nicki Guadalajara, MD   22 month F/U office visit with me  History of Present Illness:  Natasha Roach is a 62 y.o. female who presents for a 22 month follow-up office evaluation.    Natasha Roach has a history of tobacco use, hypertension, diabetes mellitus and cocaine use.  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  I saw her on April 21, 2016 when she presented with 10 out of 10 chest pain shortly after using crack cocaine.  She was found to have acute ST segment elevation MI secondary to total occlusion of the proximal LAD with initial TIMI 0 flow and no evidence for collateralization.  She had normal circumflex and small right coronary artery.  She developed reperfusion induced ventricular tachycardia requiring cardioversion/defibrillation and amiodarone bolus.  Ultimately her LAD was successfully stented with a 3.5 x 20 mm DES stent postdilated 3.78 mm with resumption of TIMI-3 flow in a very large LAD vessel.  I had not seen the patient since that presentation.  Apparently, over the past several years she had recurrent unfortunate issues with repeat chest pain.  She represented in July 2019 again presenting with severe chest pain following cocaine use.  She was found to have a thrombotic lesion in the LAD just proximal to her prior stent which was successfully intervened upon with insertion of a DES stent.  Underwent PCI to her LAD.  She ultimately signed out AGAINST MEDICAL ADVICE.  She represented again in September and underwent repeat catheterization on January 16, 2018 by Dr. Swaziland.  Her LAD stent was patent but she had a 90% stenosis in the diagonal branch which was treated with PTCA and not stented.  An echo Doppler study from January 16, 2018 showed an EF of 50 to 55% without regional wall  motion abnormalities.  The aorta was mildly calcified with a questionable density in the aortic arch since her last event, she is now been on Plavix regularly previously had run out of her medication leading to recurrent events.  Since her most recent intervention, she has not experienced any recurrent chest pain.  Unfortunately she still smokes cigarettes.  She is on atorvastatin 80 mg for hyperlipidemia, aspirin and Plavix.    When I saw her on October 5 18th 2019 I had a very lengthy discussion with her and stressed the importance of significant lifestyle adjustment, smoking cessation, discontinuance of drugs, during that discussion she finally seem to understand the importance and made a major effort in improving her lifestyle.   When I saw her in February 2020, she was extremely gratified for the time that I had spent with her and became very tearful that that discussion was of major impact in her significant lifestyle adjustment.  She had dramatically reduced drug use but unfortunately still continues to smoke.  She was 100% percent compliant with taking her medications.  Her echo Doppler study in September 2019 showed an EF of 50 to 55% with normal wall motion and was without any regional wall motion abnormalities suggesting complete salvage of myocardium despite her prior event.  She continued to have some issues with her spinal stenosis which limited her exercise.  I saw her in October 2020 at which time she continued to be very grateful and remained  stable from a cardiovascular standpoint.  [facility]  The patient denies any recent drug use.  She continues to be on aspirin and Plavix as well as atorvastatin 80 mg daily.  She was not having any anginal symptoms or exertional dyspnea.    I saw her on March 11, 2020 the prior year she had continued to do well.  At that time, she was still walking 1 to 1-1/2 packs of cigarettes per  day.  She continued to try to avoid cocaine but on several instances did use cocaine intermittently.  She had not been taking her atorvastatin.  During that evaluation her blood pressure was mildly elevated and I discussed most recent hypertensive guidelines with target blood pressure less than 130/80.  I last saw her on April 07, 2021 at which time she denied any  recurrent chest pain or shortness of breath.  She continues to smoke 1 pack of cigarettes per day.  There has been rare use of cocaine over the past year.  Her daughter has end-stage renal disease on dialysis and will need heart surgery.  She has not had recent laboratory and I recommended a complete set of fasting laboratory.  She was last seen by Liberty Handy on July 05, 2022 ...  Since I last saw her  Past Medical History:  Diagnosis Date   CAD (coronary artery disease) 04/23/2016   a. suspected cocaine induced NSTEMI 12/2015; pt declined cath. b. STEMI 03/2016 due to cocaine, occ mLAD s/p Synergy stent, EF 40% by cath, 55% by echo.   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 at time of STEMI 03/2016.    Past Surgical History:  Procedure Laterality Date   ABDOMINAL HYSTERECTOMY     BREAST SURGERY     abcess   CARDIAC CATHETERIZATION  2004   CARDIAC CATHETERIZATION N/A 04/21/2016   Procedure: Left Heart Cath and Coronary Angiography;  Surgeon: Lennette Bihari, MD;  Location: [facility] INVASIVE CV LAB;  Service: Cardiovascular;  Laterality: N/A;   CARDIAC CATHETERIZATION N/A 04/21/2016   Procedure: Coronary Stent Intervention;  Surgeon: Lennette Bihari, MD;  Location: [facility] INVASIVE CV LAB;   Service: Cardiovascular;  Laterality: N/A;   CORONARY STENT INTERVENTION N/A 10/27/2017   Procedure: CORONARY STENT INTERVENTION;  Surgeon: Swaziland, Peter M, MD;  Location: [facility] INVASIVE CV LAB;  Service: Cardiovascular;  Laterality: N/A;   CORONARY/GRAFT ACUTE MI REVASCULARIZATION N/A 10/27/2017   Procedure: Coronary/Graft Acute MI Revascularization;  Surgeon: Swaziland, Peter M, MD;  Location: [facility] INVASIVE CV LAB;  Service: Cardiovascular;  Laterality: N/A;   CORONARY/GRAFT ACUTE MI REVASCULARIZATION N/A 01/16/2018   Procedure: Coronary/Graft Acute MI Revascularization;  Surgeon: Swaziland, Peter M, MD;  Location: [facility] INVASIVE CV LAB;  Service: Cardiovascular;  Laterality: N/A;   ELECTROPHYSIOLOGIC STUDY N/A 04/21/2016   Procedure: Cardioversion;  Surgeon: Lennette Bihari, MD;  Location: [facility] INVASIVE CV LAB;  Service: Cardiovascular;  Laterality: N/A;   LEFT HEART CATH AND CORONARY ANGIOGRAPHY N/A 10/27/2017   Procedure: LEFT HEART CATH AND CORONARY ANGIOGRAPHY;  Surgeon: Swaziland, Peter M, MD;  Location: [facility] INVASIVE CV LAB;  Service: Cardiovascular;  Laterality: N/A;   LEFT HEART CATH AND CORONARY ANGIOGRAPHY N/A 01/16/2018   Procedure: LEFT HEART CATH AND CORONARY ANGIOGRAPHY;  Surgeon: Swaziland, Peter M, MD;  Location: [facility] INVASIVE CV LAB;  Service: Cardiovascular;  Laterality: N/A;    Current Medications: Outpatient Medications Prior to Visit  Medication Sig Dispense Refill   aspirin 81 MG EC tablet Take 1 tablet (81 mg total) by mouth daily. 90 tablet 3   atorvastatin (LIPITOR) 80 MG tablet Take 1 tablet (80 mg total) by mouth every evening. 90 tablet 2   clopidogrel (PLAVIX) 75 MG tablet Take 1 tablet (75 mg total) by mouth daily. 90 tablet 0   nitroGLYCERIN (NITROSTAT) 0.4 MG SL tablet Place 1 tablet (0.4 mg total) under the tongue every 5 (five) minutes x 3 doses as needed for chest pain. 75 tablet 2   No facility-administered medications prior to visit.     Allergies:   Patient has no known allergies.    Social History   Socioeconomic History   Marital status: Significant Other    Spouse name: Not on file   Number of children: Not on file   Years of education: Not on file   Highest education level: Not on file  Occupational History   Not on file  Tobacco Use   Smoking status: Every Day    Current packs/day: 1.00    Average packs/day: 1 pack/day for 40.0 years (40.0 ttl pk-yrs)    Types: Cigarettes   Smokeless tobacco: Never  Vaping Use   Vaping status: Never Used  Substance and Sexual Activity   Alcohol use: No   Drug use: Yes    Types: Cocaine    Comment: last use 01/16/18   Sexual activity: Yes  Other Topics Concern   Not on file  Social History Narrative   Not on file   Social Determinants of Health   Financial Resource Strain: Low Risk  (01/17/2018)   Overall Financial Resource Strain (CARDIA)    Difficulty of Paying Living Expenses: Not hard at all  Food Insecurity: No Food Insecurity (01/17/2018)   Hunger Vital Sign    Worried About Running Out of Food in the Last Year: Never true    Ran Out of Food in the Last Year: Never true  Transportation Needs: Unknown (01/17/2018)   PRAPARE - Transportation    Lack of Transportation (Medical): Patient declined    Lack of Transportation (Non-Medical): Patient declined  Physical Activity: Unknown (01/17/2018)   Exercise Vital Sign    Days of Exercise per Week: Patient declined    Minutes of Exercise per Session: Patient declined  Stress: Stress Concern Present (01/17/2018)   [employer] of Occupational Health - Occupational Stress Questionnaire    Feeling of Stress : To some extent  Social Connections: Unknown (09/07/2021)   [facility]   Social Network    Social Network: Not on file     Family History:  The patient's  family history includes CAD in her father.   ROS General: Negative; No fevers, chills, or night sweats;  HEENT: Negative; No changes in vision or hearing, sinus  congestion, difficulty swallowing Pulmonary: Negative; No cough, wheezing, shortness of breath, hemoptysis Cardiovascular: Negative; No chest pain, presyncope, syncope, palpitations GI: Negative; No nausea, vomiting, diarrhea, or abdominal pain GU: Negative; No dysuria, hematuria, or difficulty voiding Musculoskeletal: Negative; no myalgias, joint pain, or weakness Hematologic/Oncology:  Negative; no easy bruising, bleeding Endocrine: Negative; no heat/cold intolerance; no diabetes Neuro: Negative; no changes in balance, headaches Skin: Negative; No rashes or skin lesions Psychiatric: Negative; No behavioral problems, depression Sleep: Negative; No snoring, daytime sleepiness, hypersomnolence, bruxism, restless legs, hypnogognic hallucinations, no cataplexy Other comprehensive 14 point system review is negative.   PHYSICAL EXAM:   VS:  There were no vitals taken for this visit.    Repeat blood pressure by me was 118 over  Wt Readings from Last 3 Encounters:  07/05/22 175 lb (79.4 kg)  04/07/21 178 lb 6.4 oz (80.9 kg)  03/11/20 179 lb (81.2 kg)     General: Alert, oriented, no distress.  Skin: normal turgor, no rashes, warm and dry HEENT: Normocephalic, atraumatic. Pupils equal round and reactive to light; sclera anicteric; extraocular muscles intact; Fundi ** Nose without nasal septal hypertrophy Mouth/Parynx benign; Mallinpatti scale Neck: No JVD, no carotid bruits; normal carotid upstroke Lungs: clear to ausculatation and percussion; no wheezing or rales Chest wall: without tenderness to palpitation Heart: PMI not displaced, RRR, s1 s2 normal, 1/6 systolic murmur, no diastolic murmur, no rubs, gallops, thrills, or heaves Abdomen: soft, nontender; no hepatosplenomehaly, BS+; abdominal aorta nontender and not dilated by palpation. Back: no CVA tenderness Pulses 2+ Musculoskeletal: full range of motion, normal strength, no joint deformities Extremities: no clubbing cyanosis or  edema, Homan's sign negative  Neurologic: grossly nonfocal; Cranial nerves grossly wnl Psychologic: Normal mood and affect    General: Alert, oriented, no distress.  Skin: normal turgor, no rashes, warm and dry HEENT: Normocephalic, atraumatic. Pupils equal round and reactive to light; sclera anicteric; extraocular muscles intact;  Nose without nasal septal hypertrophy Mouth/Parynx benign; Mallinpatti scale 3 Neck: No JVD, no carotid bruits; normal carotid upstroke Lungs: clear to ausculatation and percussion; no wheezing or rales Chest wall: without tenderness to palpitation Heart: PMI not displaced, RRR, s1 s2 normal, 1/6 systolic murmur, no diastolic murmur, no rubs, gallops, thrills, or heaves Abdomen: soft, nontender; no hepatosplenomehaly, BS+; abdominal aorta nontender and not dilated by palpation. Back: no CVA tenderness Pulses 2+ Musculoskeletal: full range of motion, normal strength, no joint deformities Extremities: no clubbing cyanosis or edema, Homan's sign negative  Neurologic: grossly nonfocal; Cranial nerves grossly wnl Psychologic: Normal mood and affect   Studies/Labs Reviewed:   April 07, 2021 ECG (independently read by me): NSR at 72, mild inferolateral T wave abnormality  March 11, 2020 EKG:  EKG is ordered today.  Normal sinus rhythm at 63 bpm, previously noted mild inferolateral T wave inversion.  No ectopy.  Normal intervals  October 2020 ECG (independently read by me): Sinus rhythm at 60 bpm.  No ectopy.  Normal intervals.  Minimal RV conduction delay  February 2020 ECG (independently read by me):Sinus Bradycardia 59; Mild T wave abnormality inferolaterally; Nl intervals  February 06, 2018 ECG (independently read by me): Normal sinus rhythm at 74 bpm.  Mild T wave abnormality inferolaterally  Recent Labs:    Latest Ref Rng & Units 06/18/2018   11:44 AM 01/17/2018    7:52 AM 01/16/2018   12:33 PM  BMP  Glucose 65 - 99 mg/dL 409  811  914   BUN 6 -  24 mg/dL 10  10  11    Creatinine 0.57 - 1.00 mg/dL 7.82  9.56  2.13   BUN/Creat Ratio 9 - 23 16     Sodium 134 - 144 mmol/L 144  139  139   Potassium 3.5 - 5.2 mmol/L 4.8  4.1  4.0   Chloride 96 - 106 mmol/L 108  109  107   CO2 20 - 29 mmol/L 22  19    Calcium 8.7 - 10.2 mg/dL 9.3  8.7          Latest Ref Rng & Units 06/18/2018   11:44 AM 01/16/2018   12:00 PM 10/27/2017    3:56 PM  Hepatic Function  Total Protein 6.0 - 8.5 g/dL 5.8  5.1  4.8   Albumin 3.8 - 4.9 g/dL 4.2  3.1  3.2   AST 0 - 40 IU/L 15  16  15    ALT 0 - 32 IU/L 20  18  15    Alk Phosphatase 39 - 117 IU/L 113  74  86   Total Bilirubin 0.0 - 1.2 mg/dL 0.4  0.7  0.6        Latest Ref Rng & Units 06/18/2018   11:44 AM 01/17/2018    7:52 AM 01/16/2018   12:33 PM  CBC  WBC 3.4 - 10.8 x10E3/uL 13.5  16.6    Hemoglobin 11.1 - 15.9 g/dL 09.8  11.9  14.7   Hematocrit 34.0 - 46.6 % 43.9  45.3  45.0   Platelets 150 - 450 x10E3/uL 266  229     Lab Results  Component Value Date   MCV 84 06/18/2018   MCV 85.2 01/17/2018   MCV 86.1 01/16/2018   Lab Results  Component Value Date   TSH 0.963 06/18/2018   Lab Results  Component Value Date   HGBA1C 6.9 (H) 01/16/2018     BNP No results found for: "BNP"  ProBNP No results found for: "PROBNP"   Lipid Panel     Component Value Date/Time   CHOL 135 06/18/2018 1144   TRIG 150 (H) 06/18/2018 1144   HDL 49 06/18/2018 1144   CHOLHDL 2.8 06/18/2018 1144   CHOLHDL 4.0 01/17/2018 0212   VLDL 44 (H) 01/17/2018 0212   LDLCALC 56 06/18/2018 1144     RADIOLOGY: No results found.   Additional studies/ records that were reviewed today include:  Reviewed the patient's hospitalizations, cardiac catheterizations and interventions, laboratory, echo Doppler and CT assessments.  ASSESSMENT:    No diagnosis found.   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  Remarkably, despite at least 4 acute catheterizations, her LV function has remained essentially normal without regional wall motion abnormalities.  When I saw her in October 2019 after not having seen her since 2017 I spent considerable time with her and discussed the absolute importance of major lifestyle change.  During that evaluation, she seemed to "connect " with me and became very tearful and states that that discussion completely changed her life.  Since that time she made major lifestyle change.  She [facility]  She continues to be on DAPT long-term.  He has previously noted T wave abnormality on ECG.  Presently, her blood pressure is stable and improved; she is not on antihypertensive medication.  She continues to smoke 1 pack of cigarettes per day and to rare use of cocaine.  I again discussed the absolute importance of complete cessation.  Remotely she had been on atorvastatin but has not been taking this.  She has not had recent laboratory.  I am  checking a comprehensive metabolic panel TSH CBC and fasting lipid studies.  I will contact her regarding her laboratory results and recommendations will be made if necessary.  Otherwise as long as she remains stable I see her in 1 year for follow-up evaluation.    Medication Adjustments/Labs and Tests Ordered: Current medicines are reviewed at length with the patient today.  Concerns regarding medicines are outlined above.  Medication changes, Labs and Tests ordered today are listed in the Patient Instructions below. There are no Patient Instructions on file for this visit.   Signed, Nicki Guadalajara, MD  02/12/2023 11:50 AM    [facility] [address], [place], [place]  [address] Phone: [phone]

## 2023-02-13 ENCOUNTER — Ambulatory Visit: Payer: Medicaid Other | Admitting: Cardiovascular Disease

## 2023-03-15 DIAGNOSIS — M1711 Unilateral primary osteoarthritis, right knee: Secondary | ICD-10-CM | POA: Diagnosis not present

## 2023-03-15 DIAGNOSIS — E119 Type 2 diabetes mellitus without complications: Secondary | ICD-10-CM | POA: Diagnosis not present

## 2023-03-15 DIAGNOSIS — E782 Mixed hyperlipidemia: Secondary | ICD-10-CM | POA: Diagnosis not present

## 2023-03-15 DIAGNOSIS — J4489 Other specified chronic obstructive pulmonary disease: Secondary | ICD-10-CM | POA: Diagnosis not present

## 2023-03-15 DIAGNOSIS — I2510 Atherosclerotic heart disease of native coronary artery without angina pectoris: Secondary | ICD-10-CM | POA: Diagnosis not present

## 2023-03-15 DIAGNOSIS — D751 Secondary polycythemia: Secondary | ICD-10-CM | POA: Diagnosis not present

## 2023-03-15 DIAGNOSIS — Z720 Tobacco use: Secondary | ICD-10-CM | POA: Diagnosis not present

## 2023-03-15 DIAGNOSIS — I10 Essential (primary) hypertension: Secondary | ICD-10-CM | POA: Diagnosis not present

## 2023-04-03 DIAGNOSIS — K047 Periapical abscess without sinus: Secondary | ICD-10-CM | POA: Diagnosis not present

## 2023-04-12 DIAGNOSIS — E782 Mixed hyperlipidemia: Secondary | ICD-10-CM | POA: Diagnosis not present

## 2023-04-12 DIAGNOSIS — D751 Secondary polycythemia: Secondary | ICD-10-CM | POA: Diagnosis not present

## 2023-04-12 DIAGNOSIS — M1711 Unilateral primary osteoarthritis, right knee: Secondary | ICD-10-CM | POA: Diagnosis not present

## 2023-04-12 DIAGNOSIS — E119 Type 2 diabetes mellitus without complications: Secondary | ICD-10-CM | POA: Diagnosis not present

## 2023-04-12 DIAGNOSIS — I10 Essential (primary) hypertension: Secondary | ICD-10-CM | POA: Diagnosis not present

## 2023-04-12 DIAGNOSIS — Z720 Tobacco use: Secondary | ICD-10-CM | POA: Diagnosis not present

## 2023-04-12 DIAGNOSIS — I2510 Atherosclerotic heart disease of native coronary artery without angina pectoris: Secondary | ICD-10-CM | POA: Diagnosis not present

## 2023-04-12 DIAGNOSIS — J4489 Other specified chronic obstructive pulmonary disease: Secondary | ICD-10-CM | POA: Diagnosis not present

## 2023-05-05 ENCOUNTER — Ambulatory Visit: Payer: Medicaid Other | Admitting: Cardiovascular Disease

## 2023-05-24 ENCOUNTER — Ambulatory Visit (HOSPITAL_COMMUNITY)
Admission: EM | Admit: 2023-05-24 | Discharge: 2023-05-24 | Disposition: A | Discharge status: Home / Self Care | Payer: Medicaid Other | Attending: Psychiatry | Admitting: Psychiatry

## 2023-05-24 DIAGNOSIS — F209 Schizophrenia, unspecified: Secondary | ICD-10-CM | POA: Insufficient documentation

## 2023-05-24 DIAGNOSIS — F1510 Other stimulant abuse, uncomplicated: Secondary | ICD-10-CM | POA: Insufficient documentation

## 2023-05-24 NOTE — ED Provider Notes (Signed)
[facility] Medical Screening Exam  Patient Name: [name]  MRN: [id number]  Date of Evaluation: 05/24/23  Chief Complaint: Worsening paranoid ideations & crack cocaine use.   Diagnosis: Stimulant use disorder.  Hx. Schizophrenia. Final diagnoses:  None    History of Present illness: Natasha Roach is a 63 y.o. AA female with hx of schizophrenia & substance use disorder. Patient voluntarily arrived at the [facility] with complain of increased cocaine use & paranoid ideations. She reports,   "I have been doing crack cocaine for 38 years. I feel alone as a result. I do not have any social life. I do not like some of the thoughts that goes on in my head. I do not like to watch TV because all I see on TV is violence, violence & violence. After seeing all those violence, I will think about getting a gun. But no one is after me. I was diagnosed with schizophrenia over one year ago, but I'm not on any medicines. What I need is a place to go & talk to someone & clear my mind. I have bundled-in a lot of stuff. I do not need to go to the hospital. I'm not thinking about hurting myself or anyone else. I'm not hearing any voices or seeing things other people are unable to hear of see. It is just me. I was once given some medicine for sleep  when I was going to the [facility] for some help. I only took that medicine twice. I stopped taken it because I did not like how it made me feel. The reason I came in today is because I woke-up this morning in a state of anger about my drug use. I said to myself that I need to do something about it. And that is the reason I'm here. I need a therapist. I need therapy for my cocaine use".   Flowsheet Row ED from 05/24/2023 in [facility] ED from 11/05/2022 in [facility]  C-SSRS RISK CATEGORY No Risk No Risk       Psychiatric Specialty Exam  Presentation  General  Appearance:Appropriate for Environment; Casual; Fairly Groomed  Eye Contact:Good  Speech:Clear and Coherent  Speech Volume:Normal  Handedness:Right   Mood and Affect  Mood:-- ("I'm sad that I can stop using crack cocaine.)  Affect:Congruent; Tearful   Thought Process  Thought Processes:Coherent; Goal Directed; Linear  Descriptions of Associations:Intact  Orientation:Full (Time, Place and Person)  Thought Content:Logical    Hallucinations:None  Ideas of Reference:None  Suicidal Thoughts:No  Homicidal Thoughts:No   Sensorium  Memory:Immediate Good; Recent Good; Remote Good  Judgment:Fair  Insight:Fair   Executive Functions  Concentration:Good  Attention Span:Good  Recall:Good  Fund of Knowledge:Fair  Language:Good   Psychomotor Activity  Psychomotor Activity:Normal   Assets  Assets:Communication Skills; Desire for Improvement; Housing; Resilience; Social Support   Sleep  Sleep:Good  Number of hours: 8   Physical Exam: Physical Exam Vitals and nursing note reviewed.  Neck:     Comments: Wears glasses. Cardiovascular:     Rate and Rhythm: Normal rate.     Pulses: Normal pulses.     Comments: Hx. Heart condition.  Pulmonary:     Effort: Pulmonary effort is normal.  Musculoskeletal:        General: Normal range of motion.  Skin:    General: Skin is dry.  Neurological:     General: No focal deficit present.     Mental Status:  She is alert and oriented to person, place, and time. Mental status is at baseline.    Review of Systems  Constitutional:  Negative for chills and fever.  Respiratory:  Negative for cough, shortness of breath and wheezing.   Cardiovascular:  Negative for chest pain and palpitations.       Hx. Heart condition.   Gastrointestinal:  Negative for abdominal pain, constipation, diarrhea, heartburn, nausea and vomiting.  Musculoskeletal:  Negative for joint pain and myalgias.  Neurological:  Negative for dizziness,  tingling, tremors, sensory change, speech change, focal weakness, seizures, loss of consciousness, weakness and headaches.  Psychiatric/Behavioral:  Positive for hallucinations (Reports paranoid ideations.) and substance abuse (Hx cocaine use disorder.). Negative for depression, memory loss and suicidal ideas. The patient is not nervous/anxious and does not have insomnia.    There were no vitals taken for this visit. There is no height or weight on file to calculate BMI.  Musculoskeletal: Strength & Muscle Tone: within normal limits Gait & Station: normal Patient leans: N/A  [facility] MSE Discharge Disposition for Follow up and Recommendations: Based on my evaluation the patient does not appear to have an emergency medical condition and can be discharged with resources and follow up care in outpatient services for Substance Abuse Intensive Outpatient Program  Armandina Stammer, NP, pmhnp, fnp-bc. 05/24/2023, 2:59 PM

## 2023-05-24 NOTE — Progress Notes (Signed)
   05/24/23 1346  [place] Suicide Severity Rating Scale  1. In the past month - "Have you wished you were dead or wished you could go to sleep and not wake up?" No  2. In the past month - "Have you actually had any thoughts of killing yourself?" No  6. Have you ever done anything, started to do anything, or prepared to do anything to end your life?" No  C-SSRS RISK CATEGORY No Risk

## 2023-05-24 NOTE — Progress Notes (Signed)
   05/24/23 1320  [facility] Triage Screening (Walk-ins at [facility] only)  How Did You Hear About [place]? Self  What Is the Reason for Your Visit/Call Today? Pt is a 63 yo female who presented voluntarily and unaccompanied due to increasing substance use and worsening isolating behavior due to paranoia. Pt reported a hx of schizophrenia with no prescribed medications. Pt stated she does not see an OP therapist. Pt stated that she has begun to use crack cocaine daily and has been using crack cocaine since her mid 20's. Las use was earlier today. Pt denied any other substance use. Pt denied SI, HI, self-hram, AVH but reported paranoia. Pt stated that she "feels like people are against me" and that she sees horoscopes and items on the internet and others postings that she believes are about her. Pt stated that when this happens she gets a new phone thinking that will stop it. Pt stated she has been through about 15 phones in the last year, Pt denies any past IP psychiatric admissions.  How Long Has This Been Causing You Problems? > than 6 months  Have You Recently Had Any Thoughts About Hurting Yourself? No  Are You Planning to Commit Suicide/Harm Yourself At This time? No  Have you Recently Had Thoughts About Hurting Someone Karolee Ohs? No  Are You Planning To Harm Someone At This Time? No  Physical Abuse Denies  Verbal Abuse Denies  Sexual Abuse Denies  Exploitation of patient/patient's resources Denies  Self-Neglect Denies (na)  Are you currently experiencing any auditory, visual or other hallucinations? No  Have You Used Any Alcohol or Drugs in the Past 24 Hours? Yes  What Did You Use and How Much? crack cocaine, about $20 worth  Do you have any current medical co-morbidities that require immediate attention? No (none reported)  Clinician description of patient physical appearance/behavior: anxious, cooperative, alert and seemed fully oriented  What Do You Feel Would Help You the Most Today? Treatment for  Depression or other mood problem;Alcohol or Drug Use Treatment (to get an OP therapist to talk to)  If access to [facility] was not available, would you have sought care in the Emergency Department? Yes  Determination of Need Urgent (48 hours)  Options For Referral Intensive Outpatient Therapy  Determination of Need filed? Yes

## 2023-05-24 NOTE — Discharge Instructions (Signed)
You are to report here at the Behavioral health urgent care on Friday  05-26-23 at 09:00 am.

## 2023-05-26 ENCOUNTER — Ambulatory Visit (INDEPENDENT_AMBULATORY_CARE_PROVIDER_SITE_OTHER): Payer: Medicaid Other

## 2023-05-26 ENCOUNTER — Encounter (HOSPITAL_COMMUNITY): Payer: Self-pay

## 2023-05-26 DIAGNOSIS — F1420 Cocaine dependence, uncomplicated: Secondary | ICD-10-CM | POA: Diagnosis not present

## 2023-05-26 DIAGNOSIS — F1994 Other psychoactive substance use, unspecified with psychoactive substance-induced mood disorder: Secondary | ICD-10-CM

## 2023-05-26 DIAGNOSIS — F19959 Other psychoactive substance use, unspecified with psychoactive substance-induced psychotic disorder, unspecified: Secondary | ICD-10-CM

## 2023-05-26 NOTE — Progress Notes (Addendum)
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 Other (Comment)  Referral name: [facility]  Referral phone number: No data recorded  Whom do you see for routine medical problems? No data recorded Practice/Facility Name: No data recorded Practice/Facility Phone Number: No data recorded Name of Contact: No data recorded Contact Number: No data recorded Contact Fax Number: No data recorded Prescriber Name: No data recorded Prescriber Address (if known): No data recorded  What Is the Reason for Your Visit/Call Today?How Long Has This Been Causing You Problems? > than 6 months  What Do You Feel Would Help You the Most Today? Alcohol or Drug Use Treatment   Have You Recently Been in Any Inpatient Treatment (Hospital/Detox/Crisis Center/28-Day Program)? Yes  Name/Location of Program/Hospital:[facility]  How Long Were You There? 1 day  When Were You Discharged? 05/24/23   Have You Ever Received Services From [employer] Before? No  Who Do You See at [facility]? No data recorded  Have You Recently Had Any Thoughts About Hurting Yourself? No  Are You Planning to Commit Suicide/Harm Yourself At This time? No   Have you Recently Had Thoughts About Hurting Someone Karolee Ohs? No  Explanation: No data recorded  Have You Used Any Alcohol or Drugs in the Past 24 Hours? Yes  How Long Ago Did You Use Drugs or Alcohol? No data recorded What Did You Use and How Much? 30$ of crack/cocaine   Do You Currently Have a Therapist/Psychiatrist? No  Name of Therapist/Psychiatrist: No data recorded  Have You Been Recently Discharged From Any  Office Practice or Programs? No  Explanation of Discharge From Practice/Program: No data recorded    CCA Screening Triage Referral Assessment Type of Contact: Face-to-Face  Is this Initial or Reassessment? No data recorded Date Telepsych consult ordered in [facility]:  No data recorded Time Telepsych consult ordered in [facility]:  No data recorded  Patient Reported Information Reviewed? No data recorded Patient Left Without Being Seen? No data recorded Reason for Not Completing Assessment: No data recorded  Collateral Involvement: none   Does Patient Have a Court Appointed Legal Guardian? No data recorded Name and Contact of Legal Guardian: No data recorded If Minor and Not Living with Parent(s), Who has Custody? No data recorded Is CPS involved or ever been involved? Never  Is APS involved or ever been involved? Never   Patient Determined To Be At Risk for Harm To Self or Others Based on Review of Patient Reported Information or Presenting Complaint? No  Method: No Plan  Availability of Means: No access or NA  Intent: Vague intent or NA  Notification Required: No need or identified person  Additional Information for Danger to Others Potential: No data recorded Additional Comments for Danger to Others Potential: No data recorded Are There Guns or Other Weapons in Your Home? No  Types of Guns/Weapons: No data recorded Are These Weapons Safely Secured?                            No data recorded Who Could Verify You Are Able To Have These Secured: No data recorded Do You Have any Outstanding Charges, Pending Court  Dates, Parole/Probation? none  Contacted To Inform of Risk of Harm To Self or Others: No data recorded  Location of Assessment: [facility]   Does Patient Present under Involuntary Commitment? No  IVC Papers Initial File Date: No data recorded  [place] of Residence: [place]   Patient Currently Receiving the Following Services: Not Receiving  Services   Determination of Need: Routine (7 days)   Options For Referral: Outpatient Therapy     CCA Biopsychosocial Intake/Chief Complaint:  Lam presents today for a CCA reporting she went [facility] on 05-24-23 and spoke to staff about her crack/cocaine use. She says that she has smoked crack for 63 years and started when she was about 63 years old.  She reported 2.5 years of sobriety, but did not want to elaborate on how she was able to stay abstient for that period of time. Later in the assessment she reported she stopped because she had a heart attack and "it scared me to death". 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   Lakenzie says that she was diagnosed as having Schizophrenia a year ago by a psychiatrist on [place].  She does not remember the name of the agency or the psychiatrist.  Therapist asks Bradley if she was smoking crack when she received the diagnosis and she said she was.  Lenice describes her use as binge using.  She says she will use for a few days, go a few days without using and resume using.  She says when she uses she uses about $30.00 dollars worth. Kashayla is guarded and refuses to answer many questions on the CCA reporting they are too personal and have nothing to do with her treatment. She puts on her coat at one point saying she is deciding whether to stay or not, if this agency wants to know her personal business.  Ragina endorses depressive and anxiety symptoms during her duration of use.Violetta denies any history of trauma.  She reports that she does not trust people and documented on her paperwork that people are out to get her. When Itzell says she lives alone and does not have any local support as her two daughters live in [place], this therapist discusses NA meetings and asks if she has any interest.  She smiles but then suddenly becomes tearful.  Therapist asks her if she is upset and she responds saying she is not going to tell this therapist because she does not want to upset  this therapist.  Therapist assures Georgena she (therapist) will not become upset by what she says.  She says she does have an older brother who is diagnosed with schizophrenia.  She comes from a family of 12 siblings, all of which are currently living.  Magenta says she has sciatica pain in her rt leg which causes it to drop.  Current Symptoms/Problems: depressive and anxiety symptoms   Patient Reported Schizophrenia/Schizoaffective Diagnosis in Past: Yes (reports she was dx with schizophrenia a year ago but she was using crack/cocaine)   Strengths: "I am a kind person"  Preferences: Individual Theapy  Abilities: "interested in interior decorating. Cooking"   Type of Services Patient Feels are Needed: Individual Therapy   Initial Clinical Notes/Concerns: Edona has used crack cocaine for 38 years without interruption.   Mental Health Symptoms Depression:  Change in energy/activity; Difficulty Concentrating; Hopelessness; Irritability (quit to anger)   Duration of Depressive symptoms: Greater than two weeks   Mania:  No data recorded  Anxiety:  Difficulty concentrating; Irritability; Worrying   Psychosis:  None   Duration of Psychotic symptoms: No data recorded  Trauma:  None   Obsessions:  None   Compulsions:  None   Inattention:  No data recorded  Hyperactivity/Impulsivity:  None   Oppositional/Defiant Behaviors:  No data recorded  Emotional Irregularity:  Transient, stress-related paranoia/disassociation   Other Mood/Personality Symptoms:  No data recorded   Mental Status Exam Appearance and self-care  Stature:  Average   Weight:  Average weight   Clothing:  Casual   Grooming:  Normal   Cosmetic use:  Age appropriate   Posture/gait:  Normal   Motor activity:  Not Remarkable   Sensorium  Attention:  Normal   Concentration:  Normal   Orientation:  X5   Recall/memory:  Normal   Affect and Mood  Affect:  Full Range   Mood:  Euthymic   Relating  Eye  contact:  Normal   Facial expression:  Responsive   Attitude toward examiner:  Cooperative   Thought and Language  Speech flow: Clear and Coherent   Thought content:  Appropriate to Mood and Circumstances   Preoccupation:  None   Hallucinations:  None   Organization:  No data recorded  [employer] of Knowledge:  Good   Intelligence:  Average   Abstraction:  Normal   Judgement:  Fair   Reality Testing:  Adequate   Insight:  Fair   Decision Making:  No data recorded  Social Functioning  Social Maturity:  Isolates   Social Judgement:  Normal   Stress  Stressors:  Other (Comment) (isolates)   Coping Ability:  Deficient supports   Skill Deficits:  Self-care (started going to medical appts one year ago, says she did not have Medicaid)   Supports:  Support needed     Religion: Religion/Spirituality Are You A Religious Person?: Yes What is Your Religious Affiliation?: Christian How Might This Affect Treatment?: "it will benefit my treatment"  Leisure/Recreation: Leisure / Recreation Do You Have Hobbies?: No  Exercise/Diet: Exercise/Diet Do You Exercise?: No Have You Gained or Lost A Significant Amount of Weight in the Past Six Months?: No Do You Follow a Special Diet?: No Do You Have Any Trouble Sleeping?: No   CCA Employment/Education Employment/Work Situation: Employment / Work Situation Employment Situation: Unemployed Patient's Job has Been Impacted by Current Illness:  (no jobs) What is the Longest Time Patient has Held a Job?: n/a Where was the Patient Employed at that Time?: n/a Has Patient ever Been in the [employer]?:  (refuses to answer)  Education: Education Is Patient Currently Attending School?: No Last Grade Completed:  ("This is too personal") Did Garment/textile technologist From [employer]?:  ("This is too personal") Did You Attend College?:  ("This is too personal") Did You Have Any Special Interests In School?: "This is too  personal"   CCA Family/Childhood History Family and Relationship History: Family history Marital status: Single Are you sexually active?:  (refuses to answer) What is your sexual orientation?: "This is too personal" Has your sexual activity been affected by drugs, alcohol, medication, or emotional stress?: "This is too personal" Does patient have children?: Yes How many children?: 2 How is patient's relationship with their children?: good. They live in [place].  Childhood History:  Childhood History By whom was/is the patient raised?: Both parents Description of patient's relationship with caregiver when they were a child: good Patient's description of current relationship with people who raised him/her: "deceased" How were you disciplined  when you got in trouble as a child/adolescent?: "This too personal" Does patient have siblings?: Yes Number of Siblings: 12 Description of patient's current relationship with siblings: good Did patient suffer any verbal/emotional/physical/sexual abuse as a child?: No Did patient suffer from severe childhood neglect?: No Has patient ever been sexually abused/assaulted/raped as an adolescent or adult?: No Was the patient ever a victim of a crime or a disaster?: No Witnessed domestic violence?: No Has patient been affected by domestic violence as an adult?: No  Child/Adolescent Assessment:     CCA Substance Use Alcohol/Drug Use: Alcohol / Drug Use Pain Medications: no Over the Counter: Tylenol History of alcohol / drug use?: Yes Longest period of sobriety (when/how long): 2.5 years "When I had my first heart attack and it scared the hell out of me" Negative Consequences of Use:  (refuses to answer) Substance #1 Name of Substance 1: Cocaine 1 - Age of First Use: 25 1 - Amount (size/oz): 30$ 1 - Frequency: binges 1 - Last Use / Amount: 05/25/23 1 - Method of Aquiring: illicit 1- Route of Use: smoking                    ASAM  Multidimensional Assessment Summary    Dimension 1: Description of individual's past and current experiences of substance use and withdrawal -- none=0 none=0  DImension 1: Acute Intoxication and/or Withdrawal Potential Severity Rating -- None None  Dimension 2: Description of patient's biomedical conditions and complications -- She reports she has had 4 heart attacks. She has sciatica pain in her rt leg. She does rate her pain as a "7" with 10 being the worst rataing =2 She reports she has had 4 heart attacks. She has sciatica pain in her rt leg. She does rate her pain as a "7" with 10 being the worst rataing =2  Dimension 2: Biomedical Conditions and Complications Severity Rating -- Severe Moderate  Dimension 3: Description of emotional, behavioral, or cognitive conditions and complications -- some depressive and anxiety symptoms some depressive and anxiety symptoms  Dimension 3: Emotional, behavioral or cognitive (EBC) conditions and complications severity rating -- Moderate Moderate  Dimension 4: Description of Readiness to Change criteria -- willing to engage in individual therapy .rating 1 willing to engage in individual therapy .rating 1  Dimension 4: Readiness to Change Severity Rating -- Mild Mild  Dimension 5: Relapse, continued use, or continued problem potential critiera description -- rating =4 No skills to cope with addition problems or prevent relapse. rating =4 No skills to cope with addition problems or prevent relapse.  Dimension 5: Relapse, continued use, or continued problem potential severity rating -- Very Severe Very Severe  Dimension 6: Recovery/Iiving environment criteria description -- lives alone. rating=4 lives alone. rating=4  Dimension 6: Recovery/living environment severity rating -- Very Severe Very Severe  ASAM's Severity Rating Score -- 13 13  ASAM Recommended Level of Treatment -- Level II Intensive Outpatient Treatment      Substance use Disorder (SUD) Substance  Use Disorder (SUD)  Checklist Symptoms of Substance Use: Continued use despite having a persistent/recurrent physical/psychological problem caused/exacerbated by use, Evidence of tolerance, Presence of craving or strong urge to use, Substance(s) often taken in larger amounts or over longer times than was intended, Large amounts of time spent to obtain, use or recover from the substance(s), Social, occupational, recreational activities given up or reduced due to use Corrie Dandy would not answer these questions directly, however therapist gained this information through earlier conversation)  Recommendations for Services/Supports/Treatments: Recommendations for Services/Supports/Treatments Recommendations For Services/Supports/Treatments: Individual Therapy  Raymonda meets criteria for Residential treatment, however she does not want that.  She does not want to participate in CD-IOP as she says she cannot sit for that period of time. She opts for individual therapy. Next appt will be Jun 01, 2023 at 2pm  DSM5 Diagnoses: Patient Active Problem List   Diagnosis Date Noted   Acute ST elevation myocardial infarction (STEMI) involving left anterior descending (LAD) coronary artery (HCC) 10/27/2017   Prolonged QT interval 04/23/2016   CAD (coronary artery disease) 04/23/2016   H/O noncompliance with medical treatment, presenting hazards to health 04/23/2016   Hyperlipidemia LDL goal <70    Ischemic cardiomyopathy    Acute ST elevation myocardial infarction (STEMI) of anterior wall (HCC) 04/21/2016   HTN (hypertension) 04/21/2016   Diabetes type 2, controlled (HCC) 04/21/2016   Tobacco abuse 04/21/2016   Ventricular tachycardia, sustained (HCC) 04/21/2016   Morbid obesity (HCC) 01/01/2016   Acute coronary syndrome (HCC) 12/31/2015   Cocaine abuse (HCC) 12/31/2015   Leukocytosis 12/31/2015    Patient Centered Plan: Patient is on the following Treatment Plan(s):  Substance Use   Referrals to Alternative  Service(s): Referred to Alternative Service(s):   Place:   Date:   Time:    Referred to Alternative Service(s):   Place:   Date:   Time:    Referred to Alternative Service(s):   Place:   Date:   Time:    Referred to Alternative Service(s):   Place:   Date:   Time:      Collaboration of Care: N/A  Patient/Guardian was advised Release of Information must be obtained prior to any record release in order to collaborate their care with an outside provider. Patient/Guardian was advised if they have not already done so to contact the registration department to sign all necessary forms in order for [place] to release information regarding their care.   Consent: Patient/Guardian gives verbal consent for treatment and assignment of benefits for services provided during this visit. Patient/Guardian expressed understanding and agreed to proceed.   Remigio Eisenmenger, LCAS

## 2023-06-01 ENCOUNTER — Encounter (HOSPITAL_COMMUNITY): Payer: Self-pay

## 2023-06-01 ENCOUNTER — Ambulatory Visit (HOSPITAL_COMMUNITY): Payer: Medicaid Other

## 2023-06-01 NOTE — Progress Notes (Deleted)
 Pt was a No Show. Therapist sends her a NS letter.  Remigio Eisenmenger, MS, LMFT, LCAS

## 2023-06-19 DIAGNOSIS — E782 Mixed hyperlipidemia: Secondary | ICD-10-CM | POA: Diagnosis not present

## 2023-06-19 DIAGNOSIS — D751 Secondary polycythemia: Secondary | ICD-10-CM | POA: Diagnosis not present

## 2023-06-19 DIAGNOSIS — J4489 Other specified chronic obstructive pulmonary disease: Secondary | ICD-10-CM | POA: Diagnosis not present

## 2023-06-19 DIAGNOSIS — I10 Essential (primary) hypertension: Secondary | ICD-10-CM | POA: Diagnosis not present

## 2023-06-19 DIAGNOSIS — E119 Type 2 diabetes mellitus without complications: Secondary | ICD-10-CM | POA: Diagnosis not present

## 2023-06-19 DIAGNOSIS — Z720 Tobacco use: Secondary | ICD-10-CM | POA: Diagnosis not present

## 2023-06-19 DIAGNOSIS — I2510 Atherosclerotic heart disease of native coronary artery without angina pectoris: Secondary | ICD-10-CM | POA: Diagnosis not present

## 2023-06-19 DIAGNOSIS — M1711 Unilateral primary osteoarthritis, right knee: Secondary | ICD-10-CM | POA: Diagnosis not present

## 2023-06-19 DIAGNOSIS — N611 Abscess of the breast and nipple: Secondary | ICD-10-CM | POA: Diagnosis not present

## 2023-06-22 ENCOUNTER — Other Ambulatory Visit: Payer: Self-pay | Admitting: Physician Assistant

## 2023-06-22 DIAGNOSIS — N6320 Unspecified lump in the left breast, unspecified quadrant: Secondary | ICD-10-CM

## 2023-07-04 DIAGNOSIS — E782 Mixed hyperlipidemia: Secondary | ICD-10-CM | POA: Diagnosis not present

## 2023-07-04 DIAGNOSIS — E119 Type 2 diabetes mellitus without complications: Secondary | ICD-10-CM | POA: Diagnosis not present

## 2023-07-04 DIAGNOSIS — I10 Essential (primary) hypertension: Secondary | ICD-10-CM | POA: Diagnosis not present

## 2023-07-04 DIAGNOSIS — I2510 Atherosclerotic heart disease of native coronary artery without angina pectoris: Secondary | ICD-10-CM | POA: Diagnosis not present

## 2023-07-04 DIAGNOSIS — D751 Secondary polycythemia: Secondary | ICD-10-CM | POA: Diagnosis not present

## 2023-07-04 DIAGNOSIS — M1711 Unilateral primary osteoarthritis, right knee: Secondary | ICD-10-CM | POA: Diagnosis not present

## 2023-07-04 DIAGNOSIS — Z720 Tobacco use: Secondary | ICD-10-CM | POA: Diagnosis not present

## 2023-07-04 DIAGNOSIS — J4489 Other specified chronic obstructive pulmonary disease: Secondary | ICD-10-CM | POA: Diagnosis not present

## 2023-07-05 DIAGNOSIS — E119 Type 2 diabetes mellitus without complications: Secondary | ICD-10-CM | POA: Diagnosis not present

## 2023-07-05 DIAGNOSIS — N611 Abscess of the breast and nipple: Secondary | ICD-10-CM | POA: Diagnosis not present

## 2023-07-07 ENCOUNTER — Encounter

## 2023-07-07 ENCOUNTER — Other Ambulatory Visit

## 2023-07-24 ENCOUNTER — Other Ambulatory Visit: Payer: Medicaid Other

## 2023-07-26 ENCOUNTER — Ambulatory Visit
Admission: RE | Admit: 2023-07-26 | Discharge: 2023-07-26 | Disposition: A | Discharge status: Home / Self Care | Source: Ambulatory Visit | Attending: Physician Assistant | Admitting: Physician Assistant

## 2023-07-26 DIAGNOSIS — N644 Mastodynia: Secondary | ICD-10-CM | POA: Diagnosis not present

## 2023-07-26 DIAGNOSIS — N6320 Unspecified lump in the left breast, unspecified quadrant: Secondary | ICD-10-CM

## 2023-08-17 ENCOUNTER — Encounter: Payer: Self-pay | Admitting: Cardiovascular Disease

## 2023-08-17 ENCOUNTER — Ambulatory Visit: Payer: Medicaid Other | Attending: Cardiology | Admitting: Cardiovascular Disease

## 2023-08-17 VITALS — BP 116/76 | HR 69 | Ht 67.0 in | Wt 172.0 lb

## 2023-08-17 DIAGNOSIS — I249 Acute ischemic heart disease, unspecified: Secondary | ICD-10-CM | POA: Diagnosis not present

## 2023-08-17 DIAGNOSIS — I2510 Atherosclerotic heart disease of native coronary artery without angina pectoris: Secondary | ICD-10-CM

## 2023-08-17 DIAGNOSIS — Z720 Tobacco use: Secondary | ICD-10-CM

## 2023-08-17 DIAGNOSIS — Z87898 Personal history of other specified conditions: Secondary | ICD-10-CM | POA: Diagnosis not present

## 2023-08-17 DIAGNOSIS — I10 Essential (primary) hypertension: Secondary | ICD-10-CM | POA: Diagnosis not present

## 2023-08-17 DIAGNOSIS — E785 Hyperlipidemia, unspecified: Secondary | ICD-10-CM | POA: Diagnosis not present

## 2023-08-17 MED ORDER — NITROGLYCERIN 0.4 MG SL SUBL: 0.4000 mg | SUBLINGUAL_TABLET | SUBLINGUAL | 2 refills | Status: AC | PRN

## 2023-08-17 MED ORDER — CLOPIDOGREL BISULFATE 75 MG PO TABS: 75.0000 mg | ORAL_TABLET | Freq: Every day | ORAL | 0 refills | Status: AC

## 2023-08-17 MED ORDER — ASPIRIN 81 MG PO TBEC: 81.0000 mg | DELAYED_RELEASE_TABLET | Freq: Every day | ORAL | 3 refills | Status: AC

## 2023-08-17 MED ORDER — ATORVASTATIN CALCIUM 80 MG PO TABS: 80.0000 mg | ORAL_TABLET | Freq: Every evening | ORAL | 2 refills | Status: AC

## 2023-08-17 NOTE — Patient Instructions (Signed)
 Medication Instructions:  Your physician recommends that you continue on your current medications as directed. Please refer to the Current Medication list given to you today.    *If you need a refill on your cardiac medications before your next appointment, please call your pharmacy*   Lab Work: None    If you have labs (blood work) drawn today and your tests are completely normal, you will receive your results only by: MyChart Message (if you have MyChart) OR A paper copy in the mail If you have any lab test that is abnormal or we need to change your treatment, we will call you to review the results.   Testing/Procedures: None    Follow-Up: At [facility], you and your health needs are our priority.  As part of our continuing mission to provide you with exceptional heart care, we have created designated Provider Care Teams.  These Care Teams include your primary Cardiologist (physician) and Advanced Practice Providers (APPs -  Physician Assistants and Nurse Practitioners) who all work together to provide you with the care you need, when you need it.  We recommend signing up for the patient portal called "MyChart".  Sign up information is provided on this After Visit Summary.  MyChart is used to connect with patients for Virtual Visits (Telemedicine).  Patients are able to view lab/test results, encounter notes, upcoming appointments, etc.  Non-urgent messages can be sent to your provider as well.   To learn more about what you can do with MyChart, go to [email or web address].    Your next appointment:   1 year(s)  The format for your next appointment:   In Person  Provider:   Marlana Silvan, NP       Other Instructions

## 2023-08-17 NOTE — Progress Notes (Unsigned)
 Cardiology Office Note    Date:  08/18/2023   ID:  Natasha Roach, DOB 10/15/1960, MRN [id number]  PCP:  Darol Elizabeth, NP  Cardiologist:  Magnus Schuller, MD   13 month F/U office visit with me  History of Present Illness:  Natasha Roach is a 63 y.o. female who presents for a 13 month follow-up office evaluation.    Natasha Roach has a history of tobacco use, hypertension, diabetes mellitus and cocaine  use.  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  I saw her on April 21, 2016 when she presented with 10 out of 10 chest pain shortly after using crack cocaine .  She was found to have acute ST segment elevation MI secondary to total occlusion of the proximal LAD with initial TIMI 0 flow and no evidence for collateralization.  She had normal circumflex and small right coronary artery.  She developed reperfusion induced ventricular tachycardia requiring cardioversion/defibrillation and amiodarone  bolus.  Ultimately her LAD was successfully stented with a 3.5 x 20 mm DES stent postdilated 3.78 mm with resumption of TIMI-3 flow in a very large LAD vessel.  I had not seen the patient since that presentation.  Apparently, over the past several years she had recurrent unfortunate issues with repeat chest pain.  She represented in July 2019 again presenting with severe chest pain following cocaine  use.  She was found to have a thrombotic lesion in the LAD just proximal to her prior stent which was successfully intervened upon with insertion of a DES stent.  She ultimately signed out AGAINST MEDICAL ADVICE.  She represented again in September and underwent repeat catheterization on January 16, 2018 by Dr. Swaziland.  Her LAD stent was patent but she had a 90% stenosis in the diagonal branch which was treated with PTCA and not stented.  An echo Doppler study from January 16, 2018 showed an EF of 50 to 55% without regional wall motion abnormalities.  The  aorta was mildly calcified with a questionable density in the aortic arch since her last event, she is now been on Plavix  regularly previously had run out of her medication leading to recurrent events.  Since her most recent intervention, she has not experienced any recurrent chest pain.  Unfortunately she still smokes cigarettes.  She is on atorvastatin  80 mg for hyperlipidemia, aspirin  and Plavix .    When I saw her on January 27 2018 I had a very lengthy discussion with her and stressed the importance of significant lifestyle adjustment, smoking cessation, discontinuance of drugs, during that discussion she finally seem to understand the importance and made a major effort in improving her lifestyle.   When I saw her in February 2020, she was extremely gratified for the time that I had spent with her and became very tearful and that my discussion was of major impact in her significant lifestyle adjustment.  She had dramatically reduced drug use but unfortunately still continues to smoke.  She was 100% percent compliant with taking her medications.  Her echo Doppler study in September 2019 showed an EF of 50 to 55% with normal wall motion and was without any regional wall motion abnormalities suggesting complete salvage of myocardium despite her prior event.  She continued to have some issues with her spinal stenosis which limited her exercise.  I saw her in October 2020 at which time she continued to be very grateful and remained stable from a cardiovascular standpoint.  [facility]  The patient denies any recent drug use.  She continues to be on aspirin  and Plavix  as well as atorvastatin  80 mg daily.  She was not having any anginal symptoms or exertional dyspnea.    I saw her on March 11, 2020 the prior year she had continued to do well.  At that time, she was still walking 1 to 1-1/2 packs of cigarettes per day.  She continued to try to  avoid cocaine  but on several instances did use cocaine  intermittently.  She had not been taking her atorvastatin .  During that evaluation her blood pressure was mildly elevated and I discussed most recent hypertensive guidelines with target blood pressure less than 130/80.  I last saw her on April 07, 2021 at which time she remained stable and denied any recurrent chest pain or shortness of breath.  She continues to smoke 1 pack of cigarettes per day.  There has been rare use of rare to occasional rate.  Over the past year.  Her daughter has end-stage renal disease on dialysis and will need heart surgery.  She has not had recent laboratory.  I discussed the absolute importance of complete cessation.  She was evaluated by Marlana Silvan, NP on July 05, 2022.  Her blood pressure was well-controlled.  She was not taking her atorvastatin  and repeat laboratory was recommended.  Presently, Natasha Roach feels well.  She has continued to smoke cigarettes, and does use cocaine  rare to occasionally.  She denies any chest pain or shortness of breath.  She has been evaluated in behavioral health.  She continues to be very grateful for my discussion that I had with her several years ago and believes that this had changed her life and direction.  She presents for follow-up evaluation  Past Medical History:  Diagnosis Date   CAD (coronary artery disease) 04/23/2016   a. 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 b. STEMI 03/2016 due to cocaine , occ mLAD s/p Synergy stent, EF 40% by cath, 55% by echo.   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 at  time of STEMI 03/2016.    Past Surgical History:  Procedure Laterality Date   ABDOMINAL HYSTERECTOMY     BREAST SURGERY     abcess   CARDIAC CATHETERIZATION  2004   CARDIAC CATHETERIZATION N/A 04/21/2016   Procedure: Left Heart Cath and Coronary Angiography;  Surgeon: Millicent Ally, MD;  Location: [facility] INVASIVE CV LAB;  Service: Cardiovascular;  Laterality: N/A;   CARDIAC CATHETERIZATION N/A 04/21/2016   Procedure: Coronary Stent Intervention;  Surgeon: Millicent Ally, MD;  Location: [facility] INVASIVE CV LAB;  Service: Cardiovascular;  Laterality: N/A;   CORONARY STENT INTERVENTION N/A 10/27/2017   Procedure: CORONARY STENT INTERVENTION;  Surgeon: Swaziland, Peter M, MD;  Location: [facility] INVASIVE CV LAB;  Service: Cardiovascular;  Laterality: N/A;   CORONARY/GRAFT ACUTE MI REVASCULARIZATION N/A 10/27/2017   Procedure: Coronary/Graft Acute MI Revascularization;  Surgeon: Swaziland, Peter M, MD;  Location: [facility] INVASIVE CV LAB;  Service:  Cardiovascular;  Laterality: N/A;   CORONARY/GRAFT ACUTE MI REVASCULARIZATION N/A 01/16/2018   Procedure: Coronary/Graft Acute MI Revascularization;  Surgeon: Swaziland, Peter M, MD;  Location: [facility] INVASIVE CV LAB;  Service: Cardiovascular;  Laterality: N/A;   ELECTROPHYSIOLOGIC STUDY N/A 04/21/2016   Procedure: Cardioversion;  Surgeon: Millicent Ally, MD;  Location: [facility] INVASIVE CV LAB;  Service: Cardiovascular;  Laterality: N/A;   LEFT HEART CATH AND CORONARY ANGIOGRAPHY N/A 10/27/2017   Procedure: LEFT HEART CATH AND CORONARY ANGIOGRAPHY;  Surgeon: Swaziland, Peter M, MD;  Location: [facility] INVASIVE CV LAB;  Service: Cardiovascular;  Laterality: N/A;   LEFT HEART CATH AND CORONARY ANGIOGRAPHY N/A 01/16/2018   Procedure: LEFT HEART CATH AND CORONARY ANGIOGRAPHY;  Surgeon: Swaziland, Peter M, MD;  Location: [facility] INVASIVE CV LAB;  Service: Cardiovascular;  Laterality: N/A;    Current Medications: Outpatient Medications Prior to Visit  Medication Sig Dispense Refill   aspirin  81 MG EC tablet Take 1  tablet (81 mg total) by mouth daily. 90 tablet 3   atorvastatin  (LIPITOR ) 80 MG tablet Take 1 tablet (80 mg total) by mouth every evening. 90 tablet 2   clopidogrel  (PLAVIX ) 75 MG tablet Take 1 tablet (75 mg total) by mouth daily. 90 tablet 0   nitroGLYCERIN  (NITROSTAT ) 0.4 MG SL tablet Place 1 tablet (0.4 mg total) under the tongue every 5 (five) minutes x 3 doses as needed for chest pain. 75 tablet 2   No facility-administered medications prior to visit.     Allergies:   Patient has no known allergies.   Social History   Socioeconomic History   Marital status: Significant Other    Spouse name: Not on file   Number of children: Not on file   Years of education: Not on file   Highest education level: Not on file  Occupational History   Not on file  Tobacco Use   Smoking status: Every Day    Current packs/day: 1.00    Average packs/day: 1 pack/day for 40.0 years (40.0 ttl pk-yrs)    Types: Cigarettes   Smokeless tobacco: Never   Tobacco comments:    1-1.5 PPD  Vaping Use   Vaping status: Never Used  Substance and Sexual Activity   Alcohol use: No   Drug use: Yes    Types: Cocaine     Comment: last use 01/16/18   Sexual activity: Yes  Other Topics Concern   Not on file  Social History Narrative   Not on file   Social Drivers of Health   Financial Resource Strain: Low Risk  (01/17/2018)   Overall Financial Resource Strain (CARDIA)    Difficulty of Paying Living Expenses: Not hard at all  Food Insecurity: No Food Insecurity (01/17/2018)   Hunger Vital Sign    Worried About Running Out of Food in the Last Year: Never true    Ran Out of Food in the Last Year: Never true  Transportation Needs: Unknown (01/17/2018)   PRAPARE - Transportation    Lack of Transportation (Medical): Patient declined    Lack of Transportation (Non-Medical): Patient declined  Physical Activity: Unknown (01/17/2018)   Exercise Vital Sign    Days of Exercise per Week: Patient declined    Minutes of  Exercise per Session: Patient declined  Stress: Stress Concern Present (01/17/2018)   [employer] of Occupational Health - Occupational Stress Questionnaire    Feeling of Stress : To some extent  Social Connections: Unknown (09/07/2021)   [facility] from [facility]  Social Network    Social Network: Not on file     Family History:  The patient's  family history includes CAD in her father.   ROS General: Negative; No fevers, chills, or night sweats;  HEENT: Negative; No changes in vision or hearing, sinus congestion, difficulty swallowing Pulmonary: Negative; No cough, wheezing, shortness of breath, hemoptysis Cardiovascular: Negative; No chest pain, presyncope, syncope, palpitations GI: Negative; No nausea, vomiting, diarrhea, or abdominal pain GU: Negative; No dysuria, hematuria, or difficulty voiding Musculoskeletal: Negative; no myalgias, joint pain, or weakness Hematologic/Oncology: Negative; no easy bruising, bleeding Endocrine: Negative; no heat/cold intolerance; no diabetes Neuro: Negative; no changes in balance, headaches Skin: Negative; No rashes or skin lesions Psychiatric: Negative; No behavioral problems, depression Sleep: Negative; No snoring, daytime sleepiness, hypersomnolence, bruxism, restless legs, hypnogognic hallucinations, no cataplexy Other comprehensive 14 point system review is negative.   PHYSICAL EXAM:   VS:  BP 116/76   Pulse 69   Ht 5\' 7"  (1.702 m)   Wt 172 lb (78 kg)   SpO2 97%   BMI 26.94 kg/m     Repeat blood pressure by me was 114/74  Wt Readings from Last 3 Encounters:  08/17/23 172 lb (78 kg)  07/05/22 175 lb (79.4 kg)  04/07/21 178 lb 6.4 oz (80.9 kg)    General: Alert, oriented, no distress.  Skin: normal turgor, no rashes, warm and dry HEENT: Normocephalic, atraumatic. Pupils equal round and reactive to light; sclera anicteric; extraocular muscles intact;  Nose without nasal septal hypertrophy Mouth/Parynx  benign; Mallinpatti scale 3 Neck: No JVD, no carotid bruits; normal carotid upstroke Lungs: clear to ausculatation and percussion; no wheezing or rales Chest wall: without tenderness to palpitation Heart: PMI not displaced, RRR, s1 s2 normal, 1/6 systolic murmur, no diastolic murmur, no rubs, gallops, thrills, or heaves Abdomen: soft, nontender; no hepatosplenomehaly, BS+; abdominal aorta nontender and not dilated by palpation. Back: no CVA tenderness Pulses 2+ Musculoskeletal: full range of motion, normal strength, no joint deformities Extremities: no clubbing cyanosis or edema, Homan's sign negative  Neurologic: grossly nonfocal; Cranial nerves grossly wnl Psychologic: Normal mood and affect    Studies/Labs Reviewed:   EKG Interpretation Date/Time:  Thursday August 17 2023 10:28:23 EDT Ventricular Rate:  69 PR Interval:  144 QRS Duration:  88 QT Interval:  398 QTC Calculation: 426 R Axis:   30  Text Interpretation: Normal sinus rhythm Nonspecific T wave abnormality When compared with ECG of 17-Jan-2018 04:24, Nonspecific T wave abnormality has replaced inverted T waves in Inferior leads Confirmed by Magnus Schuller ([id number]) on 08/18/2023 7:48:16 PM    April 07, 2021 ECG (independently read by me): NSR at 72, mild inferolateral T wave abnormality  March 11, 2020 EKG:  EKG is ordered today.  Normal sinus rhythm at 63 bpm, previously noted mild inferolateral T wave inversion.  No ectopy.  Normal intervals  October 2020 ECG (independently read by me): Sinus rhythm at 60 bpm.  No ectopy.  Normal intervals.  Minimal RV conduction delay  February 2020 ECG (independently read by me):Sinus Bradycardia 59; Mild T wave abnormality inferolaterally; Nl intervals  February 06, 2018 ECG (independently read by me): Normal sinus rhythm at 74 bpm.  Mild T wave abnormality inferolaterally  Recent Labs:    Latest Ref Rng & Units 06/18/2018   11:44 AM 01/17/2018    7:52 AM 01/16/2018   12:33 PM   BMP  Glucose 65 - 99 mg/dL 629  528  413   BUN 6 -  24 mg/dL 10  10  11    Creatinine 0.57 - 1.00 mg/dL 1.61  0.96  0.45   BUN/Creat Ratio 9 - 23 16     Sodium 134 - 144 mmol/L 144  139  139   Potassium 3.5 - 5.2 mmol/L 4.8  4.1  4.0   Chloride 96 - 106 mmol/L 108  109  107   CO2 20 - 29 mmol/L 22  19    Calcium  8.7 - 10.2 mg/dL 9.3  8.7          Latest Ref Rng & Units 06/18/2018   11:44 AM 01/16/2018   12:00 PM 10/27/2017    3:56 PM  Hepatic Function  Total Protein 6.0 - 8.5 g/dL 5.8  5.1  4.8   Albumin 3.8 - 4.9 g/dL 4.2  3.1  3.2   AST 0 - 40 IU/L 15  16  15    ALT 0 - 32 IU/L 20  18  15    Alk Phosphatase 39 - 117 IU/L 113  74  86   Total Bilirubin 0.0 - 1.2 mg/dL 0.4  0.7  0.6        Latest Ref Rng & Units 06/18/2018   11:44 AM 01/17/2018    7:52 AM 01/16/2018   12:33 PM  CBC  WBC 3.4 - 10.8 x10E3/uL 13.5  16.6    Hemoglobin 11.1 - 15.9 g/dL 40.9  81.1  91.4   Hematocrit 34.0 - 46.6 % 43.9  45.3  45.0   Platelets 150 - 450 x10E3/uL 266  229     Lab Results  Component Value Date   MCV 84 06/18/2018   MCV 85.2 01/17/2018   MCV 86.1 01/16/2018   Lab Results  Component Value Date   TSH 0.963 06/18/2018   Lab Results  Component Value Date   HGBA1C 6.9 (H) 01/16/2018     BNP No results found for: "BNP"  ProBNP No results found for: "PROBNP"   Lipid Panel     Component Value Date/Time   CHOL 135 06/18/2018 1144   TRIG 150 (H) 06/18/2018 1144   HDL 49 06/18/2018 1144   CHOLHDL 2.8 06/18/2018 1144   CHOLHDL 4.0 01/17/2018 0212   VLDL 44 (H) 01/17/2018 0212   LDLCALC 56 06/18/2018 1144     RADIOLOGY: MM 3D DIAGNOSTIC MAMMOGRAM BILATERAL BREAST Result Date: 07/26/2023 CLINICAL DATA:  Patient developed left breast pain, retroareolar swelling, an overlying redness with subsequent development spontaneous drainage, consistent with breast infection/abscess. She was given a course of antibiotics which she has near completion. Symptoms have significantly improved  with only mild tenderness remaining. She has a more remote history of bilateral breast infections and incision and drainages. EXAM: DIGITAL DIAGNOSTIC BILATERAL MAMMOGRAM WITH TOMOSYNTHESIS AND CAD; ULTRASOUND LEFT BREAST LIMITED TECHNIQUE: Bilateral digital diagnostic mammography and breast tomosynthesis was performed. The images were evaluated with computer-aided detection. ; Targeted ultrasound examination of the left breast was performed. COMPARISON:  None.  Baseline study. ACR Breast Density Category b: There are scattered areas of fibroglandular density. FINDINGS: There are no breast masses, areas of architectural distortion or suspicious calcifications. There is relative increased density in the retroareolar left breast when compared to the right. On physical exam, there is no periareolar drainage. The retroareolar tissue is soft with no defined mass. No skin erythema. Targeted ultrasound is performed, showing a few ectatic ducts in the retroareolar left breast, but no mass. No intraductal tissue/debris. No evidence of an abscess. IMPRESSION: 1. No evidence of breast  malignancy. 2. No abscess. Patient has left breast mastitis symptoms have mostly resolved. RECOMMENDATION: 1.  Screening mammogram in one year.(Code:SM-B-01Y) I have discussed the findings and recommendations with the patient. If applicable, a reminder letter will be sent to the patient regarding the next appointment. BI-RADS CATEGORY  1: Negative. Electronically Signed   By: Amanda Jungling M.D.   On: 07/26/2023 16:06   US  LIMITED ULTRASOUND INCLUDING AXILLA LEFT BREAST  Result Date: 07/26/2023 CLINICAL DATA:  Patient developed left breast pain, retroareolar swelling, an overlying redness with subsequent development spontaneous drainage, consistent with breast infection/abscess. She was given a course of antibiotics which she has near completion. Symptoms have significantly improved with only mild tenderness remaining. She has a more remote  history of bilateral breast infections and incision and drainages. EXAM: DIGITAL DIAGNOSTIC BILATERAL MAMMOGRAM WITH TOMOSYNTHESIS AND CAD; ULTRASOUND LEFT BREAST LIMITED TECHNIQUE: Bilateral digital diagnostic mammography and breast tomosynthesis was performed. The images were evaluated with computer-aided detection. ; Targeted ultrasound examination of the left breast was performed. COMPARISON:  None.  Baseline study. ACR Breast Density Category b: There are scattered areas of fibroglandular density. FINDINGS: There are no breast masses, areas of architectural distortion or suspicious calcifications. There is relative increased density in the retroareolar left breast when compared to the right. On physical exam, there is no periareolar drainage. The retroareolar tissue is soft with no defined mass. No skin erythema. Targeted ultrasound is performed, showing a few ectatic ducts in the retroareolar left breast, but no mass. No intraductal tissue/debris. No evidence of an abscess. IMPRESSION: 1. No evidence of breast malignancy. 2. No abscess. Patient has left breast mastitis symptoms have mostly resolved. RECOMMENDATION: 1.  Screening mammogram in one year.(Code:SM-B-01Y) I have discussed the findings and recommendations with the patient. If applicable, a reminder letter will be sent to the patient regarding the next appointment. BI-RADS CATEGORY  1: Negative. Electronically Signed   By: Amanda Jungling M.D.   On: 07/26/2023 16:06     Additional studies/ records that were reviewed today include:  Reviewed the patient's hospitalizations, cardiac catheterizations and interventions, laboratory, echo Doppler and CT assessments.  ASSESSMENT:    1. Coronary artery disease involving native coronary artery of native heart without angina pectoris   2. History of ACS (acute coronary syndrome) (HCC): 2004; 2017; 2019   3. Essential hypertension   4. History of cocaine  use   5. Tobacco abuse   6. 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  Remarkably, despite at least 4 acute catheterizations, her LV function has remained essentially normal without regional wall motion abnormalities.  When I saw her in October 2019 after not having seen her since 2017 I spent considerable time with her and discussed the absolute importance of major lifestyle change.  During that evaluation, she seemed to "connect " with me and became very tearful and states that that discussion completely changed her life.  Since that time she made major lifestyle change.  She [facility]a [facility]  She continues to be on DAPT long-term.  She has previously noted T wave abnormality on ECG.  Presently, her blood pressure is stable.  She is not on any antihypertensive medication.  Remotely she had been on atorvastatin  and had  been inconsistent with use.  In 2020, LDL cholesterol had decreased to 56.  Over the last several years, labs have been ordered but they do not appear to have been done.  She has been followed at behavioral health.  Hopefully, she will be able to completely discontinue use of cocaine  and tobacco.  I discussed with her my plans for retirement.  I have recommended that she see Marlana Silvan, NP for follow-up evaluation in 1 year or sooner if needed.  At that time she can also be directed to reestablish with a new cardiologist.    Medication Adjustments/Labs and Tests Ordered: Current medicines are reviewed at length with the patient today.  Concerns regarding medicines are outlined above.  Medication changes, Labs and Tests ordered today are listed in the Patient Instructions below. Patient Instructions  Medication Instructions:  Your physician recommends that  you continue on your current medications as directed. Please refer to the Current Medication list given to you today.    *If you need a refill on your cardiac medications before your next appointment, please call your pharmacy*   Lab Work: None    If you have labs (blood work) drawn today and your tests are completely normal, you will receive your results only by: MyChart Message (if you have MyChart) OR A paper copy in the mail If you have any lab test that is abnormal or we need to change your treatment, we will call you to review the results.   Testing/Procedures: None    Follow-Up: At [facility], you and your health needs are our priority.  As part of our continuing mission to provide you with exceptional heart care, we have created designated Provider Care Teams.  These Care Teams include your primary Cardiologist (physician) and Advanced Practice Providers (APPs -  Physician Assistants and Nurse Practitioners) who all work together to provide you with the care you need, when you need it.  We recommend signing up for the patient portal called "MyChart".  Sign up information is provided on this After Visit Summary.  MyChart is used to connect with patients for Virtual Visits (Telemedicine).  Patients are able to view lab/test results, encounter notes, upcoming appointments, etc.  Non-urgent messages can be sent to your provider as well.   To learn more about what you can do with MyChart, go to [email or web address].    Your next appointment:   1 year(s)  The format for your next appointment:   In Person  Provider:   Marlana Silvan, NP       Other Instructions      Signed, Magnus Schuller, MD  08/18/2023 7:56 PM    [facility] [address], [place], [place]  [address] Phone: [phone]

## 2023-08-18 ENCOUNTER — Encounter: Payer: Self-pay | Admitting: Cardiovascular Disease

## 2023-08-22 DIAGNOSIS — Z720 Tobacco use: Secondary | ICD-10-CM | POA: Diagnosis not present

## 2023-08-22 DIAGNOSIS — M1711 Unilateral primary osteoarthritis, right knee: Secondary | ICD-10-CM | POA: Diagnosis not present

## 2023-08-22 DIAGNOSIS — I2510 Atherosclerotic heart disease of native coronary artery without angina pectoris: Secondary | ICD-10-CM | POA: Diagnosis not present

## 2023-08-22 DIAGNOSIS — E782 Mixed hyperlipidemia: Secondary | ICD-10-CM | POA: Diagnosis not present

## 2023-08-22 DIAGNOSIS — J4489 Other specified chronic obstructive pulmonary disease: Secondary | ICD-10-CM | POA: Diagnosis not present

## 2023-08-22 DIAGNOSIS — I10 Essential (primary) hypertension: Secondary | ICD-10-CM | POA: Diagnosis not present

## 2023-08-22 DIAGNOSIS — E119 Type 2 diabetes mellitus without complications: Secondary | ICD-10-CM | POA: Diagnosis not present

## 2023-09-28 ENCOUNTER — Telehealth (HOSPITAL_COMMUNITY): Payer: Self-pay

## 2023-09-28 ENCOUNTER — Encounter (HOSPITAL_COMMUNITY): Payer: Self-pay

## 2023-09-28 ENCOUNTER — Ambulatory Visit (HOSPITAL_COMMUNITY)
Admission: EM | Admit: 2023-09-28 | Discharge: 2023-09-28 | Disposition: A | Discharge status: Home / Self Care | Attending: Nurse Practitioner | Admitting: Nurse Practitioner

## 2023-09-28 DIAGNOSIS — F1424 Cocaine dependence with cocaine-induced mood disorder: Secondary | ICD-10-CM | POA: Insufficient documentation

## 2023-09-28 NOTE — Discharge Instructions (Signed)
 I am having you referred to the intensive substance abuse treatment program. They will reach out to you once you regarding next steps.

## 2023-09-28 NOTE — Telephone Encounter (Signed)
 Received referral from [facility] on this pt.  Therapist calls to schedule and the message says the number cannot be completed at this time,try your call later.  Therapist will send a letter offering an appointment for an assessment.  Earnie Gola, LMFT, LCAS

## 2023-09-28 NOTE — ED Provider Notes (Signed)
 Behavioral Health Urgent Care Medical Screening Exam  Patient Name: Natasha Roach MRN: [id number] Date of Evaluation: 09/28/23 Chief Complaint:  Cocaine  addiction. 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 female who walked into the [facility] today seeking assistance with achieving sobriety from cocaine  addiction. Per triage: "Natasha Roach presents to [facility] voluntarily unaccompanied. Pt states that she is here because of drugs. Pt states that she had some bad thoughts 2 hours ago, to protect herself because there were numerous sheriff at her apratment complex. Pt shares that she went to here fiance's job asking him to come home because she was afraid. Pt continues to say "I was going to do something bad" but will not disclose what that something bad is. Pt currently denies SI, HI, AVH and alcohol use. Pt states that she used 20 dolars worth of crack-cocaine  this morning. Pt states that she feels we can best help her by getting her attached with some support programs. Pt shares that she needs to get away from the drugs but she is unable to do so because she is bored. Pt states that she needs something to do with her time."  Assessment: During encounter with patient, she shares that she is unable to stay sober, and talks extensively about her substance abuse, specifically cocaine , reports currently living with her fianc, talks about her current environment being peaceful and howl she is finding it so difficult to adjust to this new lifestyle as she is always so used to functioning in a chaotic environment. She talks about the habit being a 30 year+ habit at this point, talks about her desire to beat the habit, and how it is becoming increasingly to beat it.  Patient reports not being interested in inpatient rehab, reports that she would like an intensive outpatient program where  she can possibly go and return home to her family on same day.  She also states that she would like to go lto oan AA or NA after completing an intensive outpatient program. Pt denies any other substance use.   Pt presents with a euthymic mood, her attention to personal hygiene and grooming is fair, eye contact is good, speech is clear & coherent. Thought contents are organized and logical, and pt currently denies SI/HI/AVH or paranoia. There is no evidence of delusional thoughts. There are no overt signs of psychosis.  Patient denies depressive symptoms, denies anxiety symptoms, denies other symptoms significant for other mental health conditions, states that she started cocaine  use when someone "dared" her so many years ago, and it turned into a habit.    Flowsheet Row ED from 09/28/2023 in [facility]or from 05/26/2023 in [facility] ED from 05/24/2023 in [facility]  C-SSRS RISK CATEGORY No Risk No Risk No Risk       Psychiatric Specialty Exam  Presentation  General Appearance:Fairly Groomed  Eye Contact:Fair  Speech:Clear and Coherent  Speech Volume:Normal  Handedness:Right   Mood and Affect  Mood: Depressed; Anxious  Affect: Congruent   Thought Process  Thought Processes: Coherent  Descriptions of Associations:Intact  Orientation:Full (Time, Place and Person)  Thought Content:WDL  Diagnosis of Schizophrenia or Schizoaffective disorder in past: Yes (reports she was dx with schizophrenia a year ago but she was using crack/cocaine )   Hallucinations:None  Ideas of Reference:None  Suicidal Thoughts:No  Homicidal Thoughts:No   Sensorium  Memory: Immediate Fair  Judgment: Fair  Insight: Fair   Chartered certified accountant: Fair  Attention Span: Fair  Recall: Natasha Roach of Knowledge: Fair  Language: Fair   Psychomotor Activity  Psychomotor  Activity: Normal   Assets  Assets: Manufacturing systems engineer; Desire for Improvement; Housing; Resilience; Social Support   Sleep  Sleep: Fair  Number of hours:  8   Physical Exam: Physical Exam Vitals and nursing note reviewed.  Musculoskeletal:        General: Normal range of motion.     Cervical back: Normal range of motion.  Neurological:     General: No focal deficit present.     Mental Status: She is oriented to person, place, and time.  Psychiatric:        Mood and Affect: Mood normal.        Behavior: Behavior normal.        Thought Content: Thought content normal.        Judgment: Judgment normal.    Review of Systems  Psychiatric/Behavioral:  Positive for substance abuse. Negative for depression, hallucinations, memory loss and suicidal ideas. The patient is nervous/anxious and has insomnia.   All other systems reviewed and are negative.  Blood pressure (!) 143/88, pulse 64, temperature 97.8 F (36.6 C), temperature source Oral, resp. rate 18, SpO2 100%. There is no height or weight on file to calculate BMI.  Musculoskeletal: Strength & Muscle Tone: within normal limits Gait & Station: normal Patient leans: N/A   [facility] MSE Discharge Disposition for Follow up and Recommendations: Based on my evaluation the patient does not appear to have an emergency medical condition and can be discharged with resources and follow up care in outpatient services for Substance Abuse Intensive Outpatient Program  Referred into program, and program will be asked to reach out  to the patient, and she is not patient and does not want to wait for us  to hear back from program prior to leaving.  Robet Chiquito, NP 09/28/2023, 3:46 PM

## 2023-09-28 NOTE — Progress Notes (Signed)
   09/28/23 1359  [facility] Triage Screening (Walk-ins at [facility] only)  How Did You Hear About Us ? Self  What Is the Reason for Your Visit/Call Today? Natasha Roach presents to [facility] voluntarily unaccompanied. Pt states that she is here because of drugs. Pt states that she had some bad thoughts 2 hours ago, to protect herself because there were numerous sheriff at her apratment complex. Pt shares that she went to here fiance's job asking him to come home because she was afraid. Pt continues to say "I was going to do something bad" but will not disclose what that something bad is. Pt currently denies SI, HI, AVH and alcohol use. Pt states that she used 20 dolars worth of crack-cocaine  this morning. Pt states that she feels we can best help her by getting her attached with some support programs. Pt shares that she needs to get away from the drugs but she is unable to do so because she is bored. Pt states that she needs something to do with her time.  How Long Has This Been Causing You Problems? <Week  Have You Recently Had Any Thoughts About Hurting Yourself? No  Are You Planning to Commit Suicide/Harm Yourself At This time? No  Have you Recently Had Thoughts About Hurting Someone [name]? No  Are You Planning To Harm Someone At This Time? No  Physical Abuse Denies  Verbal Abuse Denies  Sexual Abuse Denies  Exploitation of patient/patient's resources Denies  Self-Neglect Denies  Are you currently experiencing any auditory, visual or other hallucinations? No  Have You Used Any Alcohol or Drugs in the Past 24 Hours? Yes  What Did You Use and How Much? this morning - crack-cocaine  $20  Do you have any current medical co-morbidities that require immediate attention? No  Clinician description of patient physical appearance/behavior: anxious, appears to be thought blocking, cooperative  What Do You Feel Would Help You the Most Today? Social Support  If access to [facility]gent [facility] was not available, would you  have sought care in the Emergency Department? No  Determination of Need Routine (7 days)  Options For Referral Outpatient Therapy;Other: Comment (NA meeting)

## 2023-09-28 NOTE — ED Notes (Signed)
 Patient discharged by provider. Refused AVS

## 2023-10-03 DIAGNOSIS — E782 Mixed hyperlipidemia: Secondary | ICD-10-CM | POA: Diagnosis not present

## 2023-10-03 DIAGNOSIS — I10 Essential (primary) hypertension: Secondary | ICD-10-CM | POA: Diagnosis not present

## 2023-10-03 DIAGNOSIS — M1711 Unilateral primary osteoarthritis, right knee: Secondary | ICD-10-CM | POA: Diagnosis not present

## 2023-10-03 DIAGNOSIS — Z720 Tobacco use: Secondary | ICD-10-CM | POA: Diagnosis not present

## 2023-10-03 DIAGNOSIS — E119 Type 2 diabetes mellitus without complications: Secondary | ICD-10-CM | POA: Diagnosis not present

## 2023-10-03 DIAGNOSIS — I2510 Atherosclerotic heart disease of native coronary artery without angina pectoris: Secondary | ICD-10-CM | POA: Diagnosis not present

## 2023-10-03 DIAGNOSIS — J4489 Other specified chronic obstructive pulmonary disease: Secondary | ICD-10-CM | POA: Diagnosis not present

## 2023-10-03 DIAGNOSIS — Z0000 Encounter for general adult medical examination without abnormal findings: Secondary | ICD-10-CM | POA: Diagnosis not present

## 2023-11-21 DIAGNOSIS — I2510 Atherosclerotic heart disease of native coronary artery without angina pectoris: Secondary | ICD-10-CM | POA: Diagnosis not present

## 2023-11-21 DIAGNOSIS — I10 Essential (primary) hypertension: Secondary | ICD-10-CM | POA: Diagnosis not present

## 2023-11-21 DIAGNOSIS — M1711 Unilateral primary osteoarthritis, right knee: Secondary | ICD-10-CM | POA: Diagnosis not present

## 2023-11-21 DIAGNOSIS — E119 Type 2 diabetes mellitus without complications: Secondary | ICD-10-CM | POA: Diagnosis not present

## 2023-11-21 DIAGNOSIS — Z720 Tobacco use: Secondary | ICD-10-CM | POA: Diagnosis not present

## 2023-11-21 DIAGNOSIS — E782 Mixed hyperlipidemia: Secondary | ICD-10-CM | POA: Diagnosis not present

## 2023-11-21 DIAGNOSIS — J4489 Other specified chronic obstructive pulmonary disease: Secondary | ICD-10-CM | POA: Diagnosis not present

## 2023-12-05 ENCOUNTER — Encounter: Payer: Self-pay | Admitting: Podiatry

## 2023-12-05 ENCOUNTER — Ambulatory Visit (INDEPENDENT_AMBULATORY_CARE_PROVIDER_SITE_OTHER): Admitting: Podiatry

## 2023-12-05 DIAGNOSIS — M79674 Pain in right toe(s): Secondary | ICD-10-CM

## 2023-12-05 DIAGNOSIS — M79675 Pain in left toe(s): Secondary | ICD-10-CM | POA: Diagnosis not present

## 2023-12-05 DIAGNOSIS — B351 Tinea unguium: Secondary | ICD-10-CM | POA: Diagnosis not present

## 2023-12-05 DIAGNOSIS — E1159 Type 2 diabetes mellitus with other circulatory complications: Secondary | ICD-10-CM | POA: Diagnosis not present

## 2023-12-05 NOTE — Progress Notes (Addendum)
 This patient presents to the office with chief complaint of long thick nails and diabetic feet.  This patient  says there  is  no pain and discomfort in their feet.  This patient says there are long thick painful nails.  These nails are painful walking and wearing shoes.  Patient has no history of infection or drainage from both feet.  Patient is unable to  self treat his own nails . This patient presents  to the office today for treatment of the  long nails and a foot evaluation due to history of  diabetes.  General Appearance  Alert, conversant and in no acute stress.  Vascular  Dorsalis pedis and posterior tibial  pulses are palpable  bilaterally.  Capillary return is within normal limits  bilaterally. Temperature is within normal limits  bilaterally.  Neurologic  Senn-Weinstein monofilament wire test within normal limits  bilaterally. Muscle power within normal limits bilaterally.  Nails Thick disfigured discolored nails with subungual debris  from hallux to fifth toes bilaterally. No evidence of bacterial infection or drainage bilaterally. Pincer nails.  Orthopedic  No limitations of motion of motion feet .  No crepitus or effusions noted.  No bony pathology or digital deformities noted. HAV  B/L.  Skin  normotropic skin with no porokeratosis noted bilaterally.  No signs of infections or ulcers noted.     Onychomycosis  Diabetes with no foot complications  IE  Debride nails x 10.  A diabetic foot exam was performed and there is no evidence of any vascular or neurologic pathology.   RTC 3 months.   Cordella Bold DPM

## 2023-12-06 ENCOUNTER — Ambulatory Visit: Admitting: Podiatry

## 2023-12-24 ENCOUNTER — Emergency Department (HOSPITAL_COMMUNITY)
Admission: EM | Admit: 2023-12-24 | Discharge: 2023-12-24 | Disposition: A | Discharge status: Home / Self Care | Attending: Emergency Medicine | Admitting: Emergency Medicine

## 2023-12-24 ENCOUNTER — Emergency Department (HOSPITAL_COMMUNITY)

## 2023-12-24 ENCOUNTER — Other Ambulatory Visit: Payer: Self-pay

## 2023-12-24 ENCOUNTER — Encounter (HOSPITAL_COMMUNITY): Payer: Self-pay

## 2023-12-24 DIAGNOSIS — Z7902 Long term (current) use of antithrombotics/antiplatelets: Secondary | ICD-10-CM | POA: Insufficient documentation

## 2023-12-24 DIAGNOSIS — Z7982 Long term (current) use of aspirin: Secondary | ICD-10-CM | POA: Diagnosis not present

## 2023-12-24 DIAGNOSIS — K449 Diaphragmatic hernia without obstruction or gangrene: Secondary | ICD-10-CM | POA: Diagnosis not present

## 2023-12-24 DIAGNOSIS — K828 Other specified diseases of gallbladder: Secondary | ICD-10-CM | POA: Diagnosis not present

## 2023-12-24 DIAGNOSIS — K5732 Diverticulitis of large intestine without perforation or abscess without bleeding: Secondary | ICD-10-CM | POA: Insufficient documentation

## 2023-12-24 DIAGNOSIS — K5792 Diverticulitis of intestine, part unspecified, without perforation or abscess without bleeding: Secondary | ICD-10-CM

## 2023-12-24 DIAGNOSIS — D72829 Elevated white blood cell count, unspecified: Secondary | ICD-10-CM | POA: Diagnosis not present

## 2023-12-24 DIAGNOSIS — R1032 Left lower quadrant pain: Secondary | ICD-10-CM | POA: Diagnosis present

## 2023-12-24 DIAGNOSIS — K5900 Constipation, unspecified: Secondary | ICD-10-CM | POA: Diagnosis not present

## 2023-12-24 DIAGNOSIS — K429 Umbilical hernia without obstruction or gangrene: Secondary | ICD-10-CM | POA: Diagnosis not present

## 2023-12-24 LAB — CBC
HCT: 42.5 % (ref 36.0–46.0)
Hemoglobin: 12.8 g/dL (ref 12.0–15.0)
MCH: 26 pg (ref 26.0–34.0)
MCHC: 30.1 g/dL (ref 30.0–36.0)
MCV: 86.4 fL (ref 80.0–100.0)
Platelets: 294 K/uL (ref 150–400)
RBC: 4.92 MIL/uL (ref 3.87–5.11)
RDW: 14.6 % (ref 11.5–15.5)
WBC: 17.2 K/uL — ABNORMAL HIGH (ref 4.0–10.5)
nRBC: 0 % (ref 0.0–0.2)

## 2023-12-24 LAB — URINALYSIS, ROUTINE W REFLEX MICROSCOPIC
Bilirubin Urine: NEGATIVE
Glucose, UA: NEGATIVE mg/dL
Ketones, ur: NEGATIVE mg/dL
Leukocytes,Ua: NEGATIVE
Nitrite: NEGATIVE
Protein, ur: NEGATIVE mg/dL
Specific Gravity, Urine: 1.019 (ref 1.005–1.030)
pH: 5 (ref 5.0–8.0)

## 2023-12-24 LAB — COMPREHENSIVE METABOLIC PANEL WITH GFR
ALT: 15 U/L (ref 0–44)
AST: 17 U/L (ref 15–41)
Albumin: 3.6 g/dL (ref 3.5–5.0)
Alkaline Phosphatase: 104 U/L (ref 38–126)
Anion gap: 9 (ref 5–15)
BUN: 10 mg/dL (ref 8–23)
CO2: 22 mmol/L (ref 22–32)
Calcium: 9.2 mg/dL (ref 8.9–10.3)
Chloride: 110 mmol/L (ref 98–111)
Creatinine, Ser: 0.77 mg/dL (ref 0.44–1.00)
GFR, Estimated: >60 mL/min (ref >60)
Glucose, Bld: 113 mg/dL — ABNORMAL HIGH (ref 70–99)
Potassium: 4.1 mmol/L (ref 3.5–5.1)
Sodium: 141 mmol/L (ref 135–145)
Total Bilirubin: 0.3 mg/dL (ref 0.0–1.2)
Total Protein: 6.1 g/dL — ABNORMAL LOW (ref 6.5–8.1)

## 2023-12-24 LAB — LIPASE, BLOOD: Lipase: 34 U/L (ref 11–51)

## 2023-12-24 MED ORDER — OXYCODONE-ACETAMINOPHEN 5-325 MG PO TABS: 1.0000 | ORAL_TABLET | Freq: Four times a day (QID) | ORAL | 0 refills | Status: DC | PRN

## 2023-12-24 MED ORDER — IOHEXOL 350 MG/ML SOLN
75.0000 mL | Freq: Once | INTRAVENOUS | Status: AC | PRN
  Administered 2023-12-24: 75 mL via INTRAVENOUS

## 2023-12-24 MED ORDER — AMOXICILLIN-POT CLAVULANATE 875-125 MG PO TABS: 1.0000 | ORAL_TABLET | Freq: Two times a day (BID) | ORAL | 0 refills | Status: DC

## 2023-12-24 NOTE — ED Provider Notes (Addendum)
 [place] EMERGENCY DEPARTMENT AT [facility] Provider Note   CSN: [id number] Arrival date & time: 12/24/23  1316     Patient presents with: No chief complaint on file.   Natasha Roach is a 63 y.o. female.   63 year old female who presents with abdominal pain times several days.  Pain has been in her left lower quadrant.  Has been associate with constipation.  States that she has not had any fever, vomiting, no blood in her stools.  Took a laxative today while she was in the ER and had a large bowel movement and feels much better.  Denies any prior history of same.  Did use Tylenol  occasionally for discomfort.  Denies any urinary symptoms.  No GYN complaints.       Prior to Admission medications   Medication Sig Start Date End Date Taking? Authorizing Provider  aspirin  EC 81 MG tablet Take 1 tablet (81 mg total) by mouth daily. 08/17/23   Burnard Debby DELENA, MD  atorvastatin  (LIPITOR ) 80 MG tablet Take 1 tablet (80 mg total) by mouth every evening. 08/17/23   Burnard Debby DELENA, MD  clopidogrel  (PLAVIX ) 75 MG tablet Take 1 tablet (75 mg total) by mouth daily. 08/17/23   Burnard Debby DELENA, MD  nitroGLYCERIN  (NITROSTAT ) 0.4 MG SL tablet Place 1 tablet (0.4 mg total) under the tongue every 5 (five) minutes x 3 doses as needed for chest pain. 08/17/23   Burnard Debby DELENA, MD    Allergies: Patient has no known allergies.    Review of Systems  All other systems reviewed and are negative.   Updated Vital Signs BP (!) 144/98   Pulse 72   Temp (!) 97.5 F (36.4 C)   Resp 18   Ht 1.702 m (5' 7)   Wt 77.1 kg   SpO2 100%   BMI 26.63 kg/m   Physical Exam Vitals and nursing note reviewed.  Constitutional:      General: She is not in acute distress.    Appearance: Normal appearance. She is well-developed. She is not toxic-appearing.  HENT:     Head: Normocephalic and atraumatic.  Eyes:     General: Lids are normal.     Conjunctiva/sclera: Conjunctivae normal.      Pupils: Pupils are equal, round, and reactive to light.  Neck:     Thyroid: No thyroid mass.     Trachea: No tracheal deviation.  Cardiovascular:     Rate and Rhythm: Normal rate and regular rhythm.     Heart sounds: Normal heart sounds. No murmur heard.    No gallop.  Pulmonary:     Effort: Pulmonary effort is normal. No respiratory distress.     Breath sounds: Normal breath sounds. No stridor. No decreased breath sounds, wheezing, rhonchi or rales.  Abdominal:     General: There is no distension.     Palpations: Abdomen is soft.     Tenderness: There is abdominal tenderness in the left lower quadrant. There is no rebound.   Musculoskeletal:        General: No tenderness. Normal range of motion.     Cervical back: Normal range of motion and neck supple.  Skin:    General: Skin is warm and dry.     Findings: No abrasion or rash.  Neurological:     Mental Status: She is alert and oriented to person, place, and time. Mental status is at baseline.     GCS: GCS eye subscore is 4. GCS  verbal subscore is 5. GCS motor subscore is 6.     Cranial Nerves: No cranial nerve deficit.     Sensory: No sensory deficit.     Motor: Motor function is intact.  Psychiatric:        Attention and Perception: Attention normal.        Speech: Speech normal.        Behavior: Behavior normal.     (all labs ordered are listed, but only abnormal results are displayed) Labs Reviewed  COMPREHENSIVE METABOLIC PANEL WITH GFR - Abnormal; Notable for the following components:      Result Value   Glucose, Bld 113 (*)    Total Protein 6.1 (*)    All other components within normal limits  CBC - Abnormal; Notable for the following components:   WBC 17.2 (*)    All other components within normal limits  URINALYSIS, ROUTINE W REFLEX MICROSCOPIC - Abnormal; Notable for the following components:   Hgb urine dipstick SMALL (*)    Bacteria, UA RARE (*)    All other components within normal limits  LIPASE, BLOOD     EKG: None  Radiology: No results found.   Procedures   Medications Ordered in the ED - No data to display                                  Medical Decision Making Amount and/or Complexity of Data Reviewed Labs: ordered. Radiology: ordered.  Risk Prescription drug management.   Patient's labs significant for elevated white count of 17,000.  She subsequently had abdominal CT which showed uncomplicated diverticulitis.  No mention of abscess.  Patient informed of her condition and will be placed on antibiotics and will follow-up with her doctor    Final diagnoses:  None    ED Discharge Orders     None          Dasie Faden, MD 12/24/23 1722    Dasie Faden, MD 12/24/23 1726

## 2023-12-24 NOTE — ED Triage Notes (Signed)
 Patient here with complaints of abdominal pain for the past 2-3 days. She states she has sharp pain when she breaths. Denies nasuea, vomiting, diarrhea. Is having trouble having bowel movements.

## 2023-12-24 NOTE — Discharge Instructions (Addendum)
 Call your doctor on Tuesday to schedule a follow-up visit.

## 2024-01-03 DIAGNOSIS — Z720 Tobacco use: Secondary | ICD-10-CM | POA: Diagnosis not present

## 2024-01-03 DIAGNOSIS — J4489 Other specified chronic obstructive pulmonary disease: Secondary | ICD-10-CM | POA: Diagnosis not present

## 2024-01-03 DIAGNOSIS — E782 Mixed hyperlipidemia: Secondary | ICD-10-CM | POA: Diagnosis not present

## 2024-01-03 DIAGNOSIS — E119 Type 2 diabetes mellitus without complications: Secondary | ICD-10-CM | POA: Diagnosis not present

## 2024-01-03 DIAGNOSIS — I10 Essential (primary) hypertension: Secondary | ICD-10-CM | POA: Diagnosis not present

## 2024-01-03 DIAGNOSIS — M1711 Unilateral primary osteoarthritis, right knee: Secondary | ICD-10-CM | POA: Diagnosis not present

## 2024-01-03 DIAGNOSIS — I2510 Atherosclerotic heart disease of native coronary artery without angina pectoris: Secondary | ICD-10-CM | POA: Diagnosis not present

## 2024-02-06 DIAGNOSIS — M1711 Unilateral primary osteoarthritis, right knee: Secondary | ICD-10-CM | POA: Diagnosis not present

## 2024-02-06 DIAGNOSIS — R269 Unspecified abnormalities of gait and mobility: Secondary | ICD-10-CM | POA: Diagnosis not present

## 2024-02-14 DIAGNOSIS — M5416 Radiculopathy, lumbar region: Secondary | ICD-10-CM | POA: Diagnosis not present

## 2024-02-14 DIAGNOSIS — M1711 Unilateral primary osteoarthritis, right knee: Secondary | ICD-10-CM | POA: Diagnosis not present

## 2024-03-12 ENCOUNTER — Ambulatory Visit: Admitting: Podiatry

## 2024-03-18 ENCOUNTER — Ambulatory Visit (HOSPITAL_COMMUNITY)
Admission: EM | Admit: 2024-03-18 | Discharge: 2024-03-18 | Disposition: A | Discharge status: Home / Self Care | Attending: Family | Admitting: Family

## 2024-03-18 DIAGNOSIS — F1994 Other psychoactive substance use, unspecified with psychoactive substance-induced mood disorder: Secondary | ICD-10-CM

## 2024-03-18 DIAGNOSIS — F32A Depression, unspecified: Secondary | ICD-10-CM | POA: Insufficient documentation

## 2024-03-18 DIAGNOSIS — F1410 Cocaine abuse, uncomplicated: Secondary | ICD-10-CM

## 2024-03-18 DIAGNOSIS — F1414 Cocaine abuse with cocaine-induced mood disorder: Secondary | ICD-10-CM | POA: Insufficient documentation

## 2024-03-18 NOTE — Discharge Instructions (Addendum)
 Patient is instructed prior to discharge to:  Take all medications as prescribed by his/her mental healthcare provider. Report any adverse effects and or reactions from the medicines to his/her outpatient provider promptly. Keep all scheduled appointments, to ensure that you are getting refills on time and to avoid any interruption in your medication.  If you are unable to keep an appointment call to reschedule.  Be sure to follow-up with resources and follow-up appointments provided.  Patient has been instructed & cautioned: To not engage in alcohol and or illegal drug use while on prescription medicines. In the event of worsening symptoms, patient is instructed to call the crisis hotline, 911 and or go to the nearest ED for appropriate evaluation and treatment of symptoms. To follow-up with his/her primary care provider for your other medical issues, concerns and or health care needs.  Information: -National Suicide Prevention Lifeline 1-800-SUICIDE or (904)872-4770.  -988 offers 24/7 access to trained crisis counselors who can help people experiencing mental health-related distress. People can call or text 988 or chat 988lifeline.org for themselves or if they are worried about a loved one who may need crisis support.      Substance Abuse Treatment Resources listed Below:  [facility] - Admissions are currently completed Monday through Friday at 8am; both appointments and walk-ins are accepted.  Any individual that is a [facility] resident may present for a substance abuse screening and assessment for admission.  A person may be referred by numerous sources or self-refer.   Potential clients will be screened for medical necessity and appropriateness for the program.  Clients must meet criteria for high-intensity residential treatment services.  If clinically appropriate, a client will continue with the comprehensive clinical assessment and intake process, as well  as enrollment in the [facility].  Address: [address] [place], [place] [address] Admin Hours: [facility] to [facility] Hours: 24/7 Phone: [phone] Fax: [phone]  [facility] Address: [address] [place], [place], [place] [address] Behavioral Health Urgent Care [facility]) Hours: 24/7 Phone: [phone] Fax: [phone]  [facility] ([facility]): (offers outpatient therapy and intensive outpatient substance abuse therapy).  [address] [place], [place] [address] Phone: [phone]  [facility]: Offers FREE recovery skills classes, support groups, 1:1 Peer Support, and Compeer Classes. [address] [place], [place] [address] Phone: [phone] (Call to complete intake).     [facility]-will provide timely access to mental health services for children and adolescents (4-17) and adults presenting in a mental health crisis. The program is designed for those who need urgent Behavioral Health or Substance Use treatment and are not experiencing a medical crisis that would typically require an emergency room visit.    [address] [place], [place] [address] Phone: [phone] [email or web address]  [facility]: Phone#: [phone]  The [facility] SA Intensive Outpatient Program (SAIOP) means structured individual and group addiction activities and services that are provided at an outpatient program designed to assist adult and adolescent consumers to begin recovery and learn skills for recovery maintenance. The [facility] SAIOP program is offered at least 3 hours a day, 3 days a week.SAIOP services shall include a structured program consisting of, but not limited to, the following services: Individual counseling and support; Group counseling and support; Family counseling, training or support; Biochemical assays to identify recent drug use  (e.g., urine drug screens); Strategies for relapse prevention to include community and  social support systems in treatment; Life skills; Crisis contingency planning; Disease Management; and Treatment support activities that have been adapted or specifically designed for persons with physical disabilities, or persons with co-occurring disorders of mental illness and substance abuse/dependence or mental retardation/developmental disability and substance abuse/dependence. Phone: [phone]  Address:   [facility] will also offer the following outpatient services: (Monday through Friday 8am-5pm)   Partial Hospitalization Program (PHP) Substance Abuse Intensive Outpatient Program (SA-IOP) Group Therapy Medication Management Peer Living Room We also provide (24/7):  Assessments: Our mental health clinician and providers will conduct a focused mental health evaluation, assessing for immediate safety concerns and further mental health needs. Referral: Our team will provide resources and help connect to community based mental health treatment, when indicated, including psychotherapy, psychiatry, and other specialized behavioral health or substance use disorder services (for those not already in treatment). Transitional Care: Our team providers in person bridging and/or telephonic follow-up during the patient's transition to outpatient services.  [facility] 24-Hour Call Center: [phone] Behavioral Health Crisis Line: [phone]

## 2024-03-18 NOTE — Progress Notes (Signed)
   03/18/24 1235  [facility] Triage Screening (Walk-ins at [facility] only)  How Did You Hear About Us ? Self  What Is the Reason for Your Visit/Call Today? Natasha Roach is a 63 year old female presenting to [facility] unaccompanied. Pt states she has been struggling with her Cocaine  addiction for years. Pt states that she is also feeling paranoid for years. Pt is not currently taking medication for anxiety and depression. Pt states that she is looking for a therapist at this time. Pt states, I feel like someone is always out to get me. Pt reports that she is looking for ways to recover from her ongoing addiction problem. Pt states she last used cocaine  today just a few hours ago. Pt denies alcohol use, Si, HI and AVH.  How Long Has This Been Causing You Problems? > than 6 months  Have You Recently Had Any Thoughts About Hurting Yourself? No  Are You Planning to Commit Suicide/Harm Yourself At This time? No  Have you Recently Had Thoughts About Hurting Someone Sherral? No  Are You Planning To Harm Someone At This Time? No  Physical Abuse Denies  Verbal Abuse Denies  Sexual Abuse Denies  Exploitation of patient/patient's resources Denies  Self-Neglect Denies  Possible abuse reported to: Other (Comment)  Are you currently experiencing any auditory, visual or other hallucinations? No  Have You Used Any Alcohol or Drugs in the Past 24 Hours? Yes  What Did You Use and How Much? an hour ago  Do you have any current medical co-morbidities that require immediate attention? No  What Do You Feel Would Help You the Most Today? Medication(s);Alcohol or Drug Use Treatment  If access to [facility] Urgent Care was not available, would you have sought care in the Emergency Department? No  Determination of Need Urgent (48 hours)  Options For Referral Intensive Outpatient Therapy;Facility-Based Crisis  Determination of Need filed? Yes

## 2024-03-18 NOTE — ED Provider Notes (Signed)
 Behavioral Health Urgent Care Medical Screening Exam  Patient Name: Natasha Roach MRN: [id number] Date of Evaluation: 03/18/24 Chief Complaint:   Diagnosis:  Final diagnoses:  Cocaine  use disorder (HCC)  Substance induced mood disorder (HCC)    History of Present illness: Natasha Roach is a 63 y.o. female.  Patient prefers to be called Natasha Roach.  Patient presents voluntarily to [facility] behavioral health for walk-in assessment.  Patient states I am tired, I am too old to keep doing this.  I do drugs to cover up because sometimes I feel like I am nobody.  Natasha Roach endorses cocaine  use that began approximately 40 years ago.  Using cocaine , varying amounts, via smoking 5 out of 7 days/week.  Most recent cocaine  use approximately 2 hours ago.  Patient is tearful, states cocaine  has caused me not to have a social life. She would like to be more engaged with her two adult daughters and grandchildren.   Patient endorses paranoia that she noticed first approximately 2 years ago.  Reports paranoia worsening x 3 to 4 months to 1 year.  Reports intermittent feeling that when she is watching television that the show is about her.  She has destroyed a cell phone when she saw things on my phone that I thought nobody should know.  Patient reports feeling targeted by others.  She has believed that strangers have entered her home while she was away, moving items in home.  Patient identifies stimulant use triggering paranoia.  Sharyne denies suicidal and homicidal ideation.  She denies history of suicide attempts, denies history of nonsuicidal self-harm behavior.  There is no indication patient is responding to internal stimuli.  Natasha Roach reports previously diagnosed with schizophrenia 2 years ago.  She is not linked with outpatient psychiatry.  Denies medications to address mood.  She reports I was given medicine a few years ago, I took it 1 time.  The medicine is not for me.  Denies  history of inpatient psychiatric hospitalization.  Denies family mental health and addiction history.  Patient resides in [place] with her significant other.  She denies access to weapons.  She receives disability income related to schizophrenia diagnosis 2 years ago.  She endorses average sleep and appetite.  Patient is assessed by this nurse practitioner face-to-face.  She is seated in assessment area, no apparent distress.  She is alert and oriented, pleasant and cooperative during assessment.  She presents with depressed mood, tearful affect.  Observation admission and facility based crisis unit admission recommended/offered  Patient declined.  Patient offered support and encouragement.  She would like to join  chemical dependency intensive outpatient program.  Patient states I use because I am bored.  I need something to do.   Patient  educated and verbalizes understanding of mental health resources and other crisis services in the community. They are instructed to call 911 and present to the nearest emergency room should patient experience any suicidal/homicidal ideation, auditory/visual/hallucinations, or detrimental worsening of mental health condition.         Flowsheet Row ED from 03/18/2024 in [facility] ED from 12/24/2023 in [facility] Emergency Department at [facility] ED from 09/28/2023 in [facility]  C-SSRS RISK CATEGORY No Risk No Risk No Risk    Psychiatric Specialty Exam  Presentation  General Appearance:Appropriate for Environment; Casual  Eye Contact:Good  Speech:Clear and Coherent; Normal Rate  Speech Volume:Normal  Handedness:Right   Mood and Affect  Mood: Depressed  Affect: Depressed; Tearful   Thought Process  Thought Processes: Coherent; Goal Directed; Linear  Descriptions of Associations:Intact  Orientation:Full (Time, Place and Person)  Thought Content:Paranoid  Ideation  Diagnosis of Schizophrenia or Schizoaffective disorder in past: Yes  Duration of Psychotic Symptoms: Greater than six months  Hallucinations:None  Ideas of Reference:Paranoia  Suicidal Thoughts:No  Homicidal Thoughts:No   Sensorium  Memory: Immediate Good; Recent Fair  Judgment: Fair  Insight: Good   Executive Functions  Concentration: Good  Attention Span: Good  Recall: Good  Fund of Knowledge: Good  Language: Good   Psychomotor Activity  Psychomotor Activity: Normal   Assets  Assets: Communication Skills; Desire for Improvement; Financial Resources/Insurance; Housing; Physical Health; Resilience; Social Support   Sleep  Sleep: Fair  Number of hours:  6   Physical Exam: Physical Exam Vitals and nursing note reviewed.  Constitutional:      Appearance: Normal appearance. She is well-developed.  HENT:     Head: Normocephalic and atraumatic.     Nose: Nose normal.  Cardiovascular:     Rate and Rhythm: Normal rate and regular rhythm.     Heart sounds: Normal heart sounds.  Pulmonary:     Effort: Pulmonary effort is normal.     Breath sounds: Normal breath sounds.  Musculoskeletal:        General: Normal range of motion.     Cervical back: Normal range of motion.  Skin:    General: Skin is warm and dry.  Neurological:     Mental Status: She is alert and oriented to person, place, and time.  Psychiatric:        Attention and Perception: Attention and perception normal.        Mood and Affect: Mood is depressed. Affect is tearful.        Speech: Speech normal.        Behavior: Behavior normal. Behavior is cooperative.        Thought Content: Thought content is paranoid.        Cognition and Memory: Cognition and memory normal.    Review of Systems  Constitutional: Negative.   HENT: Negative.    Eyes: Negative.   Respiratory: Negative.    Cardiovascular: Negative.   Gastrointestinal: Negative.   Genitourinary: Negative.    Musculoskeletal: Negative.   Skin: Negative.   Neurological: Negative.   Psychiatric/Behavioral:  Positive for depression and substance abuse.    Blood pressure (!) 152/99, pulse 76, temperature 97.8 F (36.6 C), temperature source Oral, resp. rate 20, SpO2 98%. There is no height or weight on file to calculate BMI.  Musculoskeletal: Strength & Muscle Tone: within normal limits Gait & Station: normal Patient leans: N/A   [facility] MSE Discharge Disposition for Follow up and Recommendations: Based on my evaluation the patient does not appear to have an emergency medical condition and can be discharged with resources and follow up care in outpatient services for Medication Management, Individual Therapy, and Group Therapy Follow-up with outpatient psychiatry and individual counseling, resources provided. Follow-up with [facility] at [facility] behavioral health.  A referral has been initiated on your behalf, program staff will reach out to you for next steps.  Ellouise LITTIE Dawn, FNP 03/18/2024, 3:35 PM

## 2024-03-22 ENCOUNTER — Encounter (HOSPITAL_COMMUNITY): Payer: Self-pay | Admitting: Emergency Medicine

## 2024-03-22 ENCOUNTER — Emergency Department (HOSPITAL_COMMUNITY)

## 2024-03-22 ENCOUNTER — Inpatient Hospital Stay (HOSPITAL_COMMUNITY)
Admission: EM | Admit: 2024-03-22 | Discharge: 2024-03-29 | DRG: 065 | Disposition: A | Discharge status: Rehabilitation Facility | Attending: Internal Medicine | Admitting: Internal Medicine

## 2024-03-22 ENCOUNTER — Observation Stay (HOSPITAL_COMMUNITY)

## 2024-03-22 ENCOUNTER — Other Ambulatory Visit: Payer: Self-pay

## 2024-03-22 DIAGNOSIS — I10 Essential (primary) hypertension: Secondary | ICD-10-CM | POA: Diagnosis not present

## 2024-03-22 DIAGNOSIS — R200 Anesthesia of skin: Secondary | ICD-10-CM | POA: Diagnosis not present

## 2024-03-22 DIAGNOSIS — R29810 Facial weakness: Secondary | ICD-10-CM | POA: Diagnosis not present

## 2024-03-22 DIAGNOSIS — I639 Cerebral infarction, unspecified: Secondary | ICD-10-CM | POA: Diagnosis not present

## 2024-03-22 DIAGNOSIS — I6381 Other cerebral infarction due to occlusion or stenosis of small artery: Secondary | ICD-10-CM | POA: Diagnosis not present

## 2024-03-22 DIAGNOSIS — S0990XA Unspecified injury of head, initial encounter: Secondary | ICD-10-CM | POA: Diagnosis not present

## 2024-03-22 DIAGNOSIS — R29818 Other symptoms and signs involving the nervous system: Secondary | ICD-10-CM | POA: Diagnosis not present

## 2024-03-22 DIAGNOSIS — R2990 Unspecified symptoms and signs involving the nervous system: Secondary | ICD-10-CM | POA: Diagnosis present

## 2024-03-22 DIAGNOSIS — R29707 NIHSS score 7: Secondary | ICD-10-CM | POA: Diagnosis not present

## 2024-03-22 DIAGNOSIS — I2510 Atherosclerotic heart disease of native coronary artery without angina pectoris: Secondary | ICD-10-CM

## 2024-03-22 DIAGNOSIS — G459 Transient cerebral ischemic attack, unspecified: Secondary | ICD-10-CM | POA: Diagnosis not present

## 2024-03-22 DIAGNOSIS — R001 Bradycardia, unspecified: Secondary | ICD-10-CM | POA: Diagnosis not present

## 2024-03-22 DIAGNOSIS — E785 Hyperlipidemia, unspecified: Secondary | ICD-10-CM | POA: Diagnosis not present

## 2024-03-22 DIAGNOSIS — F17200 Nicotine dependence, unspecified, uncomplicated: Secondary | ICD-10-CM

## 2024-03-22 DIAGNOSIS — R531 Weakness: Secondary | ICD-10-CM | POA: Diagnosis not present

## 2024-03-22 DIAGNOSIS — I6389 Other cerebral infarction: Secondary | ICD-10-CM | POA: Diagnosis not present

## 2024-03-22 DIAGNOSIS — M6281 Muscle weakness (generalized): Secondary | ICD-10-CM | POA: Diagnosis not present

## 2024-03-22 DIAGNOSIS — R2689 Other abnormalities of gait and mobility: Secondary | ICD-10-CM | POA: Diagnosis not present

## 2024-03-22 LAB — I-STAT CHEM 8, ED
BUN: 18 mg/dL (ref 8–23)
Calcium, Ion: 1.14 mmol/L — ABNORMAL LOW (ref 1.15–1.40)
Chloride: 110 mmol/L (ref 98–111)
Creatinine, Ser: 0.7 mg/dL (ref 0.44–1.00)
Glucose, Bld: 117 mg/dL — ABNORMAL HIGH (ref 70–99)
HCT: 42 % (ref 36.0–46.0)
Hemoglobin: 14.3 g/dL (ref 12.0–15.0)
Potassium: 4.1 mmol/L (ref 3.5–5.1)
Sodium: 145 mmol/L (ref 135–145)
TCO2: 24 mmol/L (ref 22–32)

## 2024-03-22 LAB — DIFFERENTIAL
Abs Immature Granulocytes: 0.03 K/uL (ref 0.00–0.07)
Basophils Absolute: 0.1 K/uL (ref 0.0–0.1)
Basophils Relative: 0 %
Eosinophils Absolute: 0.2 K/uL (ref 0.0–0.5)
Eosinophils Relative: 2 %
Immature Granulocytes: 0 %
Lymphocytes Relative: 34 %
Lymphs Abs: 3.9 K/uL (ref 0.7–4.0)
Monocytes Absolute: 0.7 K/uL (ref 0.1–1.0)
Monocytes Relative: 6 %
Neutro Abs: 6.6 K/uL (ref 1.7–7.7)
Neutrophils Relative %: 58 %

## 2024-03-22 LAB — CBC
HCT: 43.7 % (ref 36.0–46.0)
Hemoglobin: 13.3 g/dL (ref 12.0–15.0)
MCH: 26.2 pg (ref 26.0–34.0)
MCHC: 30.4 g/dL (ref 30.0–36.0)
MCV: 86.2 fL (ref 80.0–100.0)
Platelets: 234 K/uL (ref 150–400)
RBC: 5.07 MIL/uL (ref 3.87–5.11)
RDW: 14.6 % (ref 11.5–15.5)
WBC: 11.5 K/uL — ABNORMAL HIGH (ref 4.0–10.5)
nRBC: 0 % (ref 0.0–0.2)

## 2024-03-22 LAB — COMPREHENSIVE METABOLIC PANEL WITH GFR
ALT: 19 U/L (ref 0–44)
AST: 19 U/L (ref 15–41)
Albumin: 3.5 g/dL (ref 3.5–5.0)
Alkaline Phosphatase: 99 U/L (ref 38–126)
Anion gap: 10 (ref 5–15)
BUN: 16 mg/dL (ref 8–23)
CO2: 22 mmol/L (ref 22–32)
Calcium: 8.8 mg/dL — ABNORMAL LOW (ref 8.9–10.3)
Chloride: 111 mmol/L (ref 98–111)
Creatinine, Ser: 0.69 mg/dL (ref 0.44–1.00)
GFR, Estimated: >60 mL/min (ref >60)
Glucose, Bld: 122 mg/dL — ABNORMAL HIGH (ref 70–99)
Potassium: 4.1 mmol/L (ref 3.5–5.1)
Sodium: 143 mmol/L (ref 135–145)
Total Bilirubin: 0.4 mg/dL (ref 0.0–1.2)
Total Protein: 5.7 g/dL — ABNORMAL LOW (ref 6.5–8.1)

## 2024-03-22 LAB — PROTIME-INR
INR: 0.9 (ref 0.8–1.2)
Prothrombin Time: 13.1 s (ref 11.4–15.2)

## 2024-03-22 LAB — MAGNESIUM: Magnesium: 2.1 mg/dL (ref 1.7–2.4)

## 2024-03-22 LAB — APTT: aPTT: 25 s (ref 24–36)

## 2024-03-22 LAB — RAPID URINE DRUG SCREEN, HOSP PERFORMED
Amphetamines: NOT DETECTED
Barbiturates: NOT DETECTED
Benzodiazepines: NOT DETECTED
Cocaine: POSITIVE — AB
Opiates: NOT DETECTED
Tetrahydrocannabinol: NOT DETECTED

## 2024-03-22 LAB — ETHANOL: Alcohol, Ethyl (B): <15 mg/dL (ref <15)

## 2024-03-22 LAB — HEMOGLOBIN A1C
Hgb A1c MFr Bld: 6.9 % — ABNORMAL HIGH (ref 4.8–5.6)
Mean Plasma Glucose: 151 mg/dL

## 2024-03-22 LAB — CBG MONITORING, ED
Glucose-Capillary: 123 mg/dL — ABNORMAL HIGH (ref 70–99)
Glucose-Capillary: 108 mg/dL — ABNORMAL HIGH (ref 70–99)

## 2024-03-22 MED ORDER — STROKE: EARLY STAGES OF RECOVERY BOOK
Freq: Once | Status: AC
  Filled 2024-03-22: qty 1

## 2024-03-22 MED ORDER — LORAZEPAM 0.5 MG PO TABS
0.5000 mg | ORAL_TABLET | Freq: Four times a day (QID) | ORAL | Status: DC | PRN
  Administered 2024-03-22 – 2024-03-28 (×7): 0.5 mg via ORAL
  Filled 2024-03-22 (×7): qty 1

## 2024-03-22 MED ORDER — INSULIN ASPART 100 UNIT/ML IJ SOLN: 0.0000 [IU] | Freq: Three times a day (TID) | INTRAMUSCULAR | Status: DC

## 2024-03-22 MED ORDER — ATORVASTATIN CALCIUM 80 MG PO TABS
80.0000 mg | ORAL_TABLET | Freq: Every evening | ORAL | Status: DC
  Administered 2024-03-22 – 2024-03-28 (×7): 80 mg via ORAL
  Filled 2024-03-22 (×7): qty 1

## 2024-03-22 MED ORDER — NITROGLYCERIN 0.4 MG SL SUBL: 0.4000 mg | SUBLINGUAL_TABLET | SUBLINGUAL | Status: DC | PRN

## 2024-03-22 MED ORDER — ENOXAPARIN SODIUM 40 MG/0.4ML IJ SOSY
40.0000 mg | PREFILLED_SYRINGE | Freq: Every day | INTRAMUSCULAR | Status: DC
  Administered 2024-03-22 – 2024-03-29 (×8): 40 mg via SUBCUTANEOUS
  Filled 2024-03-22 (×8): qty 0.4

## 2024-03-22 MED ORDER — IPRATROPIUM-ALBUTEROL 0.5-2.5 (3) MG/3ML IN SOLN: 3.0000 mL | Freq: Four times a day (QID) | RESPIRATORY_TRACT | Status: DC | PRN

## 2024-03-22 MED ORDER — ASPIRIN 325 MG PO TABS
325.0000 mg | ORAL_TABLET | Freq: Every day | ORAL | Status: DC
  Administered 2024-03-22 – 2024-03-24 (×3): 325 mg via ORAL
  Filled 2024-03-22 (×3): qty 1

## 2024-03-22 MED ORDER — ASPIRIN 300 MG RE SUPP: 300.0000 mg | Freq: Every day | RECTAL | Status: DC

## 2024-03-22 MED ORDER — CLOPIDOGREL BISULFATE 75 MG PO TABS
75.0000 mg | ORAL_TABLET | Freq: Every day | ORAL | Status: DC
  Administered 2024-03-22 – 2024-03-29 (×8): 75 mg via ORAL
  Filled 2024-03-22 (×8): qty 1

## 2024-03-22 MED ORDER — ACETAMINOPHEN 325 MG PO TABS: 650.0000 mg | ORAL_TABLET | Freq: Four times a day (QID) | ORAL | Status: DC | PRN

## 2024-03-22 MED ORDER — MELATONIN 3 MG PO TABS
3.0000 mg | ORAL_TABLET | Freq: Every evening | ORAL | Status: DC | PRN
  Administered 2024-03-26: 3 mg via ORAL
  Filled 2024-03-22: qty 1

## 2024-03-22 MED ORDER — IOHEXOL 350 MG/ML SOLN
75.0000 mL | Freq: Once | INTRAVENOUS | Status: AC | PRN
  Administered 2024-03-22: 75 mL via INTRAVENOUS

## 2024-03-22 MED ORDER — ACETAMINOPHEN 650 MG RE SUPP: 650.0000 mg | Freq: Four times a day (QID) | RECTAL | Status: DC | PRN

## 2024-03-22 MED ORDER — ONDANSETRON HCL 4 MG/2ML IJ SOLN: 4.0000 mg | Freq: Four times a day (QID) | INTRAMUSCULAR | Status: DC | PRN

## 2024-03-22 MED ORDER — SODIUM CHLORIDE 0.9 % IV SOLN: INTRAVENOUS | Status: AC

## 2024-03-22 MED ORDER — INSULIN ASPART 100 UNIT/ML IJ SOLN: 0.0000 [IU] | Freq: Every day | INTRAMUSCULAR | Status: DC

## 2024-03-22 NOTE — Consult Note (Signed)
 NEUROLOGY CONSULT NOTE   Date of service: March 22, 2024 Patient Name: MILIANI DEIKE MRN:  [id number] DOB:  02/15/1961 Chief Complaint: Right sided weakness-code stroke Requesting Provider: Lorette Mayo, MD  History of Present Illness  LATANYA HEMMER is a 63 y.o. female with hx of hypertension, hyperlipidemia, CAD, cocaine  abuse, tobacco abuse, presenting for evaluation of sudden onset of right-sided weakness and slurred speech. Symptoms started somewhere around 1 PM on Thanksgiving day 03/21/2024, which she initially attributed to her sciatica since this was predominantly right leg weakness with mild arm weakness.  At somewhere around midnight, she also noticed that the arm weakness was becoming more pronounced as well.  She also noticed some slurred speech.  EMS was called and they activated a code stroke with the thought process that the last known well was somewhere around midnight although on detailed history taking, her symptoms had already started by 1 PM on Thanksgiving day so the last known well was somewhere around noon on 03/21/2024 per the patient. She reports using cocaine  on the morning of 03/21/2024.  Denies any chest pain shortness of breath nausea vomiting.  Denies any sensory deficits. Denies any headache or vision changes at this time.  LKW: 12 PM on 03/21/2024 Modified rankin score: 0-Completely asymptomatic and back to baseline post- stroke IV Thrombolysis: Outside the window Endovascular thrombectomy: No-no ELVO  NIH stroke scale 7  ROS  Comprehensive ROS performed and pertinent positives documented in HPI   Past History   Past Medical History:  Diagnosis Date   CAD (coronary artery disease) 04/23/2016   a. 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 b. STEMI 03/2016 due to cocaine , occ mLAD s/p Synergy stent, EF 40% by cath, 55% by echo.   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 at time of STEMI 03/2016.    Past Surgical History:  Procedure Laterality Date   ABDOMINAL HYSTERECTOMY     BREAST SURGERY     abcess   CARDIAC CATHETERIZATION  2004   CARDIAC CATHETERIZATION N/A 04/21/2016   Procedure: Left Heart Cath and Coronary Angiography;  Surgeon: Debby DELENA Sor, MD;  Location: [facility] INVASIVE CV LAB;  Service: Cardiovascular;  Laterality: N/A;   CARDIAC CATHETERIZATION N/A 04/21/2016   Procedure: Coronary Stent Intervention;  Surgeon: Debby DELENA Sor, MD;  Location: [facility] INVASIVE CV LAB;  Service: Cardiovascular;  Laterality: N/A;   CORONARY STENT INTERVENTION N/A 10/27/2017   Procedure: CORONARY STENT INTERVENTION;  Surgeon: Jordan, Peter M, MD;  Location: [facility] INVASIVE CV LAB;  Service: Cardiovascular;  Laterality: N/A;   CORONARY/GRAFT ACUTE MI REVASCULARIZATION N/A 10/27/2017   Procedure: Coronary/Graft Acute MI Revascularization;  Surgeon: Jordan, Peter M, MD;  Location: [facility] INVASIVE CV LAB;  Service: Cardiovascular;  Laterality: N/A;   CORONARY/GRAFT ACUTE MI REVASCULARIZATION N/A 01/16/2018   Procedure: Coronary/Graft Acute MI Revascularization;  Surgeon: Jordan, Peter M, MD;  Location: [facility] INVASIVE CV LAB;  Service: Cardiovascular;  Laterality: N/A;   ELECTROPHYSIOLOGIC STUDY N/A 04/21/2016   Procedure: Cardioversion;  Surgeon: Debby DELENA Sor, MD;  Location: [facility] INVASIVE CV LAB;  Service:  Cardiovascular;  Laterality: N/A;   LEFT HEART CATH AND CORONARY ANGIOGRAPHY N/A 10/27/2017   Procedure: LEFT HEART CATH AND CORONARY ANGIOGRAPHY;  Surgeon: Jordan, Peter M, MD;  Location: [facility] INVASIVE CV LAB;  Service: Cardiovascular;  Laterality: N/A;   LEFT HEART CATH AND CORONARY  ANGIOGRAPHY N/A 01/16/2018   Procedure: LEFT HEART CATH AND CORONARY ANGIOGRAPHY;  Surgeon: Jordan, Peter M, MD;  Location: [facility] INVASIVE CV LAB;  Service: Cardiovascular;  Laterality: N/A;    Family History: Family History  Problem Relation Age of Onset   CAD Father     Social History  reports that she has been smoking cigarettes. She has a 40 pack-year smoking history. She has never used smokeless tobacco. She reports current drug use. Drug: Cocaine . She reports that she does not drink alcohol.  No Known Allergies  Medications  No current facility-administered medications for this encounter.  Current Outpatient Medications:    amoxicillin -clavulanate (AUGMENTIN ) 875-125 MG tablet, Take 1 tablet by mouth every 12 (twelve) hours., Disp: 14 tablet, Rfl: 0   aspirin  EC 81 MG tablet, Take 1 tablet (81 mg total) by mouth daily., Disp: 90 tablet, Rfl: 3   atorvastatin  (LIPITOR ) 80 MG tablet, Take 1 tablet (80 mg total) by mouth every evening., Disp: 90 tablet, Rfl: 2   clopidogrel  (PLAVIX ) 75 MG tablet, Take 1 tablet (75 mg total) by mouth daily., Disp: 90 tablet, Rfl: 0   nitroGLYCERIN  (NITROSTAT ) 0.4 MG SL tablet, Place 1 tablet (0.4 mg total) under the tongue every 5 (five) minutes x 3 doses as needed for chest pain., Disp: 75 tablet, Rfl: 2   oxyCODONE -acetaminophen  (PERCOCET/ROXICET) 5-325 MG tablet, Take 1 tablet by mouth every 6 (six) hours as needed for severe pain (pain score 7-10)., Disp: 15 tablet, Rfl: 0  Vitals   Vitals:   31-Mar-2024 0404  Weight: 77.8 kg    Body mass index is 26.86 kg/m.   Physical Exam   General: Awake alert in no distress HEENT: Normocephalic atraumatic Lungs: Clear Cardiovascular: Regular rate rhythm Neurological exam Awake alert oriented x 3 Mild dysarthria No aphasia Cranial nerves II to XII: Pupils equal round react light, extraocular movements intact, visual fields full, facial sensation intact, face mildly asymmetric at rest with flattening  of the right nasolabial fold, otherwise intact. Motor examination with nearly flaccid right upper extremity with minimal movement just at the shoulder.  0 out of 5 grip strength on the right.  Right leg barely antigravity at 3/5-cannot hold for more than a second or 2 without falling to bed.  Left side is full strength. Sensation examination reveals no deficits Coordination examination reveals no dysmetria on the left, unable to test on the right  Labs/Imaging/Neurodiagnostic studies   CBC:  Recent Labs  Lab Mar 31, 2024 0339 31-Mar-2024 0342  WBC 11.5*  --   NEUTROABS 6.6  --   HGB 13.3 14.3  HCT 43.7 42.0  MCV 86.2  --   PLT 234  --    Basic Metabolic Panel:  Lab Results  Component Value Date   NA 145 03/31/2024   K 4.1 2024-03-31   CO2 22 12/24/2023   GLUCOSE 117 (H) 03-31-24   BUN 18 03-31-24   CREATININE 0.70 03-31-2024   CALCIUM  9.2 12/24/2023   GFRNONAA >60 12/24/2023   GFRAA 115 06/18/2018   Lipid Panel:  Lab Results  Component Value Date   LDLCALC 56 06/18/2018   HgbA1c:  Lab Results  Component Value Date   HGBA1C 6.9 (H) 01/16/2018  Urine Drug Screen:     Component Value Date/Time   LABOPIA NONE DETECTED 04/21/2016 2328   COCAINSCRNUR POSITIVE (A) 04/21/2016 2328   COCAINSCRNUR See Final Results 12/31/2015 2009   LABBENZ POSITIVE (A) 04/21/2016 2328   AMPHETMU NONE DETECTED 04/21/2016 2328   THCU NONE DETECTED 04/21/2016 2328   LABBARB NONE DETECTED 04/21/2016 2328    Alcohol Level     Component Value Date/Time   ETH <5 04/21/2016 1956   INR  Lab Results  Component Value Date   INR 0.95 01/16/2018   APTT  Lab Results  Component Value Date   APTT 24 04/21/2016   Imaging personally reviewed Noncontrast head CT with no acute findings CTA head and neck with no emergent LVO.  Mild distal left M1 stenosis.  ASSESSMENT   KABRINA CHRISTIANO is a 63 y.o. female past medical history of multiple cerebrovascular risk factors-CAD, hypertension,  hyperlipidemia, tobacco abuse, cocaine  abuse, presenting for evaluation of sudden onset of right face arm and leg weakness. 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 Needs further stroke workup. Outside the window for IV thrombolysis.  Clinical examination and CT angiography head and neck unremarkable for LVO.  Impression: Acute ischemic stroke, likely small vessel etiology due to multiple cerebrovascular risk factors including cocaine  abuse  RECOMMENDATIONS  Admit to hospitalist Frequent neurochecks Telemetry Aspirin  81 and Plavix  75 for now for 3 weeks, followed by aspirin  only High intensity statin MRI brain without contrast 2D echo A1c Lipid panel Therapy assessments Allow permissive hypertension-treat only if systolic BP is greater than 220 on a as needed basis for next 24 to 48 hours.  After that can normalize blood pressures. Plan discussed with Dr. Lorette Stroke team to follow  ______________________________________________________________________    Signed, Eligio Lav, MD [facility]

## 2024-03-22 NOTE — ED Notes (Signed)
 Pt stated if I can't use my right arm I'd just rather be dead

## 2024-03-22 NOTE — Code Documentation (Signed)
 Responded to Code Stroke called at 0315 for R sided weakness, [id number]-1200 yesterday. Pt arrived at 0334, CBG-123, NIH-7, CT head negative for acute changes. TNK not given-pt outside window. CTA pending. Plan: admission for stroke workup. Please complete VS/neuro checks(including NIH) q2h x 12h, then q4h.

## 2024-03-22 NOTE — H&P (Addendum)
 History and Physical    Patient: Natasha Roach FMW:[id number] DOB: [birth year] DOA: 03/22/2024 DOS: the patient was seen and examined on 03/22/2024 PCP: Catalina Bare, MD  Patient coming from: Home-lives with husband  Chief Complaint:  Chief Complaint  Patient presents with   Code Stroke   HPI: Natasha Roach is a 63 y.o. 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  Prior to admission patient was on Plavix , baby aspirin  and high-dose Lipitor .  Patient began experiencing slurred speech as well as right-sided weakness around 1 PM on Thanksgiving day.  She initially thought the lower extremity symptoms were due to her longstanding sciatica which also causes severe cramping-like sensations in her right lower extremity.  Unfortunately symptoms progressed and she was unable to move her right upper extremity or bear weight so she presented to the ED for further evaluation and treatment.  CTA head/neck showed no evidence of acute CVA although there was mild stenosis within the distal M1 segment of the left middle cerebral artery but no large vessel occlusion, hemodynamically significant stenosis or aneurysm in the head or neck.  Labs were unremarkable.  UDS pending at time of dictation.  Hospitalist service asked to evaluate the patient for admission.   Review of Systems: Patient reports continued smoking up to 1 pack/day.  States last used cocaine  on the date of symptoms.  Continues to have issues with right lower extremity sciatica causing cramping sensation in the leg as well as neuropathic type pain with palpation over the right calf.  Currently denies chest pain, blurred vision, sensation of curtain over the eye.  Denies shortness of  breath or chronic cough.   Past Medical History:  Diagnosis Date   CAD (coronary artery disease) 04/23/2016   a. 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 b. STEMI 03/2016 due to cocaine , occ mLAD s/p Synergy stent, EF 40% by cath, 55% by echo.   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 at time of STEMI 03/2016.   Past Surgical History:  Procedure Laterality Date   ABDOMINAL HYSTERECTOMY     BREAST SURGERY     abcess   CARDIAC CATHETERIZATION  2004   CARDIAC CATHETERIZATION N/A 04/21/2016   Procedure: Left Heart Cath and Coronary Angiography;  Surgeon: Debby DELENA Sor, MD;  Location: [facility] INVASIVE CV LAB;  Service: Cardiovascular;  Laterality: N/A;   CARDIAC CATHETERIZATION N/A 04/21/2016   Procedure: Coronary Stent Intervention;  Surgeon: Debby DELENA Sor, MD;  Location: [facility] INVASIVE CV LAB;  Service: Cardiovascular;  Laterality: N/A;   CORONARY STENT INTERVENTION N/A 10/27/2017   Procedure: CORONARY STENT INTERVENTION;  Surgeon: Jordan, Peter M, MD;  Location: [facility] INVASIVE CV LAB;  Service: Cardiovascular;  Laterality: N/A;   CORONARY/GRAFT ACUTE MI REVASCULARIZATION N/A 10/27/2017   Procedure: Coronary/Graft Acute MI Revascularization;  Surgeon: Jordan, Peter M, MD;  Location: [facility] INVASIVE CV  LAB;  Service: Cardiovascular;  Laterality: N/A;   CORONARY/GRAFT ACUTE MI REVASCULARIZATION N/A 01/16/2018   Procedure: Coronary/Graft Acute MI Revascularization;  Surgeon: Jordan, Peter M, MD;  Location: [facility] INVASIVE CV LAB;  Service: Cardiovascular;  Laterality: N/A;   ELECTROPHYSIOLOGIC STUDY N/A 04/21/2016   Procedure: Cardioversion;  Surgeon:  Debby DELENA Sor, MD;  Location: [facility] INVASIVE CV LAB;  Service: Cardiovascular;  Laterality: N/A;   LEFT HEART CATH AND CORONARY ANGIOGRAPHY N/A 10/27/2017   Procedure: LEFT HEART CATH AND CORONARY ANGIOGRAPHY;  Surgeon: Jordan, Peter M, MD;  Location: [facility] INVASIVE CV LAB;  Service: Cardiovascular;  Laterality: N/A;   LEFT HEART CATH AND CORONARY ANGIOGRAPHY N/A 01/16/2018   Procedure: LEFT HEART CATH AND CORONARY ANGIOGRAPHY;  Surgeon: Jordan, Peter M, MD;  Location: [facility] INVASIVE CV LAB;  Service: Cardiovascular;  Laterality: N/A;   Social History:  reports that she has been smoking cigarettes. She has a 40 pack-year smoking history. She has never used smokeless tobacco. She reports current drug use. Drug: Cocaine . She reports that she does not drink alcohol.  No Known Allergies  Family History  Problem Relation Age of Onset   CAD Father     Prior to Admission medications   Medication Sig Start Date End Date Taking? Authorizing Provider  amoxicillin -clavulanate (AUGMENTIN ) 875-125 MG tablet Take 1 tablet by mouth every 12 (twelve) hours. 12/24/23   Dasie Faden, MD  aspirin  EC 81 MG tablet Take 1 tablet (81 mg total) by mouth daily. 08/17/23   Sor Debby DELENA, MD  atorvastatin  (LIPITOR ) 80 MG tablet Take 1 tablet (80 mg total) by mouth every evening. 08/17/23   Sor Debby DELENA, MD  clopidogrel  (PLAVIX ) 75 MG tablet Take 1 tablet (75 mg total) by mouth daily. 08/17/23   Sor Debby DELENA, MD  nitroGLYCERIN  (NITROSTAT ) 0.4 MG SL tablet Place 1 tablet (0.4 mg total) under the tongue every 5 (five) minutes x 3 doses as needed for chest pain. 08/17/23   Sor Debby DELENA, MD  oxyCODONE -acetaminophen  (PERCOCET/ROXICET) 5-325 MG tablet Take 1 tablet by mouth every 6 (six) hours as needed for severe pain (pain score 7-10). 12/24/23   Dasie Faden, MD    Physical Exam: Vitals:   03/22/24 0404 03/22/24 0432 03/22/24 0530 03/22/24 0615  BP:   (!) 140/90 131/87  Pulse:   (!) 48 (!) 49  Resp:   17 17  Temp:       TempSrc:      SpO2:   100% 100%  Weight: 77.8 kg     Height:  5' 7 (1.702 m)     Constitutional: NAD, calm, comfortable Eyes: PERRL, lids and conjunctivae normal-EOMs intact Respiratory: clear to auscultation bilaterally, no crackles, she does have faint scattered expiratory wheezing, normal respiratory effort. No accessory muscle use.  Room air Cardiovascular: Regular but bradycardic rate and rhythm, no murmurs / rubs / gallops. No extremity edema. 2+ pedal pulses. No carotid bruits.  Abdomen: no tenderness, no masses palpated. No hepatosplenomegaly. Bowel sounds positive.  Musculoskeletal: no clubbing / cyanosis. No joint deformity upper and lower extremities. Good ROM, no contractures. Normal muscle tone.  Skin: no rashes, lesions, ulcers. No induration Neurologic: CN 2-12 grossly intact although notable for slurred speech, slight tongue deviation to the right but otherwise able to lift tongue to palate. Sensation intact, Strength 5/5 left side.  Dense hemiplegia with retained sensation right upper extremity.  With much effort is able to flex right lower extremity but unable to lift right lower extremity off  bed.  Sensation remains intact Psychiatric: Normal judgment and insight. Alert and oriented x 3. Normal mood.     Data Reviewed:  Sodium 145, potassium 4.1, chloride 110, glucose 117, BUN 18, creatinine 0.7, ionized calcium  1.14  WBC 11,500, hemoglobin 13.3, platelets 234,000, coags are normal  Imaging as described above  Assessment and Plan: Suspected acute CVA Symptoms began at 1 PM on 11/27 but patient did not present to the ED until 4:30 AM on 11/28 therefore outside of window for any potential interventions Slurred speech and right upper extremity dense hemiplegia as well as marked motor weakness of right lower extremity has persisted which is concerning for acute stroke Appreciate assistance of the neurology team MRI brain pending Echocardiogram ordered Will continue  baby aspirin  and Plavix  noting was on this regimen prior to admission Continue preadmission Lipitor  80 mg daily Lipid panel and hemoglobin A1c pending Permissive hypertension and only treat SBP if greater than 220 PT/OT/SLP evaluation-given slurred speech high risk for aspiration event Fall risk given lower extremity motor weakness  History of CAD/prior MI with stent Currently asymptomatic Notable for bradycardia therefore not on beta-blocker Continue aspirin  and Plavix   Ischemic cardiomyopathy with recovered EF 2017 per cath EF 40% 2019 Echocardiogram 50 to 55% without regional wall motion abnormalities Follow-up on echocardiogram ordered this admission  History of Post MI ventricular tachycardia with associated prolonged QTc Bradycardia Check EKG and if QTc prolonged will need to discontinue Zofran   Polysubstance abuse Admits to smoking 1 pack/day-counseled regarding cessation-deferred nicotine patch-subtle wheezing noted on exam therefore will allow for as needed DuoNebs Admits to regular use of cocaine  and states she used cocaine  prior to onset of neurological symptoms-counseled regarding absolute cessation  Hypertension Not on medications prior to admission Permissive hypertension as above  Dyslipidemia On Lipitor  80 mg daily prior to admission    Advance Care Planning:   Code Status: Full Code   VTE prophylaxis: Lovenox   Consults: Neurology  Family Communication: Husband at bedside  Severity of Illness: The appropriate patient status for this patient is OBSERVATION. Observation status is judged to be reasonable and necessary in order to provide the required intensity of service to ensure the patient's safety. The patient's presenting symptoms, physical exam findings, and initial radiographic and laboratory data in the context of their medical condition is felt to place them at decreased risk for further clinical deterioration. Furthermore, it is anticipated that the  patient will be medically stable for discharge from the hospital within 2 midnights of admission.   Author: Isaiah Lever, NP 03/22/2024 7:00 AM  For on call review [email or web address].

## 2024-03-22 NOTE — Evaluation (Signed)
 Physical Therapy Evaluation Patient Details Name: Natasha Roach MRN: [id number] DOB: 22-Feb-1961 Today's Date: 03/22/2024  History of Present Illness  63 yo female code stroke with dysarthria facial droop and R side weakness. Cocaine  (+) NIH 7 MRI:  acute small vessel bland infarct in the left periventricular white matter  extending to the posterior limb of the left internal capsule.Moderate cerebral white matter disease.PMHx: suicidal, HTN, HLD, CAD, cocaine  abuse, tobacco abuse, MI  Clinical Impression  Pt admitted with/for s/s of stroke, has improved incrementally over this last day, no needing min to light mod assist..  Pt currently limited functionally due to the problems listed. ( See problems list.)   Pt will benefit from PT to maximize function and safety in order to get ready for next venue listed below.         If plan is discharge home, recommend the following: A lot of help with walking and/or transfers;A lot of help with bathing/dressing/bathroom;Assistance with cooking/housework;Help with stairs or ramp for entrance   Can travel by private vehicle        Equipment Recommendations Wheelchair (measurements PT);Wheelchair cushion (measurements PT);Rolling walker (2 wheels)  Recommendations for Other Services       Functional Status Assessment       Precautions / Restrictions Precautions Precautions: Fall      Mobility  Bed Mobility Overal bed mobility: Needs Assistance Bed Mobility: Rolling, Sidelying to Sit Rolling: Min assist Sidelying to sit: Min assist       General bed mobility comments: cues for sequencing and rolling L assist before assisting L over and up onto L elbow.    Transfers Overall transfer level: Needs assistance Equipment used: 1 person hand held assist, Rolling walker (2 wheels) Transfers: Sit to/from Stand, Bed to chair/wheelchair/BSC Sit to Stand: Mod assist     Squat pivot transfers: Min assist, Mod assist     General  transfer comment: cues for sequencing, staying in squat for the transfer,  demo of tranfectory and  hand placement safety when standing.    Ambulation/Gait             Pre-gait activities: standing at EOB x2 with PT assisting w/shift, L knee control exercise, unweighting, foot placement    Stairs            Wheelchair Mobility     Tilt Bed    Modified Rankin (Stroke Patients Only) Modified Rankin (Stroke Patients Only) Pre-Morbid Rankin Score: No symptoms Modified Rankin: Moderately severe disability     Balance Overall balance assessment: Needs assistance Sitting-balance support: Single extremity supported, Feet supported, No upper extremity supported Sitting balance-Leahy Scale: Fair Sitting balance - Comments: sitting EOB and scooting forward and side to side without assist.  W/shifting activity with movement significantly outside BOS, but pt able to return to upright sitting     Standing balance-Leahy Scale: Poor Standing balance comment: pt able to stand with guarding R LE and no full  blocking, pt able to balance without external support at the knee or R side                             Pertinent Vitals/Pain Pain Assessment Pain Assessment: No/denies pain    Home Living                          Prior Function  Extremity/Trunk Assessment                Communication   Communication Communication: No apparent difficulties    Cognition Arousal: Alert Behavior During Therapy: WFL for tasks assessed/performed   PT - Cognitive impairments: No apparent impairments, No family/caregiver present to determine baseline                         Following commands: Intact       Cueing Cueing Techniques: Verbal cues, Tactile cues     General Comments General comments (skin integrity, edema, etc.): vss    Exercises Other Exercises Other Exercises: hip/knee flex/ext ROM with graded  resistance x10 incl graded assist/resistance R LE   Assessment/Plan    PT Assessment    PT Problem List         PT Treatment Interventions      PT Goals (Current goals can be found in the Care Plan section)  Acute Rehab PT Goals Patient Stated Goal: back indepedent    Frequency Min 4X/week     Co-evaluation               AM-PAC PT 6 Clicks Mobility  Outcome Measure Help needed turning from your back to your side while in a flat bed without using bedrails?: A Lot Help needed moving from lying on your back to sitting on the side of a flat bed without using bedrails?: A Lot Help needed moving to and from a bed to a chair (including a wheelchair)?: A Lot Help needed standing up from a chair using your arms (e.g., wheelchair or bedside chair)?: A Lot Help needed to walk in hospital room?: A Lot Help needed climbing 3-5 steps with a railing? : A Lot 6 Click Score: 12    End of Session   Activity Tolerance: Patient tolerated treatment well Patient left: in chair;with call bell/phone within reach;with chair alarm set Nurse Communication: Mobility status PT Visit Diagnosis: Other symptoms and signs involving the nervous system (M70.101)    Time: 8393-8346 PT Time Calculation (min) (ACUTE ONLY): 47 min   Charges:   PT Evaluation $PT Eval Moderate Complexity: 1 Mod PT Treatments $Therapeutic Activity: 8-22 mins $Neuromuscular Re-education: 8-22 mins PT General Charges $$ ACUTE PT VISIT: 1 Visit         03/22/2024  India HERO., PT Acute Rehabilitation Services [phone]  (office)  Vinie GAILS Courney Garrod 03/22/2024, 7:31 PM

## 2024-03-22 NOTE — Plan of Care (Signed)

## 2024-03-22 NOTE — Progress Notes (Signed)
 Echo attempted again at 2:20, PT eval in progress. Will reattempt as schedule permits.  [facility] Shanae Luo RDCS

## 2024-03-22 NOTE — ED Triage Notes (Signed)
 Pt arrived via [facility] as a code stroke. Pt noticed right lower leg weakness yesterday at 1346 but thought it was just her sciatica. At 0100 this morning pt noticed profound right upper and lower extremity weakness and a right sided facial droop, and slurred speech. BP 150/80.Pt endorses cocaine  use yesterday morning.

## 2024-03-22 NOTE — Evaluation (Signed)
 Occupational Therapy Evaluation Patient Details Name: Natasha Roach MRN: [id number] DOB: August 18, 1960 Today's Date: 03/22/2024   History of Present Illness   63 yo female code stroke with dysarthria facial droop and R side weakness. Cocaine  (+) NIH 7 MRI:  acute small vessel bland infarct in the left periventricular white matter  extending to the posterior limb of the left internal capsule.Moderate cerebral white matter disease.PMHx: suicidal, HTN, HLD, CAD, cocaine  abuse, tobacco abuse, MI     Clinical Impressions This 63 yo female admitted with above presents to acute OT with PLOF of being totally independent with basic ADLs and IADLs without use of AD for mobility. She currently is setup-total A for basic ADLs. She will continue to benefit from acute OT with follow up from intensive inpatient follow-up therapy, >3 hours/day.      If plan is discharge home, recommend the following:   A lot of help with walking and/or transfers;A lot of help with bathing/dressing/bathroom;Assistance with cooking/housework;Help with stairs or ramp for entrance;Assist for transportation     Functional Status Assessment   Patient has had a recent decline in their functional status and demonstrates the ability to make significant improvements in function in a reasonable and predictable amount of time.     Equipment Recommendations   Other (comment) (TBD next venue)      Precautions/Restrictions   Precautions Precautions: Fall Restrictions Weight Bearing Restrictions Per Provider Order: No     Mobility Bed Mobility Overal bed mobility: Needs Assistance Bed Mobility: Supine to Sit, Sit to Supine, Rolling Rolling: Min assist Sidelying to sit: Min assist Supine to sit: Mod assist     General bed mobility comments: Incfreased time and effort for all movements    Transfers Overall transfer level: Needs assistance Equipment used: 1 person hand held assist Transfers: Sit  to/from Stand Sit to Stand: Mod assist                  Balance Overall balance assessment: Needs assistance Sitting-balance support: Single extremity supported, Feet supported Sitting balance-Leahy Scale: Fair   Postural control: Right lateral lean (mild, can self correct with cues) Standing balance support: Single extremity supported Standing balance-Leahy Scale: Poor                             ADL either performed or assessed with clinical judgement   ADL Overall ADL's : Needs assistance/impaired Eating/Feeding: Set up;Bed level   Grooming: Moderate assistance;Bed level   Upper Body Bathing: Moderate assistance;Bed level   Lower Body Bathing: Total assistance;Bed level   Upper Body Dressing : Maximal assistance;Bed level   Lower Body Dressing: Total assistance;Bed level     Toilet Transfer Details (indicate cue type and reason): Attmeptd to take step with LLE and right knee buckled                 Vision Baseline Vision/History: 1 Wears glasses Ability to See in Adequate Light: 0 Adequate Patient Visual Report: No change from baseline Additional Comments: need to assess            Pertinent Vitals/Pain Pain Assessment Pain Assessment: No/denies pain     Extremity/Trunk Assessment Upper Extremity Assessment Upper Extremity Assessment: Right hand dominant;RUE deficits/detail RUE Deficits / Details: trace movement shoulder and elbow; flaccid distally (full PROM) RUE Coordination: decreased gross motor           Communication Communication Communication: Impaired Factors Affecting Communication: Difficulty expressing  self;Reduced clarity of speech   Cognition Arousal: Alert Behavior During Therapy: Anxious Cognition: Cognition impaired     Awareness: Online awareness impaired, Intellectual awareness intact   Attention impairment (select first level of impairment): Sustained attention Executive functioning impairment (select  all impairments): Problem solving                   Following commands: Intact       Cueing  General Comments   Cueing Techniques: Verbal cues;Tactile cues  vss           Home Living Family/patient expects to be discharged to:: Private residence Living Arrangements: Spouse/significant other Available Help at Discharge: Family;Available PRN/intermittently Type of Home: House Home Access: Level entry     Home Layout: One level         Bathroom Toilet: Standard     Home Equipment: None          Prior Functioning/Environment Prior Level of Function : Independent/Modified Independent                    OT Problem List: Decreased strength;Decreased range of motion;Impaired balance (sitting and/or standing);Decreased coordination;Decreased safety awareness;Impaired UE functional use;Impaired tone   OT Treatment/Interventions: Self-care/ADL training;Patient/family education;DME and/or AE instruction;Balance training;Therapeutic activities;Therapeutic exercise      OT Goals(Current goals can be found in the care plan section)   Acute Rehab OT Goals Patient Stated Goal: to be able to walk OT Goal Formulation: With patient Time For Goal Achievement: 04/05/24 Potential to Achieve Goals: Good   OT Frequency:  Min 2X/week       AM-PAC OT 6 Clicks Daily Activity     Outcome Measure Help from another person eating meals?: A Little Help from another person taking care of personal grooming?: A Lot Help from another person toileting, which includes using toliet, bedpan, or urinal?: A Lot Help from another person bathing (including washing, rinsing, drying)?: A Lot Help from another person to put on and taking off regular upper body clothing?: A Lot Help from another person to put on and taking off regular lower body clothing?: Total 6 Click Score: 12   End of Session Equipment Utilized During Treatment: Gait belt Nurse Communication:  (pt with  anxiousness in realizing what her deficits are)  Activity Tolerance: Patient tolerated treatment well Patient left: in bed;with call bell/phone within reach;with bed alarm set  OT Visit Diagnosis: Unsteadiness on feet (R26.81);Other abnormalities of gait and mobility (R26.89);Muscle weakness (generalized) (M62.81);Other symptoms and signs involving cognitive function;Hemiplegia and hemiparesis Hemiplegia - Right/Left: Right Hemiplegia - dominant/non-dominant: Dominant Hemiplegia - caused by: Cerebral infarction                Time: 1406-1450 OT Time Calculation (min): 44 min Charges:  OT General Charges $OT Visit: 1 Visit OT Evaluation $OT Eval Moderate Complexity: 1 Mod OT Treatments $Self Care/Home Management : 23-37 mins  .Donny BECKER OT Acute Rehabilitation Services Office [phone]    [place] Dorothyann Distel 03/22/2024, 8:52 PM

## 2024-03-22 NOTE — ED Notes (Signed)
 Hospitalist at bedside

## 2024-03-22 NOTE — ED Notes (Signed)
 Pt to MRI

## 2024-03-22 NOTE — ED Notes (Signed)
 Please inform the patient that her daughter called from [place] her name is Natasha Roach

## 2024-03-22 NOTE — Progress Notes (Signed)
  Carryover admission to the Day Admitter.  I discussed this case with the EDP, Dr. Lorette.  Per these discussions:   This is a 63 year old female with history of recurrent cocaine  abuse, who is being admitted for further stroke workup in the setting of new onset right-sided weakness as well as dysarthria, starting at noon on 03/21/2024.  Patient also notes that she most recently used cocaine  on 03/21/2024.  Continues to experience right-sided weakness, including right upper extremity weakness.   Urinary drug screen is currently pending.  CT head showed no evidence of acute intracranial process, will CTA head and neck showed no evidence of LVO.   EDP d/w on-call neurology, Dr. Voncile, who has consulted and is concerned for acute ischemic stroke, recommending hospitalist admission for further stroke evaluation, including MRI brain. Outside of window for TNK.   I have placed an order for  observation for  further evaluation management of the above.  I have placed some additional preliminary admit orders via the adult multi-morbid admission order set. I will defer to the admitting hospitalist orders for MRI brain as well as orders regarding Dr. Ruthine additional recommendations.     Eva Pore, DO Hospitalist

## 2024-03-22 NOTE — Progress Notes (Signed)
 STROKE TEAM PROGRESS NOTE   SUBJECTIVE (INTERVAL HISTORY) No family is at the bedside.  Overall her condition is gradually improving. Her R sided weakness is improving, same as her R facial droop. Educated on smoking cessation and cocaine  cessation.    OBJECTIVE Temp:  [97.5 F (36.4 C)-98.3 F (36.8 C)] 98.3 F (36.8 C) (11/28 1014) Pulse Rate:  [48-57] 57 (11/28 1014) Cardiac Rhythm: Sinus bradycardia (11/28 0655) Resp:  [17-18] 18 (11/28 1014) BP: (130-140)/(70-90) 130/70 (11/28 1014) SpO2:  [100 %] 100 % (11/28 1014) Weight:  [77.8 kg] 77.8 kg (11/28 0404)  Recent Labs  Lab 03/22/24 0336 03/22/24 0712  GLUCAP 123* 108*   Recent Labs  Lab 03/22/24 0339 03/22/24 0342  NA 143 145  K 4.1 4.1  CL 111 110  CO2 22  --   GLUCOSE 122* 117*  BUN 16 18  CREATININE 0.69 0.70  CALCIUM  8.8*  --   MG 2.1  --    Recent Labs  Lab 03/22/24 0339  AST 19  ALT 19  ALKPHOS 99  BILITOT 0.4  PROT 5.7*  ALBUMIN 3.5   Recent Labs  Lab 03/22/24 0339 03/22/24 0342  WBC 11.5*  --   NEUTROABS 6.6  --   HGB 13.3 14.3  HCT 43.7 42.0  MCV 86.2  --   PLT 234  --    No results for input(s): CKTOTAL, CKMB, CKMBINDEX, TROPONINI in the last 168 hours. Recent Labs    03/22/24 0339  LABPROT 13.1  INR 0.9   No results for input(s): COLORURINE, LABSPEC, PHURINE, GLUCOSEU, HGBUR, BILIRUBINUR, KETONESUR, PROTEINUR, UROBILINOGEN, NITRITE, LEUKOCYTESUR in the last 72 hours.  Invalid input(s): APPERANCEUR     Component Value Date/Time   CHOL 135 06/18/2018 1144   TRIG 150 (H) 06/18/2018 1144   HDL 49 06/18/2018 1144   CHOLHDL 2.8 06/18/2018 1144   CHOLHDL 4.0 01/17/2018 0212   VLDL 44 (H) 01/17/2018 0212   LDLCALC 56 06/18/2018 1144   Lab Results  Component Value Date   HGBA1C 6.9 (H) 01/16/2018      Component Value Date/Time   LABOPIA NONE DETECTED 03/22/2024 0907   COCAINSCRNUR POSITIVE (A) 03/22/2024 0907   COCAINSCRNUR See Final Results  12/31/2015 2009   LABBENZ NONE DETECTED 03/22/2024 0907   AMPHETMU NONE DETECTED 03/22/2024 0907   THCU NONE DETECTED 03/22/2024 0907   LABBARB NONE DETECTED 03/22/2024 0907    Recent Labs  Lab 03/22/24 0342  ETH <15    I have personally reviewed the radiological images below and agree with the radiology interpretations.  MR BRAIN WO CONTRAST Result Date: 03/22/2024 EXAM: MRI BRAIN WITHOUT CONTRAST 03/22/2024 08:19:37 AM TECHNIQUE: Multiplanar multisequence MRI of the head/brain was performed without the administration of intravenous contrast. COMPARISON: CT of the head dated 03/22/2024. CLINICAL HISTORY: Neuro deficit, acute, stroke suspected. FINDINGS: BRAIN AND VENTRICLES: There is restricted diffusion present within the left periventricular white matter extending to the posterior limb of the left internal capsule. It is consistent with an acute small vessel bland infarct. There is age-related atrophy. Moderate cerebral white matter disease. No intracranial hemorrhage. No mass. No midline shift. No hydrocephalus. The sella is unremarkable. Normal flow voids. ORBITS: No acute abnormality. SINUSES AND MASTOIDS: No acute abnormality. BONES AND SOFT TISSUES: Normal marrow signal. No acute soft tissue abnormality. IMPRESSION: 1. Acute small vessel bland infarct in the left periventricular white matter extending to the posterior limb of the left internal capsule. 2. Moderate cerebral white matter disease. 3. Age-related atrophy.  Electronically signed by: Evalene Coho MD 03/22/2024 08:51 AM EST RP Workstation: [id number]   CT ANGIO HEAD NECK W WO CM (CODE STROKE) Result Date: 03/22/2024 EXAM: CTA HEAD AND NECK WITHOUT AND WITH 03/22/2024 04:02:27 AM TECHNIQUE: CTA of the head and neck was performed without and with the administration of 75 mL of iohexol  (OMNIPAQUE ) 350 MG/ML injection. Multiplanar 2D and/or 3D reformatted images are provided for review. Automated exposure control, iterative  reconstruction, and/or weight based adjustment of the mA/kV was utilized to reduce the radiation dose to as low as reasonably achievable. Stenosis of the internal carotid arteries measured using NASCET criteria. COMPARISON: CT of the head dated 03/22/2024 CLINICAL HISTORY: Stroke, follow up FINDINGS: CTA NECK: AORTIC ARCH AND ARCH VESSELS: There is mild calcific atheromatous disease within the aortic arch and origin of the left subclavian artery. There is common origin of the brachiocephalic artery and left common carotid artery. No dissection or arterial injury. No significant stenosis of the brachiocephalic or subclavian arteries. CERVICAL CAROTID ARTERIES: There is mild calcific plaque present within the carotid bulbs, with no associated stenosis. The cervical segments of the internal carotid arteries are tortuous, but normal in caliber. No dissection, arterial injury, or hemodynamically significant stenosis by NASCET criteria. CERVICAL VERTEBRAL ARTERIES: The vertebral arteries are codominant and normal in caliber throughout their respective courses. No dissection, arterial injury, or significant stenosis. LUNGS AND MEDIASTINUM: Unremarkable. SOFT TISSUES: No acute abnormality. BONES: No acute abnormality. CTA HEAD: ANTERIOR CIRCULATION: There is mild calcific atheromatous disease within the carotid siphons. There is no flow-limiting stenosis. There is mild stenosis within the distal M1 segment of the left middle cerebral artery. No significant stenosis of the anterior cerebral arteries. No aneurysm. POSTERIOR CIRCULATION: There is fetal type origin of the posterior cerebral arteries with a diminutive basilar artery and diminutive P1 segments. No significant stenosis of the posterior cerebral arteries. No significant stenosis of the basilar artery. No significant stenosis of the vertebral arteries. No aneurysm. OTHER: No dural venous sinus thrombosis on this non-dedicated study. No large vessel occlusion or  flow-limiting stenosis. The above findings were communicated to Dr. Voncile at 4:14 am 03/22/2024. IMPRESSION: 1. Mild stenosis within the distal M1 segment of the left middle cerebral artery; no large vessel occlusion, hemodynamically significant stenosis, or aneurysm in the head or neck 2. Atherosclerotic calcifications involving the aortic arch, carotid bulbs, and carotid siphons without flow-limiting stenosis; tortuous but normal-caliber cervical internal carotid arteries; codominant vertebral arteries 3. Anatomic variants including common origin of the brachiocephalic and left common carotid arteries and fetal-type origin of the posterior cerebral arteries with diminutive basilar artery and P1 segments 4. Findings communicated to Dr. Arora at 4:14 am on 03/22/2024 Electronically signed by: Evalene Coho MD 03/22/2024 04:15 AM EST RP Workstation: [id number]   CT HEAD CODE STROKE WO CONTRAST (LKW 0-4.5h, LVO 0-24h) Result Date: 03/22/2024 CLINICAL DATA:  Code stroke. Initial evaluation for acute neuro deficit, stroke suspected, right-sided weakness. EXAM: CT HEAD WITHOUT CONTRAST TECHNIQUE: Contiguous axial images were obtained from the base of the skull through the vertex without intravenous contrast. RADIATION DOSE REDUCTION: This exam was performed according to the departmental dose-optimization program which includes automated exposure control, adjustment of the mA and/or kV according to patient size and/or use of iterative reconstruction technique. COMPARISON:  CT from 04/20/2007. FINDINGS: Brain: Cerebral volume within normal limits. No acute intracranial hemorrhage. No acute large vessel territory infarct. No mass lesion, midline shift or mass effect. No hydrocephalus or extra-axial fluid collection. Vascular: No  abnormal hyperdense vessel. Scattered vascular calcifications noted within the carotid siphons. Skull: Scalp soft tissues demonstrate no acute finding. Calvarium intact. Sinuses/Orbits:  Globes orbital soft tissues within normal limits. Paranasal sinuses are largely clear. No mastoid effusion. Other: None. ASPECTS [facility] Stroke Program Early CT Score) - Ganglionic level infarction (caudate, lentiform nuclei, internal capsule, insula, M1-M3 cortex): 7 - Supraganglionic infarction (M4-M6 cortex): 3 Total score (0-10 with 10 being normal): 10 IMPRESSION: 1. Negative head CT.  No acute intracranial abnormality. 2. ASPECTS is 10. These results were communicated to Dr. Arora at 3:57 am on 03/22/2024 by text page via the [facility] messaging system. Electronically Signed   By: Morene Hoard M.D.   On: 03/22/2024 03:59     PHYSICAL EXAM  Temp:  [97.5 F (36.4 C)-98.3 F (36.8 C)] 98.3 F (36.8 C) (11/28 1014) Pulse Rate:  [48-57] 57 (11/28 1014) Resp:  [17-18] 18 (11/28 1014) BP: (130-140)/(70-90) 130/70 (11/28 1014) SpO2:  [100 %] 100 % (11/28 1014) Weight:  [77.8 kg] 77.8 kg (11/28 0404)  General - Well nourished, well developed, in no apparent distress.  Ophthalmologic - fundi not visualized due to noncooperation.  Cardiovascular - Regular rhythm and rate.  Neuro - awake, alert, eyes open, orientated to age, place, time. No aphasia, fluent language, following all simple commands. Able to name and repeat. No gaze palsy, tracking bilaterally, visual field full. R facial droop. Tongue midline. LUE and LLE 5/5, RUE bicep 3/5, tricep 2/5, finger movement 0/5. LLE proximal 3-/5, distally 2/5. Sensation symmetrical bilaterally, L FTN intact, gait not tested.     ASSESSMENT/PLAN Natasha Roach is a 63 y.o. female with history of hyperlipidemia, CAD, smoker, cocaine  user admitted for right-sided weakness, slurred speech. No TNK given due to outside window.    Stroke:  left BG/CR infarct secondary to small vessel disease source due to uncontrolled risk factors CT no acute abnormality CTA head and neck bilateral fetal PCAs, atherosclerosis ICA bulbs, siphons and  aorta MRI left BG/CR infarct 2D Echo  pending LDL 49 HgbA1c 6.9 UDS + cocaine  lovenox  for VTE prophylaxis aspirin  81 mg daily and clopidogrel  75 mg daily prior to admission, now on aspirin  81 mg daily and clopidogrel  75 mg daily.  Patient counseled to be compliant with her antithrombotic medications Ongoing aggressive stroke risk factor management Therapy recommendations:  CIR Disposition:  pending  Diabetes HgbA1c 6.9 goal < 7.0 Controlled CBG monitoring SSI DM education and close PCP follow up  BP management Stable Long term BP goal normotensive  Hyperlipidemia Home meds: Lipitor  80 LDL 49, goal < 70 Now on lipitor  80 Continue statin at discharge  Tobacco abuse Current smoker Smoking cessation counseling provided Nicotine patch provided Pt is willing to quit  Cocaine  abuse Patient admitted cocaine  use UDS positive for Cocaine  Cessation education provided  Other Stroke Risk Factors Coronary artery disease  Other Active Problems Leukocytosis WBC 11.5  Hospital day # 0    Natasha Cummins, MD PhD Stroke Neurology 03/22/2024 11:51 AM    To contact Stroke Continuity provider, please refer to [email or web address]. After hours, contact General Neurology

## 2024-03-22 NOTE — ED Notes (Signed)
 Pt feeling a lot of stress and sadness r/t current physical deficits. This RN gave emotional support.

## 2024-03-22 NOTE — ED Provider Notes (Signed)
 [place] EMERGENCY DEPARTMENT AT [facility] Provider Note   CSN: [id number] Arrival date & time: 03/22/24  9665  An emergency department physician performed an initial assessment on this suspected stroke patient at 63.  Patient presents with: Code Stroke   CADENCE MINTON is a 63 y.o. female.   Patient brought in as code stroke secondary to dysarthria, facial droop and right-sided weakness.  Last known normal for sure was 12 PM.  She does use corrective cane quite a bit and used yesterday evening.  Symptoms were sudden onset but not sure what time.  No injuries, falls or any other issues or recent illnesses.  Of note well into her ER stay patient made a vaguely suicidal comment.  And then after that apparently the nurse called this speak with the daughter and the daughter stated the patient says that she is suicidal quite often but has never really attempted suicide.  The daughter thinks that she should be involuntarily committed secondary to her drug use and because of her passing suicidality.        Prior to Admission medications   Medication Sig Start Date End Date Taking? Authorizing Provider  amoxicillin -clavulanate (AUGMENTIN ) 875-125 MG tablet Take 1 tablet by mouth every 12 (twelve) hours. 12/24/23   Dasie Faden, MD  aspirin  EC 81 MG tablet Take 1 tablet (81 mg total) by mouth daily. 08/17/23   Burnard Debby DELENA, MD  atorvastatin  (LIPITOR ) 80 MG tablet Take 1 tablet (80 mg total) by mouth every evening. 08/17/23   Burnard Debby DELENA, MD  clopidogrel  (PLAVIX ) 75 MG tablet Take 1 tablet (75 mg total) by mouth daily. 08/17/23   Burnard Debby DELENA, MD  nitroGLYCERIN  (NITROSTAT ) 0.4 MG SL tablet Place 1 tablet (0.4 mg total) under the tongue every 5 (five) minutes x 3 doses as needed for chest pain. 08/17/23   Burnard Debby DELENA, MD  oxyCODONE -acetaminophen  (PERCOCET/ROXICET) 5-325 MG tablet Take 1 tablet by mouth every 6 (six) hours as needed for severe pain (pain score  7-10). 12/24/23   Dasie Faden, MD    Allergies: Patient has no known allergies.    Review of Systems  Updated Vital Signs BP (!) 140/90   Pulse (!) 48   Temp (!) 97.5 F (36.4 C) (Axillary)   Resp 17   Ht 5' 7 (1.702 m)   Wt 77.8 kg   SpO2 100%   BMI 26.86 kg/m   Physical Exam Vitals and nursing note reviewed.  Constitutional:      Appearance: She is well-developed.  HENT:     Head: Normocephalic and atraumatic.  Cardiovascular:     Rate and Rhythm: Normal rate and regular rhythm.  Pulmonary:     Effort: No respiratory distress.     Breath sounds: No stridor.  Abdominal:     General: There is no distension.  Musculoskeletal:     Cervical back: Normal range of motion.  Skin:    General: Skin is warm and dry.  Neurological:     Mental Status: She is alert.     Comments: Right sided arm weakness.  No appreciable facial droop.  She does have abnormal sounding voice but is hard to tell if this dysarthric or her baseline however she feels like it is still abnormal.     (all labs ordered are listed, but only abnormal results are displayed) Labs Reviewed  CBC - Abnormal; Notable for the following components:      Result Value   WBC 11.5 (*)  All other components within normal limits  COMPREHENSIVE METABOLIC PANEL WITH GFR - Abnormal; Notable for the following components:   Glucose, Bld 122 (*)    Calcium  8.8 (*)    Total Protein 5.7 (*)    All other components within normal limits  CBG MONITORING, ED - Abnormal; Notable for the following components:   Glucose-Capillary 123 (*)    All other components within normal limits  I-STAT CHEM 8, ED - Abnormal; Notable for the following components:   Glucose, Bld 117 (*)    Calcium , Ion 1.14 (*)    All other components within normal limits  PROTIME-INR  APTT  DIFFERENTIAL  ETHANOL  MAGNESIUM   RAPID URINE DRUG SCREEN, HOSP PERFORMED    EKG: None  Radiology: CT ANGIO HEAD NECK W WO CM (CODE STROKE) Result  Date: 03/22/2024 EXAM: CTA HEAD AND NECK WITHOUT AND WITH 03/22/2024 04:02:27 AM TECHNIQUE: CTA of the head and neck was performed without and with the administration of 75 mL of iohexol  (OMNIPAQUE ) 350 MG/ML injection. Multiplanar 2D and/or 3D reformatted images are provided for review. Automated exposure control, iterative reconstruction, and/or weight based adjustment of the mA/kV was utilized to reduce the radiation dose to as low as reasonably achievable. Stenosis of the internal carotid arteries measured using NASCET criteria. COMPARISON: CT of the head dated 03/22/2024 CLINICAL HISTORY: Stroke, follow up FINDINGS: CTA NECK: AORTIC ARCH AND ARCH VESSELS: There is mild calcific atheromatous disease within the aortic arch and origin of the left subclavian artery. There is common origin of the brachiocephalic artery and left common carotid artery. No dissection or arterial injury. No significant stenosis of the brachiocephalic or subclavian arteries. CERVICAL CAROTID ARTERIES: There is mild calcific plaque present within the carotid bulbs, with no associated stenosis. The cervical segments of the internal carotid arteries are tortuous, but normal in caliber. No dissection, arterial injury, or hemodynamically significant stenosis by NASCET criteria. CERVICAL VERTEBRAL ARTERIES: The vertebral arteries are codominant and normal in caliber throughout their respective courses. No dissection, arterial injury, or significant stenosis. LUNGS AND MEDIASTINUM: Unremarkable. SOFT TISSUES: No acute abnormality. BONES: No acute abnormality. CTA HEAD: ANTERIOR CIRCULATION: There is mild calcific atheromatous disease within the carotid siphons. There is no flow-limiting stenosis. There is mild stenosis within the distal M1 segment of the left middle cerebral artery. No significant stenosis of the anterior cerebral arteries. No aneurysm. POSTERIOR CIRCULATION: There is fetal type origin of the posterior cerebral arteries with a  diminutive basilar artery and diminutive P1 segments. No significant stenosis of the posterior cerebral arteries. No significant stenosis of the basilar artery. No significant stenosis of the vertebral arteries. No aneurysm. OTHER: No dural venous sinus thrombosis on this non-dedicated study. No large vessel occlusion or flow-limiting stenosis. The above findings were communicated to Dr. Voncile at 4:14 am 03/22/2024. IMPRESSION: 1. Mild stenosis within the distal M1 segment of the left middle cerebral artery; no large vessel occlusion, hemodynamically significant stenosis, or aneurysm in the head or neck 2. Atherosclerotic calcifications involving the aortic arch, carotid bulbs, and carotid siphons without flow-limiting stenosis; tortuous but normal-caliber cervical internal carotid arteries; codominant vertebral arteries 3. Anatomic variants including common origin of the brachiocephalic and left common carotid arteries and fetal-type origin of the posterior cerebral arteries with diminutive basilar artery and P1 segments 4. Findings communicated to Dr. Arora at 4:14 am on 03/22/2024 Electronically signed by: Evalene Coho MD 03/22/2024 04:15 AM EST RP Workstation: [id number]   CT HEAD CODE STROKE WO CONTRAST (LKW 0-4.5h,  LVO 0-24h) Result Date: 03/22/2024 CLINICAL DATA:  Code stroke. Initial evaluation for acute neuro deficit, stroke suspected, right-sided weakness. EXAM: CT HEAD WITHOUT CONTRAST TECHNIQUE: Contiguous axial images were obtained from the base of the skull through the vertex without intravenous contrast. RADIATION DOSE REDUCTION: This exam was performed according to the departmental dose-optimization program which includes automated exposure control, adjustment of the mA and/or kV according to patient size and/or use of iterative reconstruction technique. COMPARISON:  CT from 04/20/2007. FINDINGS: Brain: Cerebral volume within normal limits. No acute intracranial hemorrhage. No acute large  vessel territory infarct. No mass lesion, midline shift or mass effect. No hydrocephalus or extra-axial fluid collection. Vascular: No abnormal hyperdense vessel. Scattered vascular calcifications noted within the carotid siphons. Skull: Scalp soft tissues demonstrate no acute finding. Calvarium intact. Sinuses/Orbits: Globes orbital soft tissues within normal limits. Paranasal sinuses are largely clear. No mastoid effusion. Other: None. ASPECTS [facility] Stroke Program Early CT Score) - Ganglionic level infarction (caudate, lentiform nuclei, internal capsule, insula, M1-M3 cortex): 7 - Supraganglionic infarction (M4-M6 cortex): 3 Total score (0-10 with 10 being normal): 10 IMPRESSION: 1. Negative head CT.  No acute intracranial abnormality. 2. ASPECTS is 10. These results were communicated to Dr. Arora at 3:57 am on 03/22/2024 by text page via the [facility] messaging system. Electronically Signed   By: Morene Hoard M.D.   On: 03/22/2024 03:59     .Critical Care  Performed by: Lorette Mayo, MD Authorized by: Lorette Mayo, MD   Critical care provider statement:    Critical care time (minutes):  30   Critical care was necessary to treat or prevent imminent or life-threatening deterioration of the following conditions:  CNS failure or compromise   Critical care was time spent personally by me on the following activities:  Development of treatment plan with patient or surrogate, discussions with consultants, evaluation of patient's response to treatment, examination of patient, ordering and review of laboratory studies, ordering and review of radiographic studies, ordering and performing treatments and interventions, pulse oximetry, re-evaluation of patient's condition and review of old charts    Medications Ordered in the ED  acetaminophen  (TYLENOL ) tablet 650 mg (has no administration in time range)    Or  acetaminophen  (TYLENOL ) suppository 650 mg (has no administration in time range)   melatonin tablet 3 mg (has no administration in time range)  ondansetron  (ZOFRAN ) injection 4 mg (has no administration in time range)  iohexol  (OMNIPAQUE ) 350 MG/ML injection 75 mL (75 mLs Intravenous Contrast Given 03/22/24 0402)                                    Medical Decision Making Amount and/or Complexity of Data Reviewed Labs: ordered. Radiology: ordered.  Risk Decision regarding hospitalization.   Patient will outside the window for TNK.  Neurology assessed and thinks this is likely a stroke based on clinical status however CT scans were unremarkable for any acute processes.  Neurology recommended hospitalist admission for continued workup.  I discussed with Dr. Kayla her and also mentioned the patient's need for psychiatric clearance after medical clearance.  Patient stable at time of admission.     Final diagnoses:  Acute ischemic stroke [facility])    ED Discharge Orders     None          Nida Manfredi, Mayo, MD 03/22/24 [phone]

## 2024-03-22 NOTE — Progress Notes (Signed)
 Echo attempted at 1pm, patient moving upstairs. Will reattempt as schedule permits.  [facility] Lindsey Demonte RDCS

## 2024-03-22 NOTE — ED Notes (Signed)
 Pt returned from MRI

## 2024-03-23 ENCOUNTER — Inpatient Hospital Stay (HOSPITAL_COMMUNITY)

## 2024-03-23 DIAGNOSIS — E785 Hyperlipidemia, unspecified: Secondary | ICD-10-CM

## 2024-03-23 DIAGNOSIS — F1410 Cocaine abuse, uncomplicated: Secondary | ICD-10-CM

## 2024-03-23 DIAGNOSIS — F17210 Nicotine dependence, cigarettes, uncomplicated: Secondary | ICD-10-CM

## 2024-03-23 DIAGNOSIS — I6389 Other cerebral infarction: Secondary | ICD-10-CM

## 2024-03-23 DIAGNOSIS — E119 Type 2 diabetes mellitus without complications: Secondary | ICD-10-CM | POA: Diagnosis not present

## 2024-03-23 DIAGNOSIS — I639 Cerebral infarction, unspecified: Secondary | ICD-10-CM | POA: Diagnosis not present

## 2024-03-23 LAB — LIPID PANEL
Cholesterol: 109 mg/dL (ref 0–200)
HDL: 42 mg/dL (ref >40)
LDL Cholesterol: 49 mg/dL (ref 0–99)
Total CHOL/HDL Ratio: 2.6 ratio
Triglycerides: 91 mg/dL (ref <150)
VLDL: 18 mg/dL (ref 0–40)

## 2024-03-23 LAB — ECHOCARDIOGRAM COMPLETE
Area-P 1/2: 2.69 cm2
Height: 67 in
S' Lateral: 2.4 cm
Single Plane A2C EF: 69.3 %
Weight: 2744.29 [oz_av]

## 2024-03-23 MED ORDER — PANTOPRAZOLE SODIUM 40 MG PO TBEC
40.0000 mg | DELAYED_RELEASE_TABLET | Freq: Every day | ORAL | Status: DC
  Administered 2024-03-23 – 2024-03-29 (×7): 40 mg via ORAL
  Filled 2024-03-23 (×7): qty 1

## 2024-03-23 NOTE — Progress Notes (Signed)
 Inpatient Rehab Admissions Coordinator Note:   Per therapy patient was screened for CIR candidacy by Sianna Garofano SHAUNNA Yvone Cohens, CCC-SLP. At this time, pt appears to be a potential candidate for CIR. I will place an order for rehab consult for full assessment, per our protocol.  Please contact me any with questions.SABRA Tinnie Yvone Cohens, MS, CCC-SLP Admissions Coordinator [phone] 03/23/24 5:13 PM

## 2024-03-23 NOTE — Progress Notes (Signed)
  Echocardiogram 2D Echocardiogram has been performed.  Koleen KANDICE Popper, RDCS 03/23/2024, 1:56 PM

## 2024-03-23 NOTE — Plan of Care (Signed)

## 2024-03-23 NOTE — Progress Notes (Signed)
 Physical Therapy Treatment Patient Details Name: Natasha Roach MRN: [id number] DOB: [birth year] Today's Date: 03/23/2024   History of Present Illness 63 yo female code stroke with dysarthria facial droop and R side weakness. Cocaine  (+) NIH 7 MRI:  acute small vessel bland infarct in the left periventricular white matter  extending to the posterior limb of the left internal capsule.Moderate cerebral white matter disease.PMHx: suicidal, HTN, HLD, CAD, cocaine  abuse, tobacco abuse, MI    PT Comments  Pt received in supine and agreeable to session. Pt demonstrates good motivation and progress towards mobility goals. Pt able to tolerate multiple standing trials and take steps to the recliner this session. Pt demonstrates R knee instability requiring blocking throughout. Pt able to use RW with assist to place and manage RUE. Pt requires cues for safety due to decreased awareness of R side positioning. Pt able to perform static standing marches with assist for weight shifting. Pt continues to benefit from PT services to progress toward functional mobility goals.     If plan is discharge home, recommend the following: A lot of help with walking and/or transfers;A lot of help with bathing/dressing/bathroom;Assistance with cooking/housework;Help with stairs or ramp for entrance   Can travel by private vehicle        Equipment Recommendations  Wheelchair (measurements PT);Wheelchair cushion (measurements PT);Rolling walker (2 wheels)    Recommendations for Other Services       Precautions / Restrictions Precautions Precautions: Fall Restrictions Weight Bearing Restrictions Per Provider Order: No     Mobility  Bed Mobility Overal bed mobility: Needs Assistance Bed Mobility: Supine to Sit     Supine to sit: Min assist     General bed mobility comments: Increased time and min A for trunk elevation    Transfers Overall transfer level: Needs assistance Equipment used: Rolling  walker (2 wheels) Transfers: Sit to/from Stand, Bed to chair/wheelchair/BSC Sit to Stand: Min assist   Step pivot transfers: Mod assist       General transfer comment: STS from EOB and recliner with min A for rise and stability due to slight impulsivity and decreased awareness. R knee blocked throughout due to buckling and hyperextension. assist for balance and RW management. Cues for sequencing.    Ambulation/Gait               General Gait Details: deferred due to no +2   Stairs             Wheelchair Mobility     Tilt Bed    Modified Rankin (Stroke Patients Only) Modified Rankin (Stroke Patients Only) Pre-Morbid Rankin Score: No symptoms Modified Rankin: Moderately severe disability     Balance Overall balance assessment: Needs assistance Sitting-balance support: Single extremity supported, Feet supported Sitting balance-Leahy Scale: Fair Sitting balance - Comments: CGA sitting EOB   Standing balance support: Single extremity supported, Bilateral upper extremity supported, During functional activity, Reliant on assistive device for balance Standing balance-Leahy Scale: Poor Standing balance comment: reliant on UE and external support                            Communication Communication Communication: Impaired Factors Affecting Communication: Difficulty expressing self;Reduced clarity of speech  Cognition Arousal: Alert Behavior During Therapy: WFL for tasks assessed/performed   PT - Cognitive impairments: No apparent impairments, No family/caregiver present to determine baseline  Following commands: Intact      Cueing Cueing Techniques: Verbal cues, Tactile cues  Exercises General Exercises - Lower Extremity Long Arc Quad: AROM, Seated, Right, 5 reps Heel Slides: AROM, Supine, Right, 5 reps Other Exercises Other Exercises: BLE static standing marches with R knee blocked and assist for weight  shifting    General Comments        Pertinent Vitals/Pain Pain Assessment Pain Assessment: No/denies pain     PT Goals (current goals can now be found in the care plan section) Acute Rehab PT Goals Patient Stated Goal: back independent Progress towards PT goals: Progressing toward goals    Frequency    Min 4X/week       AM-PAC PT 6 Clicks Mobility   Outcome Measure  Help needed turning from your back to your side while in a flat bed without using bedrails?: A Little Help needed moving from lying on your back to sitting on the side of a flat bed without using bedrails?: A Little Help needed moving to and from a bed to a chair (including a wheelchair)?: A Lot Help needed standing up from a chair using your arms (e.g., wheelchair or bedside chair)?: A Lot Help needed to walk in hospital room?: Total Help needed climbing 3-5 steps with a railing? : Total 6 Click Score: 12    End of Session Equipment Utilized During Treatment: Gait belt Activity Tolerance: Patient tolerated treatment well Patient left: in chair;with call bell/phone within reach;with chair alarm set Nurse Communication: Mobility status PT Visit Diagnosis: Other symptoms and signs involving the nervous system (R29.898)     Time: 8857-8794 PT Time Calculation (min) (ACUTE ONLY): 23 min  Charges:    $Therapeutic Activity: 23-37 mins PT General Charges $$ ACUTE PT VISIT: 1 Visit                    Darryle George, PTA Acute Rehabilitation Services Secure Chat Preferred  [phone]    Darryle George 03/23/2024, 12:51 PM

## 2024-03-23 NOTE — Progress Notes (Signed)
 PROGRESS NOTE    Natasha Roach  FMW:[id number] DOB: 1960-06-01 DOA: 03/22/2024 PCP: Catalina Bare, MD   Chief Complaint  Patient presents with   Code Stroke    Brief Narrative:   Natasha Roach is a 63 y.o. 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  Prior to admission patient was on Plavix , baby aspirin  and high-dose Lipitor .  Patient presents to ED due to slurred speech, right-sided weakness, MRI brain significant for acute CVA, urine drug screen positive for cocaine , she is admitted for further workup.    Assessment & Plan:   Principal Problem:   Stroke-like symptoms   Acute CVA -MRI brain significant for acute CVA -CTA head and neck significant for stenosis and M1 segment of left MCA, no large vessel occlusion, but atherosclerosis calcification in aortic arch, carotid bulbs . - 2D echo is pending -A1c 6.9 -LDL is 49 -Monitor on telemetry -Allow for permissive hypertension -PT/OT/SLP consulted, recommendation for CIR -Currently on aspirin  and Plavix , awaiting further recommendation from neurology   History of CAD/prior MI with stent Ischemic cardiomyopathy with recovered EF History of ventricular tachycardia post MI associated with prolonged QTc Currently asymptomatic Notable for bradycardia therefore not on beta-blocker Continue aspirin  and Plavix  DC within normal limit   Cocaine  use Tobacco abuse -Counseled   Hypertension Not on medications prior to admission Permissive hypertension as above   Dyslipidemia -LDL is 49 on Lipitor  80 mg daily prior to admission, will continue   Diabetes mellitus -A1c 6.9, which is diagnostic of DM, continue with carb modified diet       DVT prophylaxis:  (Lovenox ) Code Status: (Full code) Family Communication: (None at bedside Disposition:   Status is: Inpatient    Consultants:  Neurology  Subjective:  No significant change in her right-sided weakness  Objective: Vitals:   03/23/24 0400 03/23/24 0445 03/23/24 0817 03/23/24 1105  BP: (!) 151/99  (!) 157/80 (!) 169/96  Pulse: (!) 45 61 (!) 53 67  Resp: 10 (!) 23 20   Temp: 97.9 F (36.6 C)  98.6 F (37 C)   TempSrc: Oral  Oral   SpO2: 100% 100% 96% 99%  Weight:      Height:        Intake/Output Summary (Last 24 hours) at 03/23/2024 1141 Last data filed at 03/23/2024 0900 Gross per 24 hour  Intake 250 ml  Output 700 ml  Net -450 ml   Filed Weights   03/22/24 0404  Weight: 77.8 kg    Examination:  Awake Alert, Oriented X 3, significant right-sided weakness Symmetrical Chest wall movement, Good air movement bilaterally, CTAB RRR,No Gallops,Rubs or new Murmurs, No Parasternal Heave +ve B.Sounds, Abd Soft, No tenderness, No rebound - guarding or rigidity. No Cyanosis, Clubbing or edema, No new Rash or bruise       Data Reviewed: I have personally reviewed following labs and imaging studies  CBC: Recent Labs  Lab 03/22/24 0339 03/22/24 0342  WBC 11.5*  --   NEUTROABS 6.6  --   HGB 13.3 14.3  HCT 43.7 42.0  MCV 86.2  --   PLT 234  --     Basic Metabolic Panel: Recent Labs  Lab 03/22/24 0339 03/22/24 0342  NA 143 145  K 4.1 4.1  CL 111  110  CO2 22  --   GLUCOSE 122* 117*  BUN 16 18  CREATININE 0.69 0.70  CALCIUM  8.8*  --   MG 2.1  --     GFR: Estimated Creatinine Clearance: 78.4 mL/min (by C-G formula based on SCr of 0.7 mg/dL).  Liver Function Tests: Recent Labs  Lab 03/22/24 0339  AST 19  ALT 19  ALKPHOS 99  BILITOT 0.4  PROT 5.7*  ALBUMIN 3.5    CBG: Recent Labs  Lab 03/22/24 0336 03/22/24 0712  GLUCAP 123* 108*     No results found for this or any previous visit (from the past 240 hours).       Radiology  Studies: MR BRAIN WO CONTRAST Result Date: 03/22/2024 EXAM: MRI BRAIN WITHOUT CONTRAST 03/22/2024 08:19:37 AM TECHNIQUE: Multiplanar multisequence MRI of the head/brain was performed without the administration of intravenous contrast. COMPARISON: CT of the head dated 03/22/2024. CLINICAL HISTORY: Neuro deficit, acute, stroke suspected. FINDINGS: BRAIN AND VENTRICLES: There is restricted diffusion present within the left periventricular white matter extending to the posterior limb of the left internal capsule. It is consistent with an acute small vessel bland infarct. There is age-related atrophy. Moderate cerebral white matter disease. No intracranial hemorrhage. No mass. No midline shift. No hydrocephalus. The sella is unremarkable. Normal flow voids. ORBITS: No acute abnormality. SINUSES AND MASTOIDS: No acute abnormality. BONES AND SOFT TISSUES: Normal marrow signal. No acute soft tissue abnormality. IMPRESSION: 1. Acute small vessel bland infarct in the left periventricular white matter extending to the posterior limb of the left internal capsule. 2. Moderate cerebral white matter disease. 3. Age-related atrophy. Electronically signed by: Evalene Coho MD 03/22/2024 08:51 AM EST RP Workstation: [id number]   CT ANGIO HEAD NECK W WO CM (CODE STROKE) Result Date: 03/22/2024 EXAM: CTA HEAD AND NECK WITHOUT AND WITH 03/22/2024 04:02:27 AM TECHNIQUE: CTA of the head and neck was performed without and with the administration of 75 mL of iohexol  (OMNIPAQUE ) 350 MG/ML injection. Multiplanar 2D and/or 3D reformatted images are provided for review. Automated exposure control, iterative reconstruction, and/or weight based adjustment of the mA/kV was utilized to reduce the radiation dose to as low as reasonably achievable. Stenosis of the internal carotid arteries measured using NASCET criteria. COMPARISON: CT of the head dated 03/22/2024 CLINICAL HISTORY: Stroke, follow up FINDINGS: CTA NECK: AORTIC ARCH AND ARCH  VESSELS: There is mild calcific atheromatous disease within the aortic arch and origin of the left subclavian artery. There is common origin of the brachiocephalic artery and left common carotid artery. No dissection or arterial injury. No significant stenosis of the brachiocephalic or subclavian arteries. CERVICAL CAROTID ARTERIES: There is mild calcific plaque present within the carotid bulbs, with no associated stenosis. The cervical segments of the internal carotid arteries are tortuous, but normal in caliber. No dissection, arterial injury, or hemodynamically significant stenosis by NASCET criteria. CERVICAL VERTEBRAL ARTERIES: The vertebral arteries are codominant and normal in caliber throughout their respective courses. No dissection, arterial injury, or significant stenosis. LUNGS AND MEDIASTINUM: Unremarkable. SOFT TISSUES: No acute abnormality. BONES: No acute abnormality. CTA HEAD: ANTERIOR CIRCULATION: There is mild calcific atheromatous disease within the carotid siphons. There is no flow-limiting stenosis. There is mild stenosis within the distal M1 segment of the left middle cerebral artery. No significant stenosis of the anterior cerebral arteries. No aneurysm. POSTERIOR CIRCULATION: There is fetal type origin of the posterior cerebral arteries with a diminutive basilar artery and diminutive P1 segments. No significant stenosis of the posterior cerebral  arteries. No significant stenosis of the basilar artery. No significant stenosis of the vertebral arteries. No aneurysm. OTHER: No dural venous sinus thrombosis on this non-dedicated study. No large vessel occlusion or flow-limiting stenosis. The above findings were communicated to Dr. Voncile at 4:14 am 03/22/2024. IMPRESSION: 1. Mild stenosis within the distal M1 segment of the left middle cerebral artery; no large vessel occlusion, hemodynamically significant stenosis, or aneurysm in the head or neck 2. Atherosclerotic calcifications involving the  aortic arch, carotid bulbs, and carotid siphons without flow-limiting stenosis; tortuous but normal-caliber cervical internal carotid arteries; codominant vertebral arteries 3. Anatomic variants including common origin of the brachiocephalic and left common carotid arteries and fetal-type origin of the posterior cerebral arteries with diminutive basilar artery and P1 segments 4. Findings communicated to Dr. Arora at 4:14 am on 03/22/2024 Electronically signed by: Evalene Coho MD 03/22/2024 04:15 AM EST RP Workstation: [id number]   CT HEAD CODE STROKE WO CONTRAST (LKW 0-4.5h, LVO 0-24h) Result Date: 03/22/2024 CLINICAL DATA:  Code stroke. Initial evaluation for acute neuro deficit, stroke suspected, right-sided weakness. EXAM: CT HEAD WITHOUT CONTRAST TECHNIQUE: Contiguous axial images were obtained from the base of the skull through the vertex without intravenous contrast. RADIATION DOSE REDUCTION: This exam was performed according to the departmental dose-optimization program which includes automated exposure control, adjustment of the mA and/or kV according to patient size and/or use of iterative reconstruction technique. COMPARISON:  CT from 04/20/2007. FINDINGS: Brain: Cerebral volume within normal limits. No acute intracranial hemorrhage. No acute large vessel territory infarct. No mass lesion, midline shift or mass effect. No hydrocephalus or extra-axial fluid collection. Vascular: No abnormal hyperdense vessel. Scattered vascular calcifications noted within the carotid siphons. Skull: Scalp soft tissues demonstrate no acute finding. Calvarium intact. Sinuses/Orbits: Globes orbital soft tissues within normal limits. Paranasal sinuses are largely clear. No mastoid effusion. Other: None. ASPECTS [facility] Stroke Program Early CT Score) - Ganglionic level infarction (caudate, lentiform nuclei, internal capsule, insula, M1-M3 cortex): 7 - Supraganglionic infarction (M4-M6 cortex): 3 Total score (0-10 with  10 being normal): 10 IMPRESSION: 1. Negative head CT.  No acute intracranial abnormality. 2. ASPECTS is 10. These results were communicated to Dr. Arora at 3:57 am on 03/22/2024 by text page via the [facility] messaging system. Electronically Signed   By: Morene Hoard M.D.   On: 03/22/2024 03:59        Scheduled Meds:  aspirin   300 mg Rectal Daily   Or   aspirin   325 mg Oral Daily   atorvastatin   80 mg Oral QPM   clopidogrel   75 mg Oral Daily   enoxaparin  (LOVENOX ) injection  40 mg Subcutaneous Daily   pantoprazole  40 mg Oral Daily   Continuous Infusions:   LOS: 1 day      Brayton Lye, MD [facility]   To contact the attending provider between 7A-7P or the covering provider during after hours 7P-7A, please log into the web site [email or web address] and access using universal [place] password for that web site. If you do not have the password, please call the hospital operator.  03/23/2024, 11:41 AM

## 2024-03-24 DIAGNOSIS — E1151 Type 2 diabetes mellitus with diabetic peripheral angiopathy without gangrene: Secondary | ICD-10-CM

## 2024-03-24 DIAGNOSIS — I639 Cerebral infarction, unspecified: Secondary | ICD-10-CM | POA: Diagnosis not present

## 2024-03-24 DIAGNOSIS — I6381 Other cerebral infarction due to occlusion or stenosis of small artery: Secondary | ICD-10-CM | POA: Diagnosis not present

## 2024-03-24 DIAGNOSIS — E785 Hyperlipidemia, unspecified: Secondary | ICD-10-CM | POA: Diagnosis not present

## 2024-03-24 DIAGNOSIS — F17210 Nicotine dependence, cigarettes, uncomplicated: Secondary | ICD-10-CM | POA: Diagnosis not present

## 2024-03-24 DIAGNOSIS — Z7901 Long term (current) use of anticoagulants: Secondary | ICD-10-CM

## 2024-03-24 DIAGNOSIS — Z7982 Long term (current) use of aspirin: Secondary | ICD-10-CM

## 2024-03-24 LAB — CBC
HCT: 43.5 % (ref 36.0–46.0)
Hemoglobin: 13.8 g/dL (ref 12.0–15.0)
MCH: 26.6 pg (ref 26.0–34.0)
MCHC: 31.7 g/dL (ref 30.0–36.0)
MCV: 83.8 fL (ref 80.0–100.0)
Platelets: 226 K/uL (ref 150–400)
RBC: 5.19 MIL/uL — ABNORMAL HIGH (ref 3.87–5.11)
RDW: 14.4 % (ref 11.5–15.5)
WBC: 12 K/uL — ABNORMAL HIGH (ref 4.0–10.5)
nRBC: 0 % (ref 0.0–0.2)

## 2024-03-24 LAB — BASIC METABOLIC PANEL WITH GFR
Anion gap: 11 (ref 5–15)
BUN: 14 mg/dL (ref 8–23)
CO2: 22 mmol/L (ref 22–32)
Calcium: 9 mg/dL (ref 8.9–10.3)
Chloride: 106 mmol/L (ref 98–111)
Creatinine, Ser: 0.76 mg/dL (ref 0.44–1.00)
GFR, Estimated: >60 mL/min (ref >60)
Glucose, Bld: 137 mg/dL — ABNORMAL HIGH (ref 70–99)
Potassium: 4.2 mmol/L (ref 3.5–5.1)
Sodium: 139 mmol/L (ref 135–145)

## 2024-03-24 NOTE — Plan of Care (Signed)
  Problem: Coping: Goal: Ability to adjust to condition or change in health will improve Outcome: Progressing   Problem: Education: Goal: Knowledge of disease or condition will improve Outcome: Progressing Goal: Knowledge of secondary prevention will improve (MUST DOCUMENT ALL) Outcome: Progressing   Problem: Ischemic Stroke/TIA Tissue Perfusion: Goal: Complications of ischemic stroke/TIA will be minimized Outcome: Progressing   Problem: Coping: Goal: Will verbalize positive feelings about self Outcome: Progressing   Problem: Clinical Measurements: Goal: Ability to maintain clinical measurements within normal limits will improve Outcome: Progressing Goal: Will remain free from infection Outcome: Progressing   Problem: Activity: Goal: Risk for activity intolerance will decrease Outcome: Progressing   Problem: Coping: Goal: Level of anxiety will decrease Outcome: Progressing   Problem: Safety: Goal: Ability to remain free from injury will improve Outcome: Progressing

## 2024-03-24 NOTE — Plan of Care (Signed)
   Problem: Education: Goal: Ability to describe self-care measures that may prevent or decrease complications (Diabetes Survival Skills Education) will improve Outcome: Progressing

## 2024-03-24 NOTE — Progress Notes (Signed)
 Inpatient Rehab Admissions:  Inpatient Rehab Consult received.  I met with patient and significant other [name] at the bedside for rehabilitation assessment and to discuss goals and expectations of an inpatient rehab admission.  Discussed average length of stay, insurance authorization requirement and discharge home after completion of CIR. Both acknowledged understanding. Pt is interested in pursuing CIR. [name] is supportive. [name] confirmed that he will be able to provide 24/7 support short-term after pt is discharged. Will discuss dispo with admissions team. Will continue to follow.  Signed: Tinnie Yvone Cohens, MS, CCC-SLP Admissions Coordinator [phone]

## 2024-03-24 NOTE — Inpatient Diabetes Management (Signed)
 Inpatient Diabetes Program Recommendations  AACE/ADA: New Consensus Statement on Inpatient Glycemic Control (2015)  Target Ranges:  Prepandial:   less than 140 mg/dL      Peak postprandial:   less than 180 mg/dL (1-2 hours)      Critically ill patients:  140 - 180 mg/dL   Lab Results  Component Value Date   GLUCAP 108 (H) 03/22/2024   HGBA1C 6.9 (H) 03/22/2024    Review of Glycemic Control  Latest Reference Range & Units 03/22/24 03:36 03/22/24 07:12  Glucose-Capillary 70 - 99 mg/dL 876 (H) 891 (H)   Diabetes history: None/New Diagnosis this admission  Current orders for Inpatient glycemic control:  None A1c 6.9% on 11/28  Note pt with consult for new diabetes diagnosis. Diabetes Coordinator will follow up with pt on 12/1. Diabetes Coordinator works remotely over the weekends but is available via page and phone! Will continue to follow patient.  Thanks,  Clotilda Bull RN, MSN, BC-ADM Inpatient Diabetes Coordinator Team Pager [phone] (8a-5p)

## 2024-03-24 NOTE — Progress Notes (Signed)
 PROGRESS NOTE    Natasha Roach  Natasha Roach DOB: [birth year] DOA: 03/22/2024 PCP: Catalina Bare, MD   Chief Complaint  Patient presents with   Code Stroke    Brief Narrative:   Natasha Roach is a 63 y.o. 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  Prior to admission patient was on Plavix , baby aspirin  and high-dose Lipitor .  Patient presents to ED due to slurred speech, right-sided weakness, MRI brain significant for acute CVA, urine drug screen positive for cocaine , she is admitted for further workup.    Assessment & Plan:   Principal Problem:   Acute CVA (cerebrovascular accident) (HCC)   Acute CVA -MRI brain significant for acute CVA -CTA head and neck significant for stenosis and M1 segment of left MCA, no large vessel occlusion, but atherosclerosis calcification in aortic arch, carotid bulbs . - 2D echo is unremarkable -A1c 6.9 -LDL is 49 -Monitor on telemetry,No significant arrhythmias or only couple events of bigeminy -Allow for permissive hypertension -PT/OT/SLP consulted, recommendation for CIR -Currently on aspirin  and Plavix , will need follow-up with stroke clinic NP at [facility] 4 weeks from discharge  History of CAD/prior MI with stent Ischemic cardiomyopathy with recovered EF History of ventricular tachycardia post MI associated with prolonged QTc Currently asymptomatic Notable for bradycardia therefore not on beta-blocker Continue aspirin  and Plavix  DC within normal limit   Cocaine  use Tobacco abuse -Counseled   Hypertension Not on medications prior to admission Permissive hypertension as above   Dyslipidemia -LDL is 49 on Lipitor  80 mg daily prior to admission, will continue    Diabetes mellitus -A1c 6.9, which is diagnostic of DM, continue with carb modified diet       DVT prophylaxis: (Lovenox ) Code Status: (Full code) Family Communication: (None at bedside Disposition: Pending CIR approval  Status is: Inpatient    Consultants:  Neurology  Subjective:  No significant change in her right-sided weakness, otherwise she denies any other complaints  Objective: Vitals:   03/24/24 0325 03/24/24 0500 03/24/24 0759 03/24/24 1207  BP: 135/89  (!) 158/89 135/80  Pulse: 60  (!) 56 (!) 57  Resp: 20  19 16   Temp: 98.2 F (36.8 C)  (!) 97.3 F (36.3 C) 98 F (36.7 C)  TempSrc: Oral  Axillary Oral  SpO2: 93%  96% 93%  Weight:  78.3 kg    Height:        Intake/Output Summary (Last 24 hours) at 03/24/2024 1354 Last data filed at 03/24/2024 0522 Gross per 24 hour  Intake --  Output 1100 ml  Net -1100 ml   Filed Weights   03/22/24 0404 03/24/24 0500  Weight: 77.8 kg 78.3 kg    Examination:  Awake Alert, Oriented X 3, with significant right-sided weakness Symmetrical Chest wall movement, Good air movement bilaterally, CTAB RRR,No Gallops,Rubs or new Murmurs, No Parasternal Heave +ve B.Sounds, Abd Soft, No tenderness, No rebound - guarding or rigidity. No Cyanosis, Clubbing or edema, No new Rash or bruise       Data Reviewed: I have personally reviewed following labs and imaging studies  CBC: Recent Labs  Lab 03/22/24 0339 03/22/24 0342 03/24/24 0347  WBC 11.5*  --  12.0*  NEUTROABS 6.6  --   --   HGB 13.3 14.3 13.8  HCT  43.7 42.0 43.5  MCV 86.2  --  83.8  PLT 234  --  226    Basic Metabolic Panel: Recent Labs  Lab 03/22/24 0339 03/22/24 0342 03/24/24 0347  NA 143 145 139  K 4.1 4.1 4.2  CL 111 110 106  CO2 22  --  22  GLUCOSE 122* 117* 137*  BUN 16 18 14   CREATININE 0.69 0.70 0.76  CALCIUM  8.8*  --  9.0  MG 2.1  --   --     GFR: Estimated Creatinine Clearance: 78.6 mL/min (by C-G formula based on SCr of 0.76  mg/dL).  Liver Function Tests: Recent Labs  Lab 03/22/24 0339  AST 19  ALT 19  ALKPHOS 99  BILITOT 0.4  PROT 5.7*  ALBUMIN 3.5    CBG: Recent Labs  Lab 03/22/24 0336 03/22/24 0712  GLUCAP 123* 108*     No results found for this or any previous visit (from the past 240 hours).       Radiology Studies: ECHOCARDIOGRAM COMPLETE Result Date: 03/23/2024    ECHOCARDIOGRAM REPORT   Patient Name:   RONAL DELENA FREDRICKSON Date of Exam: 03/23/2024 Medical Rec #:  [id number]              Height:       67.0 in Accession #:    [id number]             Weight:       171.5 lb Date of Birth:  08-17-1960              BSA:          1.894 m Patient Age:    63 years               BP:           157/80 mmHg Patient Gender: F                      HR:           56 bpm. Exam Location:  Inpatient Procedure: 2D Echo, Cardiac Doppler and Color Doppler (Both Spectral and Color            Flow Doppler were utilized during procedure). Indications:    Stroke I63.9  History:        Patient has prior history of Echocardiogram examinations, most                 recent 01/16/2018. Cardiomyopathy, Previous Myocardial                 Infarction, PSA and Stroke; Risk Factors:Current Smoker,                 Hypertension and Dyslipidemia.  Sonographer:    Koleen Popper RDCS Referring Phys: [id number] ALLISON L ELLIS IMPRESSIONS  1. Left ventricular ejection fraction, by estimation, is 60 to 65%. The left ventricle has normal function. The left ventricle has no regional wall motion abnormalities. Left ventricular diastolic parameters were normal.  2. Right ventricular systolic function is normal. The right ventricular size is normal.  3. The mitral valve is normal in structure. Trivial mitral valve regurgitation. No evidence of mitral stenosis.  4. The aortic valve is normal in structure. Aortic valve regurgitation is not visualized. No aortic stenosis is present.  5. The inferior vena cava is normal in size with greater than 50%  respiratory variability, suggesting right atrial pressure of 3 mmHg. Conclusion(s)/Recommendation(s): No intracardiac source  of embolism detected on this transthoracic study. Consider a transesophageal echocardiogram to exclude cardiac source of embolism if clinically indicated. FINDINGS  Left Ventricle: Left ventricular ejection fraction, by estimation, is 60 to 65%. The left ventricle has normal function. The left ventricle has no regional wall motion abnormalities. The left ventricular internal cavity size was normal in size. There is  no left ventricular hypertrophy. Left ventricular diastolic parameters were normal. Normal left ventricular filling pressure. Right Ventricle: The right ventricular size is normal. No increase in right ventricular wall thickness. Right ventricular systolic function is normal. Left Atrium: Left atrial size was normal in size. Right Atrium: Right atrial size was normal in size. Pericardium: There is no evidence of pericardial effusion. Mitral Valve: The mitral valve is normal in structure. Trivial mitral valve regurgitation. No evidence of mitral valve stenosis. Tricuspid Valve: The tricuspid valve is normal in structure. Tricuspid valve regurgitation is trivial. No evidence of tricuspid stenosis. Aortic Valve: The aortic valve is normal in structure. Aortic valve regurgitation is not visualized. No aortic stenosis is present. Pulmonic Valve: The pulmonic valve was normal in structure. Pulmonic valve regurgitation is not visualized. No evidence of pulmonic stenosis. Aorta: The aortic root is normal in size and structure. Venous: The inferior vena cava is normal in size with greater than 50% respiratory variability, suggesting right atrial pressure of 3 mmHg. IAS/Shunts: No atrial level shunt detected by color flow Doppler.  LEFT VENTRICLE PLAX 2D LVIDd:         3.90 cm      Diastology LVIDs:         2.40 cm      LV e' medial:    7.40 cm/s LV PW:         0.90 cm      LV E/e' medial:   9.6 LV IVS:        1.10 cm      LV e' lateral:   12.50 cm/s LVOT diam:     1.90 cm      LV E/e' lateral: 5.7 LV SV:         56 LV SV Index:   30 LVOT Area:     2.84 cm  LV Volumes (MOD) LV vol d, MOD A2C: 133.0 ml LV vol s, MOD A2C: 40.8 ml LV SV MOD A2C:     92.2 ml RIGHT VENTRICLE             IVC RV Basal diam:  3.80 cm     IVC diam: 1.50 cm RV S prime:     18.10 cm/s TAPSE (M-mode): 2.7 cm LEFT ATRIUM             Index        RIGHT ATRIUM           Index LA diam:        2.80 cm 1.48 cm/m   RA Area:     12.40 cm LA Vol (A2C):   35.6 ml 18.79 ml/m  RA Volume:   27.70 ml  14.62 ml/m LA Vol (A4C):   50.5 ml 26.66 ml/m LA Biplane Vol: 42.1 ml 22.22 ml/m  AORTIC VALVE LVOT Vmax:   110.00 cm/s LVOT Vmean:  66.000 cm/s LVOT VTI:    0.199 m  AORTA Ao Root diam: 3.00 cm MITRAL VALVE MV Area (PHT): 2.69 cm    SHUNTS MV Decel Time: 282 msec    Systemic VTI:  0.20 m MV E velocity: 70.70 cm/s  Systemic  Diam: 1.90 cm MV A velocity: 72.80 cm/s MV E/A ratio:  0.97 Wilbert Bihari MD Electronically signed by Wilbert Bihari MD Signature Date/Time: 03/23/2024/2:51:13 PM    Final         Scheduled Meds:  aspirin   300 mg Rectal Daily   Or   aspirin   325 mg Oral Daily   atorvastatin   80 mg Oral QPM   clopidogrel   75 mg Oral Daily   enoxaparin  (LOVENOX ) injection  40 mg Subcutaneous Daily   pantoprazole  40 mg Oral Daily   Continuous Infusions:   LOS: 2 days      Brayton Lye, MD [facility]   To contact the attending provider between 7A-7P or the covering provider during after hours 7P-7A, please log into the web site [email or web address] and access using universal [place] password for that web site. If you do not have the password, please call the hospital operator.  03/24/2024, 1:54 PM

## 2024-03-24 NOTE — Progress Notes (Signed)
 STROKE TEAM PROGRESS NOTE   SUBJECTIVE (INTERVAL HISTORY) No family is at the bedside. Pt sitting in chair, still has R hemiparesis. Echo unremarkable. Pending CIR   OBJECTIVE Temp:  [97.3 F (36.3 C)-98.6 F (37 C)] 97.3 F (36.3 C) (11/30 0759) Pulse Rate:  [51-67] 56 (11/30 0759) Cardiac Rhythm: Sinus bradycardia;Normal sinus rhythm (11/30 0700) Resp:  [18-20] 19 (11/30 0759) BP: (127-169)/(74-96) 158/89 (11/30 0759) SpO2:  [93 %-99 %] 96 % (11/30 0759) Weight:  [78.3 kg] 78.3 kg (11/30 0500)  Recent Labs  Lab 03/22/24 0336 03/22/24 0712  GLUCAP 123* 108*   Recent Labs  Lab 03/22/24 0339 03/22/24 0342 03/24/24 0347  NA 143 145 139  K 4.1 4.1 4.2  CL 111 110 106  CO2 22  --  22  GLUCOSE 122* 117* 137*  BUN 16 18 14   CREATININE 0.69 0.70 0.76  CALCIUM  8.8*  --  9.0  MG 2.1  --   --    Recent Labs  Lab 03/22/24 0339  AST 19  ALT 19  ALKPHOS 99  BILITOT 0.4  PROT 5.7*  ALBUMIN 3.5   Recent Labs  Lab 03/22/24 0339 03/22/24 0342 03/24/24 0347  WBC 11.5*  --  12.0*  NEUTROABS 6.6  --   --   HGB 13.3 14.3 13.8  HCT 43.7 42.0 43.5  MCV 86.2  --  83.8  PLT 234  --  226   No results for input(s): CKTOTAL, CKMB, CKMBINDEX, TROPONINI in the last 168 hours. Recent Labs    03/22/24 0339  LABPROT 13.1  INR 0.9   No results for input(s): COLORURINE, LABSPEC, PHURINE, GLUCOSEU, HGBUR, BILIRUBINUR, KETONESUR, PROTEINUR, UROBILINOGEN, NITRITE, LEUKOCYTESUR in the last 72 hours.  Invalid input(s): APPERANCEUR     Component Value Date/Time   CHOL 109 03/23/2024 0551   CHOL 135 06/18/2018 1144   TRIG 91 03/23/2024 0551   HDL 42 03/23/2024 0551   HDL 49 06/18/2018 1144   CHOLHDL 2.6 03/23/2024 0551   VLDL 18 03/23/2024 0551   LDLCALC 49 03/23/2024 0551   LDLCALC 56 06/18/2018 1144   Lab Results  Component Value Date   HGBA1C 6.9 (H) 03/22/2024      Component Value Date/Time   LABOPIA NONE DETECTED 03/22/2024 0907    COCAINSCRNUR POSITIVE (A) 03/22/2024 0907   COCAINSCRNUR See Final Results 12/31/2015 2009   LABBENZ NONE DETECTED 03/22/2024 0907   AMPHETMU NONE DETECTED 03/22/2024 0907   THCU NONE DETECTED 03/22/2024 0907   LABBARB NONE DETECTED 03/22/2024 0907    Recent Labs  Lab 03/22/24 0342  ETH <15    I have personally reviewed the radiological images below and agree with the radiology interpretations.  ECHOCARDIOGRAM COMPLETE Result Date: 03/23/2024    ECHOCARDIOGRAM REPORT   Patient Name:   Natasha Roach Date of Exam: 03/23/2024 Medical Rec #:  [id number]              Height:       67.0 in Accession #:    [id number]             Weight:       171.5 lb Date of Birth:  [birth year]              BSA:          1.894 m Patient Age:    63 years               BP:  157/80 mmHg Patient Gender: F                      HR:           56 bpm. Exam Location:  Inpatient Procedure: 2D Echo, Cardiac Doppler and Color Doppler (Both Spectral and Color            Flow Doppler were utilized during procedure). Indications:    Stroke I63.9  History:        Patient has prior history of Echocardiogram examinations, most                 recent 01/16/2018. Cardiomyopathy, Previous Myocardial                 Infarction, PSA and Stroke; Risk Factors:Current Smoker,                 Hypertension and Dyslipidemia.  Sonographer:    Koleen Popper RDCS Referring Phys: [id number] ALLISON L ELLIS IMPRESSIONS  1. Left ventricular ejection fraction, by estimation, is 60 to 65%. The left ventricle has normal function. The left ventricle has no regional wall motion abnormalities. Left ventricular diastolic parameters were normal.  2. Right ventricular systolic function is normal. The right ventricular size is normal.  3. The mitral valve is normal in structure. Trivial mitral valve regurgitation. No evidence of mitral stenosis.  4. The aortic valve is normal in structure. Aortic valve regurgitation is not visualized. No aortic stenosis is  present.  5. The inferior vena cava is normal in size with greater than 50% respiratory variability, suggesting right atrial pressure of 3 mmHg. Conclusion(s)/Recommendation(s): No intracardiac source of embolism detected on this transthoracic study. Consider a transesophageal echocardiogram to exclude cardiac source of embolism if clinically indicated. FINDINGS  Left Ventricle: Left ventricular ejection fraction, by estimation, is 60 to 65%. The left ventricle has normal function. The left ventricle has no regional wall motion abnormalities. The left ventricular internal cavity size was normal in size. There is  no left ventricular hypertrophy. Left ventricular diastolic parameters were normal. Normal left ventricular filling pressure. Right Ventricle: The right ventricular size is normal. No increase in right ventricular wall thickness. Right ventricular systolic function is normal. Left Atrium: Left atrial size was normal in size. Right Atrium: Right atrial size was normal in size. Pericardium: There is no evidence of pericardial effusion. Mitral Valve: The mitral valve is normal in structure. Trivial mitral valve regurgitation. No evidence of mitral valve stenosis. Tricuspid Valve: The tricuspid valve is normal in structure. Tricuspid valve regurgitation is trivial. No evidence of tricuspid stenosis. Aortic Valve: The aortic valve is normal in structure. Aortic valve regurgitation is not visualized. No aortic stenosis is present. Pulmonic Valve: The pulmonic valve was normal in structure. Pulmonic valve regurgitation is not visualized. No evidence of pulmonic stenosis. Aorta: The aortic root is normal in size and structure. Venous: The inferior vena cava is normal in size with greater than 50% respiratory variability, suggesting right atrial pressure of 3 mmHg. IAS/Shunts: No atrial level shunt detected by color flow Doppler.  LEFT VENTRICLE PLAX 2D LVIDd:         3.90 cm      Diastology LVIDs:         2.40 cm       LV e' medial:    7.40 cm/s LV PW:         0.90 cm      LV E/e' medial:  9.6 LV  IVS:        1.10 cm      LV e' lateral:   12.50 cm/s LVOT diam:     1.90 cm      LV E/e' lateral: 5.7 LV SV:         56 LV SV Index:   30 LVOT Area:     2.84 cm  LV Volumes (MOD) LV vol d, MOD A2C: 133.0 ml LV vol s, MOD A2C: 40.8 ml LV SV MOD A2C:     92.2 ml RIGHT VENTRICLE             IVC RV Basal diam:  3.80 cm     IVC diam: 1.50 cm RV S prime:     18.10 cm/s TAPSE (M-mode): 2.7 cm LEFT ATRIUM             Index        RIGHT ATRIUM           Index LA diam:        2.80 cm 1.48 cm/m   RA Area:     12.40 cm LA Vol (A2C):   35.6 ml 18.79 ml/m  RA Volume:   27.70 ml  14.62 ml/m LA Vol (A4C):   50.5 ml 26.66 ml/m LA Biplane Vol: 42.1 ml 22.22 ml/m  AORTIC VALVE LVOT Vmax:   110.00 cm/s LVOT Vmean:  66.000 cm/s LVOT VTI:    0.199 m  AORTA Ao Root diam: 3.00 cm MITRAL VALVE MV Area (PHT): 2.69 cm    SHUNTS MV Decel Time: 282 msec    Systemic VTI:  0.20 m MV E velocity: 70.70 cm/s  Systemic Diam: 1.90 cm MV A velocity: 72.80 cm/s MV E/A ratio:  0.97 Wilbert Bihari MD Electronically signed by Wilbert Bihari MD Signature Date/Time: 03/23/2024/2:51:13 PM    Final    MR BRAIN WO CONTRAST Result Date: 03/22/2024 EXAM: MRI BRAIN WITHOUT CONTRAST 03/22/2024 08:19:37 AM TECHNIQUE: Multiplanar multisequence MRI of the head/brain was performed without the administration of intravenous contrast. COMPARISON: CT of the head dated 03/22/2024. CLINICAL HISTORY: Neuro deficit, acute, stroke suspected. FINDINGS: BRAIN AND VENTRICLES: There is restricted diffusion present within the left periventricular white matter extending to the posterior limb of the left internal capsule. It is consistent with an acute small vessel bland infarct. There is age-related atrophy. Moderate cerebral white matter disease. No intracranial hemorrhage. No mass. No midline shift. No hydrocephalus. The sella is unremarkable. Normal flow voids. ORBITS: No acute abnormality.  SINUSES AND MASTOIDS: No acute abnormality. BONES AND SOFT TISSUES: Normal marrow signal. No acute soft tissue abnormality. IMPRESSION: 1. Acute small vessel bland infarct in the left periventricular white matter extending to the posterior limb of the left internal capsule. 2. Moderate cerebral white matter disease. 3. Age-related atrophy. Electronically signed by: Evalene Coho MD 03/22/2024 08:51 AM EST RP Workstation: [id number]   CT ANGIO HEAD NECK W WO CM (CODE STROKE) Result Date: 03/22/2024 EXAM: CTA HEAD AND NECK WITHOUT AND WITH 03/22/2024 04:02:27 AM TECHNIQUE: CTA of the head and neck was performed without and with the administration of 75 mL of iohexol  (OMNIPAQUE ) 350 MG/ML injection. Multiplanar 2D and/or 3D reformatted images are provided for review. Automated exposure control, iterative reconstruction, and/or weight based adjustment of the mA/kV was utilized to reduce the radiation dose to as low as reasonably achievable. Stenosis of the internal carotid arteries measured using NASCET criteria. COMPARISON: CT of the head dated 03/22/2024 CLINICAL HISTORY: Stroke, follow up FINDINGS: CTA NECK:  AORTIC ARCH AND ARCH VESSELS: There is mild calcific atheromatous disease within the aortic arch and origin of the left subclavian artery. There is common origin of the brachiocephalic artery and left common carotid artery. No dissection or arterial injury. No significant stenosis of the brachiocephalic or subclavian arteries. CERVICAL CAROTID ARTERIES: There is mild calcific plaque present within the carotid bulbs, with no associated stenosis. The cervical segments of the internal carotid arteries are tortuous, but normal in caliber. No dissection, arterial injury, or hemodynamically significant stenosis by NASCET criteria. CERVICAL VERTEBRAL ARTERIES: The vertebral arteries are codominant and normal in caliber throughout their respective courses. No dissection, arterial injury, or significant stenosis.  LUNGS AND MEDIASTINUM: Unremarkable. SOFT TISSUES: No acute abnormality. BONES: No acute abnormality. CTA HEAD: ANTERIOR CIRCULATION: There is mild calcific atheromatous disease within the carotid siphons. There is no flow-limiting stenosis. There is mild stenosis within the distal M1 segment of the left middle cerebral artery. No significant stenosis of the anterior cerebral arteries. No aneurysm. POSTERIOR CIRCULATION: There is fetal type origin of the posterior cerebral arteries with a diminutive basilar artery and diminutive P1 segments. No significant stenosis of the posterior cerebral arteries. No significant stenosis of the basilar artery. No significant stenosis of the vertebral arteries. No aneurysm. OTHER: No dural venous sinus thrombosis on this non-dedicated study. No large vessel occlusion or flow-limiting stenosis. The above findings were communicated to Dr. Voncile at 4:14 am 03/22/2024. IMPRESSION: 1. Mild stenosis within the distal M1 segment of the left middle cerebral artery; no large vessel occlusion, hemodynamically significant stenosis, or aneurysm in the head or neck 2. Atherosclerotic calcifications involving the aortic arch, carotid bulbs, and carotid siphons without flow-limiting stenosis; tortuous but normal-caliber cervical internal carotid arteries; codominant vertebral arteries 3. Anatomic variants including common origin of the brachiocephalic and left common carotid arteries and fetal-type origin of the posterior cerebral arteries with diminutive basilar artery and P1 segments 4. Findings communicated to Dr. Arora at 4:14 am on 03/22/2024 Electronically signed by: Evalene Coho MD 03/22/2024 04:15 AM EST RP Workstation: [id number]   CT HEAD CODE STROKE WO CONTRAST (LKW 0-4.5h, LVO 0-24h) Result Date: 03/22/2024 CLINICAL DATA:  Code stroke. Initial evaluation for acute neuro deficit, stroke suspected, right-sided weakness. EXAM: CT HEAD WITHOUT CONTRAST TECHNIQUE: Contiguous axial  images were obtained from the base of the skull through the vertex without intravenous contrast. RADIATION DOSE REDUCTION: This exam was performed according to the departmental dose-optimization program which includes automated exposure control, adjustment of the mA and/or kV according to patient size and/or use of iterative reconstruction technique. COMPARISON:  CT from 04/20/2007. FINDINGS: Brain: Cerebral volume within normal limits. No acute intracranial hemorrhage. No acute large vessel territory infarct. No mass lesion, midline shift or mass effect. No hydrocephalus or extra-axial fluid collection. Vascular: No abnormal hyperdense vessel. Scattered vascular calcifications noted within the carotid siphons. Skull: Scalp soft tissues demonstrate no acute finding. Calvarium intact. Sinuses/Orbits: Globes orbital soft tissues within normal limits. Paranasal sinuses are largely clear. No mastoid effusion. Other: None. ASPECTS [facility] Stroke Program Early CT Score) - Ganglionic level infarction (caudate, lentiform nuclei, internal capsule, insula, M1-M3 cortex): 7 - Supraganglionic infarction (M4-M6 cortex): 3 Total score (0-10 with 10 being normal): 10 IMPRESSION: 1. Negative head CT.  No acute intracranial abnormality. 2. ASPECTS is 10. These results were communicated to Dr. Arora at 3:57 am on 03/22/2024 by text page via the [facility] messaging system. Electronically Signed   By: Morene Hoard M.D.   On: 03/22/2024 03:59  PHYSICAL EXAM  Temp:  [97.3 F (36.3 C)-98.6 F (37 C)] 97.3 F (36.3 C) (11/30 0759) Pulse Rate:  [51-67] 56 (11/30 0759) Resp:  [18-20] 19 (11/30 0759) BP: (127-169)/(74-96) 158/89 (11/30 0759) SpO2:  [93 %-99 %] 96 % (11/30 0759) Weight:  [78.3 kg] 78.3 kg (11/30 0500)  General - Well nourished, well developed, in no apparent distress.  Ophthalmologic - fundi not visualized due to noncooperation.  Cardiovascular - Regular rhythm and rate.  Neuro - awake, alert,  eyes open, orientated to age, place, time. No aphasia, fluent language, following all simple commands. Able to name and repeat. No gaze palsy, tracking bilaterally, visual field full. R facial droop. Tongue midline. LUE and LLE 5/5, RUE bicep 2/5, tricep 0/5, finger movement 0/5. LLE proximal 3/5, distally 0/5 today. Sensation symmetrical bilaterally, L FTN intact, gait not tested.     ASSESSMENT/PLAN Ms. Natasha Roach is a 63 y.o. female with history of hyperlipidemia, CAD, smoker, cocaine  user admitted for right-sided weakness, slurred speech. No TNK given due to outside window.    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  Patient counseled to be compliant with her antithrombotic medications Ongoing aggressive stroke risk factor management Therapy recommendations:  CIR Disposition:  pending  Diabetes, new diagnosis HgbA1c 6.9 goal < 7.0 Controlled CBG monitoring SSI Diabetic coordinator consulted for DM education DM education and close PCP follow up  BP management Stable Long term BP goal normotensive  Hyperlipidemia Home meds: Lipitor  80 LDL 49, goal < 70 Now on home lipitor  80 Continue statin at discharge  Tobacco abuse Current smoker Smoking cessation counseling provided Nicotine patch provided Pt is willing to quit  Cocaine  abuse Patient admitted cocaine  use UDS positive for Cocaine  Cessation education provided  Other Stroke Risk Factors Coronary artery disease  Other Active Problems Leukocytosis WBC 11.5--12.0  Hospital day # 2  Neurology will sign  off. Please call with questions. Pt will follow up with stroke clinic NP at [facility] in about 4 weeks. Thanks for the consult.   Ary Cummins, MD PhD Stroke Neurology 03/24/2024 10:43 AM    To contact Stroke Continuity provider, please refer to [email or web address]. After hours, contact General Neurology

## 2024-03-24 NOTE — PMR Pre-admission (Shared)
 PMR Admission Coordinator Pre-Admission Assessment  Patient: Natasha Roach is an 63 y.o., female MRN: [id number] DOB: [birth year] Height: 5' 7 (170.2 cm) Weight: 78.3 kg  Insurance Information HMO: ***    PPO: ***     PCP:      IPA:      80/20:      OTHER:  PRIMARY: [place] [employer]      Policy#: [id number]      Subscriber: patient CM Name: ***      Phone#: ***     Fax#: *** Pre-Cert#: ***      Employer: *** Benefits:  Phone #: ***     Name: *** Eff. Date: ***     Deduct: ***      Out of Pocket Max: ***      Life Max: *** CIR: ***      SNF: *** Outpatient: ***     Co-Pay: *** Home Health: ***      Co-Pay: *** DME: ***     Co-Pay: *** Providers: in-network SECONDARY:       Policy#:      Phone#:   Financial Counselor:       Phone#:   The Data Processing Manager" for patients in Inpatient Rehabilitation Facilities with attached "Privacy Act Statement-Health Care Records" was provided and verbally reviewed with: Patient  Emergency Contact Information Contact Information     Name Relation Home Work Mobile   Hendrix,[name] Significant other   [phone]      Other Contacts   None on File     Current Medical History  Patient Admitting Diagnosis: CVA History of Present Illness: Pt is a 63 year old female with medical hx significant for: HTN, hyperlipidemia, CAD with STEMI in 2017 s/p LAD stent, cocaine -induced MI, cocaine  abuse, tobacco abuse. Pt presented to [facility] on 03/22/24 d/t sudden onset of right-sided weakness and slurred speech. Code stroke activated by EMS. CT negative for acute abnormalities. CTA head/neck negative for LVO. Not a TNK candidate. MRI revealed left BG/RG infarct secondary to small vessel disease. Therapy evaluations completed and CIR recommended d/t pt's deficits in functional mobility. Complete NIHSS TOTAL: 6  Patient's medical record from [facility] has been reviewed by the rehabilitation admission  coordinator and physician.  Past Medical History  Past Medical History:  Diagnosis Date   CAD (coronary artery disease) 04/23/2016   a. 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 b. STEMI 03/2016 due to cocaine , occ mLAD s/p Synergy stent, EF 40% by cath, 55% by echo.   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 at time of STEMI 03/2016.    Has the patient had major surgery during 100 days prior to admission? No  Family History   family history includes CAD in her father.  Current Medications  Current Facility-Administered Medications:    acetaminophen  (TYLENOL ) tablet 650 mg, 650 mg, Oral, Q6H PRN **OR** acetaminophen  (TYLENOL ) suppository 650 mg, 650 mg, Rectal, Q6H PRN, Howerter, Justin B, DO   aspirin  suppository 300 mg, 300 mg, Rectal, Daily **OR** aspirin  tablet  325 mg, 325 mg, Oral, Daily, Alto Isaiah CROME, NP, 325 mg at 03/24/24 9066   atorvastatin  (LIPITOR ) tablet 80 mg, 80 mg, Oral, QPM, Alto Isaiah CROME, NP, 80 mg at 03/23/24 1734   clopidogrel  (PLAVIX ) tablet 75 mg, 75 mg, Oral, Daily, Alto Isaiah CROME, NP, 75 mg at 03/24/24 0933   enoxaparin  (LOVENOX ) injection 40 mg, 40 mg, Subcutaneous, Daily, Alto Isaiah CROME, NP, 40 mg at 03/24/24 0933   ipratropium-albuterol (DUONEB) 0.5-2.5 (3) MG/3ML nebulizer solution 3 mL, 3 mL, Nebulization, Q6H PRN, Alto Isaiah CROME, NP   LORazepam  (ATIVAN ) tablet 0.5 mg, 0.5 mg, Oral, Q6H PRN, Alto Isaiah CROME, NP, 0.5 mg at 03/23/24 1734   melatonin tablet 3 mg, 3 mg, Oral, QHS PRN, Howerter, Justin B, DO   nitroGLYCERIN  (NITROSTAT ) SL tablet 0.4 mg, 0.4 mg, Sublingual, Q5 Min x 3 PRN,  Alto Isaiah CROME, NP   ondansetron  (ZOFRAN ) injection 4 mg, 4 mg, Intravenous, Q6H PRN, Howerter, Justin B, DO   pantoprazole (PROTONIX) EC tablet 40 mg, 40 mg, Oral, Daily, Elgergawy, Dawood S, MD, 40 mg at 03/24/24 9066  Patients Current Diet:  Diet Order             Diet Heart Room service appropriate? Yes with Assist; Fluid consistency: Thin  Diet effective now                   Precautions / Restrictions Precautions Precautions: Fall Restrictions Weight Bearing Restrictions Per Provider Order: No   Has the patient had 2 or more falls or a fall with injury in the past year? No  Prior Activity Level Community (5-7x/wk): gets out of house often, drives  Prior Functional Level Self Care: Did the patient need help bathing, dressing, using the toilet or eating? Independent  Indoor Mobility: Did the patient need assistance with walking from room to room (with or without device)? Independent  Stairs: Did the patient need assistance with internal or external stairs (with or without device)? Independent  Functional Cognition: Did the patient need help planning regular tasks such as shopping or remembering to take medications? Independent  Patient Information Are you of Hispanic, Latino/a,or Spanish origin?: A. No, not of Hispanic, Latino/a, or Spanish origin What is your race?: B. Black or African American Do you need or want an interpreter to communicate with a doctor or health care staff?: 0. No  Patient's Response To:  Health Literacy and Transportation Is the patient able to respond to health literacy and transportation needs?: Yes Health Literacy - How often do you need to have someone help you when you read instructions, pamphlets, or other written material from your doctor or pharmacy?: Never In the past 12 months, has lack of transportation kept you from medical appointments or from getting medications?: No In the past 12 months, has lack of transportation kept you  from meetings, work, or from getting things needed for daily living?: No  Home Assistive Devices / Equipment Home Equipment: None  Prior Device Use: Indicate devices/aids used by the patient prior to current illness, exacerbation or injury? None of the above  Current Functional Level Cognition  Orientation Level: Oriented X4    Extremity Assessment (includes Sensation/Coordination)  Upper Extremity Assessment: Right hand dominant, RUE deficits/detail RUE Deficits / Details: trace movement shoulder and elbow; flaccid distally (full PROM) RUE Coordination: decreased gross motor       ADLs  Overall ADL's : Needs assistance/impaired Eating/Feeding: Set up, Bed level Grooming: Moderate assistance, Bed level Upper Body  Bathing: Moderate assistance, Bed level Lower Body Bathing: Total assistance, Bed level Upper Body Dressing : Maximal assistance, Bed level Lower Body Dressing: Total assistance, Bed level Toilet Transfer Details (indicate cue type and reason): Attmeptd to take step with LLE and right knee buckled    Mobility  Overal bed mobility: Needs Assistance Bed Mobility: Supine to Sit Rolling: Min assist Sidelying to sit: Min assist Supine to sit: Min assist General bed mobility comments: Increased time and min A for trunk elevation    Transfers  Overall transfer level: Needs assistance Equipment used: Rolling walker (2 wheels) Transfers: Sit to/from Stand, Bed to chair/wheelchair/BSC Sit to Stand: Min assist Bed to/from chair/wheelchair/BSC transfer type:: Step pivot Squat pivot transfers: Min assist, Mod assist Step pivot transfers: Mod assist General transfer comment: STS from EOB and recliner with min A for rise and stability due to slight impulsivity and decreased awareness. R knee blocked throughout due to buckling and hyperextension. assist for balance and RW management. Cues for sequencing.    Ambulation / Gait / Stairs / Wheelchair Mobility   Ambulation/Gait General Gait Details: deferred due to no +2 Pre-gait activities: standing at EOB x2 with PT assisting w/shift, L knee control exercise, unweighting, foot placement    Posture / Balance Dynamic Sitting Balance Sitting balance - Comments: CGA sitting EOB Balance Overall balance assessment: Needs assistance Sitting-balance support: Single extremity supported, Feet supported Sitting balance-Leahy Scale: Fair Sitting balance - Comments: CGA sitting EOB Postural control: Right lateral lean (mild, can self correct with cues) Standing balance support: Single extremity supported, Bilateral upper extremity supported, During functional activity, Reliant on assistive device for balance Standing balance-Leahy Scale: Poor Standing balance comment: reliant on UE and external support    Special considerations/life events  Skin Ecchymosis: hip/right and Bladder incontinence   Previous Home Environment (from acute therapy documentation) Living Arrangements: Spouse/significant other  Lives With: Significant other Available Help at Discharge: Family, Available PRN/intermittently Type of Home: House Home Layout: One level Home Access: Level entry Bathroom Shower/Tub: Engineer, Manufacturing Systems: Standard Bathroom Accessibility: Yes How Accessible: Accessible via walker Home Care Services: No  Discharge Living Setting Does the patient have any problems obtaining your medications?: No  Social/Family/Support Systems    Goals    Decrease burden of Care through IP rehab admission: NA  Possible need for SNF placement upon discharge: Not anticipated  Patient Condition: {PATIENT'S CONDITION:22832}  Preadmission Screen Completed By:  Tinnie SHAUNNA Yvone Delayne, 03/24/2024 3:21 PM ______________________________________________________________________   Discussed status with Dr. PIERRETTE on *** at *** and received approval for admission today.  Admission Coordinator:  Tinnie SHAUNNA Yvone Delayne, CCC-SLP, time ***/Date ***   Assessment/Plan: Diagnosis: *** Does the need for close, 24 hr/day Medical supervision in concert with the patient's rehab needs make it unreasonable for this patient to be served in a less intensive setting? {yes_no_potentially:3041433} Co-Morbidities requiring supervision/potential complications: *** Due to {due un:6958565}, does the patient require 24 hr/day rehab nursing? {yes_no_potentially:3041433} Does the patient require coordinated care of a physician, rehab nurse, PT, OT, and SLP to address physical and functional deficits in the context of the above medical diagnosis(es)? {yes_no_potentially:3041433} Addressing deficits in the following areas: {deficits:3041436} Can the patient actively participate in an intensive therapy program of at least 3 hrs of therapy 5 days a week? {yes_no_potentially:3041433} The potential for patient to make measurable gains while on inpatient rehab is {potential:3041437} Anticipated functional outcomes upon discharge from inpatient rehab: {functional outcomes:304600100} PT, {functional outcomes:304600100} OT, {functional outcomes:304600100} SLP Estimated  rehab length of stay to reach the above functional goals is: *** Anticipated discharge destination: {anticipated dc setting:21604} 10. Overall Rehab/Functional Prognosis: {potential:3041437}   MD Signature: ***

## 2024-03-25 DIAGNOSIS — I639 Cerebral infarction, unspecified: Secondary | ICD-10-CM | POA: Diagnosis not present

## 2024-03-25 MED ORDER — ASPIRIN 81 MG PO TBEC
81.0000 mg | DELAYED_RELEASE_TABLET | Freq: Every day | ORAL | Status: DC
  Administered 2024-03-25 – 2024-03-29 (×5): 81 mg via ORAL
  Filled 2024-03-25 (×5): qty 1

## 2024-03-25 MED ORDER — LIVING WELL WITH DIABETES BOOK
Freq: Once | Status: AC
  Filled 2024-03-25: qty 1

## 2024-03-25 NOTE — Inpatient Diabetes Management (Signed)
 Inpatient Diabetes Program Recommendations  AACE/ADA: New Consensus Statement on Inpatient Glycemic Control (2015)  Target Ranges:  Prepandial:   less than 140 mg/dL      Peak postprandial:   less than 180 mg/dL (1-2 hours)      Critically ill patients:  140 - 180 mg/dL   Lab Results  Component Value Date   GLUCAP 108 (H) 03/22/2024   HGBA1C 6.9 (H) 03/22/2024    Review of Glycemic Control  Latest Reference Range & Units 03/22/24 03:36 03/22/24 07:12  Glucose-Capillary 70 - 99 mg/dL 876 (H) 891 (H)   Diabetes history: Borderline DM Outpatient Diabetes medications: None Current orders for Inpatient glycemic control:  None Inpatient Diabetes Program Recommendations:    Spoke to patient regarding elevated A1C.  Told her that this is in the range of Diabetes.  Patient states that she knew she was borderline, but does not think she has Diabetes.  She states that she occasionally spot checks her blood sugars and they are within normal range.  I explained that elevated blood sugars can increase risk for strokes, MI, and other complications.  Patient admits to eating sweets and drinking sweetened beverages.  Encouraged her to reduce sugar intake especially in beverages.  Patient overwhelmed with current medical issues but states she will try to watch what she eats.  I told her that MD may want to add oral agent for DM , however she states she does not want this.  Ordered LWWD booklet as well.   Thanks,  Randall Bullocks, RN, BC-ADM Inpatient Diabetes Coordinator Pager [phone]  (8a-5p)

## 2024-03-25 NOTE — Plan of Care (Signed)
  Problem: Health Behavior/Discharge Planning: Goal: Ability to manage health-related needs will improve Outcome: Progressing   Problem: Education: Goal: Knowledge of disease or condition will improve Outcome: Progressing Goal: Knowledge of secondary prevention will improve (MUST DOCUMENT ALL) Outcome: Progressing Goal: Knowledge of patient specific risk factors will improve (DELETE if not current risk factor) Outcome: Progressing   Problem: Ischemic Stroke/TIA Tissue Perfusion: Goal: Complications of ischemic stroke/TIA will be minimized Outcome: Progressing   Problem: Coping: Goal: Will verbalize positive feelings about self Outcome: Progressing   Problem: Coping: Goal: Level of anxiety will decrease Outcome: Progressing

## 2024-03-25 NOTE — Progress Notes (Signed)
 Physical Therapy Treatment Patient Details Name: Natasha Roach MRN: [id number] DOB: [birth year] Today's Date: 03/25/2024   History of Present Illness 63 yo female presenting 11/28 with R-sided weakness and slurred speech. (+) cocaine , NIH: 7, MRI showed: acute small vessel bland infarct in the left periventricular white matter extending to the posterior limb of the left internal capsule. Moderate cerebral white matter disease. PMHx: suicidal, HTN, HLD, CAD, cocaine  abuse, tobacco abuse, MI    PT Comments  The pt was eager to participate in session with focus on initiation of gait training. Pt continues to demo poor awareness, initiation, and sequencing of movements in RLE, needing assist to advance RLE due to weakness in R hip flexors and DF, as well as intermittent assist to prevent R knee buckling in stance. Pt dependent on max cues for sequencing all movements, and is able to prevent R knee buckling with cues and increased attention to RLE prior to stance. Pt highly motivated to improve, recommendations for intensive post-acute therapy remain appropriate at this time.    If plan is discharge home, recommend the following: A lot of help with walking and/or transfers;A lot of help with bathing/dressing/bathroom;Assistance with cooking/housework;Help with stairs or ramp for entrance   Can travel by private vehicle        Equipment Recommendations  Wheelchair (measurements PT);Wheelchair cushion (measurements PT);Rolling walker (2 wheels)    Recommendations for Other Services Rehab consult     Precautions / Restrictions Precautions Precautions: Fall Recall of Precautions/Restrictions: Impaired Restrictions Weight Bearing Restrictions Per Provider Order: No     Mobility  Bed Mobility Overal bed mobility: Needs Assistance Bed Mobility: Supine to Sit     Supine to sit: Min assist     General bed mobility comments: Increased time and min A for trunk elevation, mod cues and  assist for RUE    Transfers Overall transfer level: Needs assistance Equipment used: Hemi-walker Transfers: Sit to/from Stand, Bed to chair/wheelchair/BSC Sit to Stand: Min assist   Step pivot transfers: Mod assist       General transfer comment: minA to complete stand with hemiwalker, assist at R knee to prevent buckling. no overt LOB. x1 from EOB and x1 from chair    Ambulation/Gait Ambulation/Gait assistance: Mod assist, Max assist, +2 physical assistance Gait Distance (Feet): 8 Feet (+ 15 ft) Assistive device: Hemi-walker Gait Pattern/deviations: Step-through pattern, Decreased stance time - right, Decreased stride length, Decreased dorsiflexion - right, Decreased weight shift to right, Knee flexed in stance - right, Knee hyperextension - right, Ataxic, Scissoring, Trunk flexed, Narrow base of support Gait velocity: decreased Gait velocity interpretation: <1.31 ft/sec, indicative of household ambulator   General Gait Details: assist needed for balance and to manage stepping with RLE. assist to maintain wider BOS, cue for R knee extension, and cue for hip and trunk extension. pt initially able to make corrections with cues only, needing increased assist as pt fatigued     Modified Rankin (Stroke Patients Only) Modified Rankin (Stroke Patients Only) Pre-Morbid Rankin Score: No symptoms Modified Rankin: Moderately severe disability     Balance Overall balance assessment: Needs assistance Sitting-balance support: Single extremity supported, Feet supported Sitting balance-Leahy Scale: Fair Sitting balance - Comments: can sit statically without UE support and correct minor LOB   Standing balance support: Single extremity supported, Bilateral upper extremity supported, During functional activity, Reliant on assistive device for balance Standing balance-Leahy Scale: Poor Standing balance comment: reliant on UE and external support  Communication Communication Communication: Impaired Factors Affecting Communication: Difficulty expressing self;Reduced clarity of speech  Cognition Arousal: Alert Behavior During Therapy: WFL for tasks assessed/performed   PT - Cognitive impairments: No family/caregiver present to determine baseline, Awareness, Attention, Initiation, Sequencing, Problem solving, Safety/Judgement                       PT - Cognition Comments: pt needing repeated cues for attention to R extremities, positioning of BLE prior to stand and during stepping. increased cues for sequencing gait and awareness to extremity positioning with each step. Following commands: Impaired Following commands impaired: Follows multi-step commands inconsistently    Cueing Cueing Techniques: Verbal cues, Tactile cues, Gestural cues, Visual cues  Exercises Other Exercises Other Exercises: standing R knee extension x5    General Comments General comments (skin integrity, edema, etc.): VSS on RA      Pertinent Vitals/Pain Pain Assessment Pain Assessment: No/denies pain     PT Goals (current goals can now be found in the care plan section) Acute Rehab PT Goals Patient Stated Goal: back independent Progress towards PT goals: Progressing toward goals    Frequency    Min 4X/week      PT Plan      Co-evaluation PT/OT/SLP Co-Evaluation/Treatment: Yes Reason for Co-Treatment: For patient/therapist safety;To address functional/ADL transfers PT goals addressed during session: Mobility/safety with mobility;Balance;Proper use of DME;Strengthening/ROM        AM-PAC PT 6 Clicks Mobility   Outcome Measure  Help needed turning from your back to your side while in a flat bed without using bedrails?: A Little Help needed moving from lying on your back to sitting on the side of a flat bed without using bedrails?: A Little Help needed moving to and from a bed to a chair (including a wheelchair)?: A Lot Help  needed standing up from a chair using your arms (e.g., wheelchair or bedside chair)?: A Lot Help needed to walk in hospital room?: Total Help needed climbing 3-5 steps with a railing? : Total 6 Click Score: 12    End of Session Equipment Utilized During Treatment: Gait belt Activity Tolerance: Patient tolerated treatment well Patient left: in chair;with call bell/phone within reach;with chair alarm set Nurse Communication: Mobility status PT Visit Diagnosis: Other symptoms and signs involving the nervous system (R29.898)     Time: 9098-9068 PT Time Calculation (min) (ACUTE ONLY): 30 min  Charges:    $Gait Training: 8-22 mins PT General Charges $$ ACUTE PT VISIT: 1 Visit                     Izetta Call, PT, DPT   Acute Rehabilitation Department Office [phone] Secure Chat Communication Preferred   Izetta JULIANNA Call 03/25/2024, 10:22 AM

## 2024-03-25 NOTE — TOC CAGE-AID Note (Signed)
 Transition of Care [facility]) - CAGE-AID Screening   Patient Details  Name: Natasha Roach MRN: [id number] Date of Birth: [birth year]  Transition of [facility] CM/SW Contact:    Landry DELENA Senters, RN Phone Number: 03/25/2024, 12:59 PM   Clinical Narrative:  Patient denies need for inpatient or outpatient counseling resources.  CAGE-AID Screening: Substance Abuse Screening unable to be completed due to: : Patient Refused  Have You Ever Felt You Ought to Cut Down on Your Drinking or Drug Use?: No Have People Annoyed You By Critizing Your Drinking Or Drug Use?: No Have You Felt Bad Or Guilty About Your Drinking Or Drug Use?: No Have You Ever Had a Drink or Used Drugs First Thing In The Morning to Steady Your Nerves or to Get Rid of a Hangover?: No CAGE-AID Score: 0  Substance Abuse Education Offered: Yes

## 2024-03-25 NOTE — Progress Notes (Signed)
 Inpatient Rehab Admissions Coordinator:  Saw pt at bedside. Discussed support after discharge. She is going to further discuss support with significant other [name]. Will continue to follow.   Tinnie Yvone Cohens, MS, CCC-SLP Admissions Coordinator [phone]

## 2024-03-25 NOTE — Progress Notes (Signed)
 Occupational Therapy Treatment Patient Details Name: Natasha Roach MRN: [id number] DOB: 11/30/1960 Today's Date: 03/25/2024   History of present illness 63 yo female presenting 11/28 with R-sided weakness and slurred speech. (+) cocaine , NIH: 7, MRI showed: acute small vessel bland infarct in the left periventricular white matter extending to the posterior limb of the left internal capsule. Moderate cerebral white matter disease. PMHx: suicidal, HTN, HLD, CAD, cocaine  abuse, tobacco abuse, MI   OT comments  Patient able to progress to sitting EOB, and begin ADL completion from a sit to stand level.  Mod A for stand grooming this date, +2 for safety and prevent R knee buckle.  Continue OT in the acute setting to address deficits and Patient will benefit from intensive inpatient follow-up therapy, >3 hours/day.      If plan is discharge home, recommend the following:  A lot of help with walking and/or transfers;A lot of help with bathing/dressing/bathroom;Assistance with cooking/housework;Help with stairs or ramp for entrance;Assist for transportation   Equipment Recommendations       Recommendations for Other Services      Precautions / Restrictions Precautions Precautions: Fall Recall of Precautions/Restrictions: Impaired Restrictions Weight Bearing Restrictions Per Provider Order: No       Mobility Bed Mobility Overal bed mobility: Needs Assistance Bed Mobility: Supine to Sit     Supine to sit: Min assist          Transfers Overall transfer level: Needs assistance Equipment used: Hemi-walker Transfers: Sit to/from Stand, Bed to chair/wheelchair/BSC Sit to Stand: Min assist     Step pivot transfers: Mod assist           Balance Overall balance assessment: Needs assistance Sitting-balance support: Single extremity supported, Feet supported Sitting balance-Leahy Scale: Fair     Standing balance support: Reliant on assistive device for balance,  Bilateral upper extremity supported Standing balance-Leahy Scale: Poor                             ADL either performed or assessed with clinical judgement   ADL Overall ADL's : Needs assistance/impaired     Grooming: Minimal assistance;Standing           Upper Body Dressing : Moderate assistance;Sitting   Lower Body Dressing: Moderate assistance;+2 for physical assistance;Sit to/from stand   Toilet Transfer: Moderate assistance;BSC/3in1;Stand-pivot                  Extremity/Trunk Assessment Upper Extremity Assessment RUE Deficits / Details: trace movement shoulder and elbow; flaccid distally (full PROM) RUE Sensation: WNL RUE Coordination: decreased gross motor   Lower Extremity Assessment Lower Extremity Assessment: Defer to PT evaluation   Cervical / Trunk Assessment Cervical / Trunk Assessment: Normal    Vision Baseline Vision/History: 1 Wears glasses Patient Visual Report: No change from baseline     Perception Perception Perception: Not tested   Praxis Praxis Praxis: Not tested   Communication Communication Communication: Impaired Factors Affecting Communication: Difficulty expressing self;Reduced clarity of speech   Cognition Arousal: Alert Behavior During Therapy: WFL for tasks assessed/performed Cognition: Cognition impaired     Awareness: Online awareness impaired, Intellectual awareness intact   Attention impairment (select first level of impairment): Sustained attention Executive functioning impairment (select all impairments): Problem solving                   Following commands: Impaired Following commands impaired: Follows multi-step commands inconsistently  Cueing   Cueing Techniques: Verbal cues, Tactile cues, Gestural cues, Visual cues  Exercises      Shoulder Instructions       General Comments VSS on RA    Pertinent Vitals/ Pain       Pain Assessment Pain Assessment: No/denies pain  Home  Living                                          Prior Functioning/Environment              Frequency  Min 2X/week        Progress Toward Goals  OT Goals(current goals can now be found in the care plan section)  Progress towards OT goals: Progressing toward goals  Acute Rehab OT Goals OT Goal Formulation: With patient Time For Goal Achievement: 04/05/24 Potential to Achieve Goals: Good  Plan      Co-evaluation    PT/OT/SLP Co-Evaluation/Treatment: Yes Reason for Co-Treatment: For patient/therapist safety;To address functional/ADL transfers PT goals addressed during session: Mobility/safety with mobility;Balance;Proper use of DME;Strengthening/ROM OT goals addressed during session: ADL's and self-care      AM-PAC OT 6 Clicks Daily Activity     Outcome Measure   Help from another person eating meals?: A Little Help from another person taking care of personal grooming?: A Lot Help from another person toileting, which includes using toliet, bedpan, or urinal?: A Lot Help from another person bathing (including washing, rinsing, drying)?: A Lot Help from another person to put on and taking off regular upper body clothing?: A Lot Help from another person to put on and taking off regular lower body clothing?: A Lot 6 Click Score: 13    End of Session Equipment Utilized During Treatment: Gait belt  OT Visit Diagnosis: Unsteadiness on feet (R26.81);Other abnormalities of gait and mobility (R26.89);Muscle weakness (generalized) (M62.81);Other symptoms and signs involving cognitive function;Hemiplegia and hemiparesis Hemiplegia - Right/Left: Right Hemiplegia - dominant/non-dominant: Dominant Hemiplegia - caused by: Cerebral infarction   Activity Tolerance Patient tolerated treatment well   Patient Left in chair;with call bell/phone within reach;with chair alarm set   Nurse Communication Mobility status        Time: 9090-9067 OT Time Calculation  (min): 23 min  Charges: OT General Charges $OT Visit: 1 Visit OT Treatments $Self Care/Home Management : 8-22 mins  03/25/2024  RP, OTR/L  Acute Rehabilitation Services  Office:  [phone]   Natasha Roach 03/25/2024, 10:56 AM

## 2024-03-25 NOTE — Progress Notes (Signed)
 PROGRESS NOTE    Natasha Roach  FMW:[id number] DOB: July 08, 1960 DOA: 03/22/2024 PCP: Catalina Bare, MD   Chief Complaint  Patient presents with   Code Stroke    Brief Narrative:   Natasha Roach is a 63 y.o. 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  Prior to admission patient was on Plavix , baby aspirin  and high-dose Lipitor .  Patient presents to ED due to slurred speech, right-sided weakness, MRI brain significant for acute CVA, urine drug screen positive for cocaine , she is admitted for further workup.    Assessment & Plan:   Principal Problem:   Acute CVA (cerebrovascular accident) (HCC)   Acute CVA -MRI brain significant for acute CVA -CTA head and neck significant for stenosis and M1 segment of left MCA, no large vessel occlusion, but atherosclerosis calcification in aortic arch, carotid bulbs . - 2D echo is unremarkable -A1c 6.9 -LDL is 49 -Monitor on telemetry,No significant arrhythmias or only couple events of bigeminy -Allow for permissive hypertension -PT/OT/SLP consulted, recommendation for CIR -Currently on aspirin  and Plavix , will need follow-up with stroke clinic NP at [facility] 4 weeks from discharge  History of CAD/prior MI with stent Ischemic cardiomyopathy with recovered EF History of ventricular tachycardia post MI associated with prolonged QTc Currently asymptomatic Notable for bradycardia therefore not on beta-blocker Continue aspirin  and Plavix  DC within normal limit   Cocaine  use Tobacco abuse -Counseled   Hypertension Not on medications prior to admission Permissive hypertension as above, but overall all her blood pressure is controlled with no needs or new meds.    Dyslipidemia -LDL is 49 on Lipitor  80 mg daily prior to admission, will continue   Diabetes mellitus -A1c 6.9, which is diagnostic of DM, continue with carb modified diet       DVT prophylaxis: (Lovenox ) Code Status: (Full code) Family Communication: (None at bedside Disposition: Pending CIR approval  Status is: Inpatient    Consultants:  Neurology  Subjective:  No significant events overnight, she denies any complaints today.    Objective: Vitals:   03/25/24 0340 03/25/24 0500 03/25/24 0649 03/25/24 1216  BP: 113/76   135/78  Pulse: (!) 59   65  Resp: 18   19  Temp: 97.7 F (36.5 C)   98.9 F (37.2 C)  TempSrc: Oral   Oral  SpO2: 93%  96% 97%  Weight:  74.3 kg    Height:        Intake/Output Summary (Last 24 hours) at 03/25/2024 1352 Last data filed at 03/24/2024 1500 Gross per 24 hour  Intake --  Output 300 ml  Net -300 ml   Filed Weights   03/22/24 0404 03/24/24 0500 03/25/24 0500  Weight: 77.8 kg 78.3 kg 74.3 kg    Examination:  Awake Alert, Oriented X 3, with dysarthria has improved, but remains with significant right-sided weakness CTAB RRR +ve B.Sounds, Abd Soft, No tenderness, No rebound - guarding or rigidity. No Cyanosis, Clubbing or edema, No new Rash or bruise       Data Reviewed: I have personally reviewed following labs and imaging studies  CBC: Recent Labs  Lab 03/22/24 0339 03/22/24 0342 03/24/24 0347  WBC 11.5*  --  12.0*  NEUTROABS 6.6  --   --   HGB 13.3 14.3 13.8  HCT 43.7 42.0 43.5  MCV 86.2  --  83.8  PLT 234  --  226    Basic Metabolic Panel: Recent Labs  Lab 03/22/24 0339 03/22/24 0342 03/24/24 0347  NA 143 145 139  K 4.1 4.1 4.2  CL 111 110 106  CO2 22  --  22  GLUCOSE 122* 117* 137*  BUN 16 18 14   CREATININE 0.69 0.70 0.76  CALCIUM  8.8*  --  9.0  MG 2.1  --   --     GFR: Estimated Creatinine Clearance: 76.8 mL/min (by C-G formula based on SCr of 0.76 mg/dL).  Liver Function Tests: Recent Labs   Lab 03/22/24 0339  AST 19  ALT 19  ALKPHOS 99  BILITOT 0.4  PROT 5.7*  ALBUMIN 3.5    CBG: Recent Labs  Lab 03/22/24 0336 03/22/24 0712  GLUCAP 123* 108*     No results found for this or any previous visit (from the past 240 hours).       Radiology Studies: ECHOCARDIOGRAM COMPLETE Result Date: 03/23/2024    ECHOCARDIOGRAM REPORT   Patient Name:   Natasha Roach Date of Exam: 03/23/2024 Medical Rec #:  [id number]              Height:       67.0 in Accession #:    [id number]             Weight:       171.5 lb Date of Birth:  12/06/1960              BSA:          1.894 m Patient Age:    63 years               BP:           157/80 mmHg Patient Gender: F                      HR:           56 bpm. Exam Location:  Inpatient Procedure: 2D Echo, Cardiac Doppler and Color Doppler (Both Spectral and Color            Flow Doppler were utilized during procedure). Indications:    Stroke I63.9  History:        Patient has prior history of Echocardiogram examinations, most                 recent 01/16/2018. Cardiomyopathy, Previous Myocardial                 Infarction, PSA and Stroke; Risk Factors:Current Smoker,                 Hypertension and Dyslipidemia.  Sonographer:    Koleen Popper RDCS Referring Phys: [id number] ALLISON L ELLIS IMPRESSIONS  1. Left ventricular ejection fraction, by estimation, is 60 to 65%. The left ventricle has normal function. The left ventricle has no regional wall motion abnormalities. Left ventricular diastolic parameters were normal.  2. Right ventricular systolic function is normal. The right ventricular size is normal.  3. The mitral valve is normal in structure. Trivial mitral valve regurgitation. No evidence of mitral stenosis.  4. The aortic valve is normal in structure. Aortic valve regurgitation is not visualized. No aortic stenosis is present.  5. The inferior vena cava is normal in size with greater than 50% respiratory variability, suggesting right atrial  pressure of 3 mmHg. Conclusion(s)/Recommendation(s): No intracardiac  source of embolism detected on this transthoracic study. Consider a transesophageal echocardiogram to exclude cardiac source of embolism if clinically indicated. FINDINGS  Left Ventricle: Left ventricular ejection fraction, by estimation, is 60 to 65%. The left ventricle has normal function. The left ventricle has no regional wall motion abnormalities. The left ventricular internal cavity size was normal in size. There is  no left ventricular hypertrophy. Left ventricular diastolic parameters were normal. Normal left ventricular filling pressure. Right Ventricle: The right ventricular size is normal. No increase in right ventricular wall thickness. Right ventricular systolic function is normal. Left Atrium: Left atrial size was normal in size. Right Atrium: Right atrial size was normal in size. Pericardium: There is no evidence of pericardial effusion. Mitral Valve: The mitral valve is normal in structure. Trivial mitral valve regurgitation. No evidence of mitral valve stenosis. Tricuspid Valve: The tricuspid valve is normal in structure. Tricuspid valve regurgitation is trivial. No evidence of tricuspid stenosis. Aortic Valve: The aortic valve is normal in structure. Aortic valve regurgitation is not visualized. No aortic stenosis is present. Pulmonic Valve: The pulmonic valve was normal in structure. Pulmonic valve regurgitation is not visualized. No evidence of pulmonic stenosis. Aorta: The aortic root is normal in size and structure. Venous: The inferior vena cava is normal in size with greater than 50% respiratory variability, suggesting right atrial pressure of 3 mmHg. IAS/Shunts: No atrial level shunt detected by color flow Doppler.  LEFT VENTRICLE PLAX 2D LVIDd:         3.90 cm      Diastology LVIDs:         2.40 cm      LV e' medial:    7.40 cm/s LV PW:         0.90 cm      LV E/e' medial:  9.6 LV IVS:        1.10 cm      LV e' lateral:    12.50 cm/s LVOT diam:     1.90 cm      LV E/e' lateral: 5.7 LV SV:         56 LV SV Index:   30 LVOT Area:     2.84 cm  LV Volumes (MOD) LV vol d, MOD A2C: 133.0 ml LV vol s, MOD A2C: 40.8 ml LV SV MOD A2C:     92.2 ml RIGHT VENTRICLE             IVC RV Basal diam:  3.80 cm     IVC diam: 1.50 cm RV S prime:     18.10 cm/s TAPSE (M-mode): 2.7 cm LEFT ATRIUM             Index        RIGHT ATRIUM           Index LA diam:        2.80 cm 1.48 cm/m   RA Area:     12.40 cm LA Vol (A2C):   35.6 ml 18.79 ml/m  RA Volume:   27.70 ml  14.62 ml/m LA Vol (A4C):   50.5 ml 26.66 ml/m LA Biplane Vol: 42.1 ml 22.22 ml/m  AORTIC VALVE LVOT Vmax:   110.00 cm/s LVOT Vmean:  66.000 cm/s LVOT VTI:    0.199 m  AORTA Ao Root diam: 3.00 cm MITRAL VALVE MV Area (PHT): 2.69 cm    SHUNTS MV Decel Time: 282 msec    Systemic VTI:  0.20 m MV E velocity: 70.70 cm/s  Systemic Diam: 1.90 cm MV A velocity: 72.80 cm/s MV E/A ratio:  0.97 Wilbert Bihari MD Electronically signed by Wilbert Bihari MD Signature Date/Time: 03/23/2024/2:51:13 PM    Final         Scheduled Meds:  aspirin  EC  81 mg Oral Daily   atorvastatin   80 mg Oral QPM   clopidogrel   75 mg Oral Daily   enoxaparin  (LOVENOX ) injection  40 mg Subcutaneous Daily   pantoprazole  40 mg Oral Daily   Continuous Infusions:   LOS: 3 days      Brayton Lye, MD [facility]   To contact the attending provider between 7A-7P or the covering provider during after hours 7P-7A, please log into the web site [email or web address] and access using universal [place] password for that web site. If you do not have the password, please call the hospital operator.  03/25/2024, 1:52 PM

## 2024-03-25 NOTE — Plan of Care (Signed)

## 2024-03-25 NOTE — TOC Initial Note (Signed)
 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  Prior to admission patient was on Plavix , baby aspirin  and high-dose Lipitor .  Patient began experiencing slurred speech as well as right-sided weakness around 1 PM on Thanksgiving day.   Patient lives at home with S.O. Patient reports her SO is able to provide support, drive to appts, and is taking one week off work when patient d/c to home. Patient does not have any DME at home, does have PCP, manages own medications.   CM did speak to patient about cocaine  substance abuse. Patient refuses need for inpatient or outpatient counseling resources, reporting her stroke has been a wake up call for her.   Potential plan for admit to CIR.  CM will continue to follow.   Expected Discharge Plan: IP Rehab Facility Barriers to Discharge: Continued Medical Work up   Patient Goals and CMS Choice Patient states their goals for this hospitalization and ongoing recovery are:: Achieve IPR CMS Medicare.gov Compare Post Acute Care list provided to:: Other (Comment Required) (n/a) Choice offered to / list presented to : NA [place] ownership interest in [facility].provided to:: Parent NA    Expected Discharge Plan and Services In-house Referral: Clinical Social Work   Post Acute Care Choice: IP Rehab Living arrangements for the past 2 months: Apartment                                      Prior Living  Arrangements/Services Living arrangements for the past 2 months: Apartment Lives with:: Self, Significant Other Patient language and need for interpreter reviewed:: Yes Do you feel safe going back to the place where you live?: Yes      Need for Family Participation in Patient Care: Yes (Comment) Care giver support system in place?: Yes (comment)   Criminal Activity/Legal Involvement Pertinent to Current Situation/Hospitalization: No - Comment as needed  Activities of Daily Living   ADL Screening (condition at time of admission) Independently performs ADLs?: Yes (appropriate for developmental age) Is the patient deaf or have difficulty hearing?: No Does the patient have difficulty seeing, even when wearing glasses/contacts?: No Does the patient have difficulty concentrating, remembering, or making decisions?: No  Permission Sought/Granted                  Emotional Assessment Appearance:: Developmentally appropriate Attitude/Demeanor/Rapport: Engaged Affect (typically observed): Calm Orientation: : Oriented to Self, Oriented to Place, Oriented to  Time, Oriented to Situation Alcohol / Substance Use: Illicit Drugs Psych Involvement: No (comment)  Admission diagnosis:  Acute ischemic stroke (HCC) [I63.9] Stroke-like symptoms [R29.90] Patient Active Problem List   Diagnosis Date Noted   Acute CVA (cerebrovascular accident) (HCC) 03/23/2024   Acute ST elevation myocardial infarction (STEMI) involving left anterior descending (LAD) coronary artery (HCC) 10/27/2017   Prolonged QT interval 04/23/2016   CAD (coronary artery disease)  04/23/2016   H/O noncompliance with medical treatment, presenting hazards to health 04/23/2016   Hyperlipidemia LDL goal <70    Ischemic cardiomyopathy    Acute ST elevation myocardial infarction (STEMI) of anterior wall (HCC) 04/21/2016   HTN (hypertension) 04/21/2016   Tobacco abuse 04/21/2016   Ventricular tachycardia, sustained (HCC) 04/21/2016    Morbid obesity (HCC) 01/01/2016   Acute coronary syndrome (HCC) 12/31/2015   Cocaine  abuse (HCC) 12/31/2015   Leukocytosis 12/31/2015   PCP:  Catalina Bare, MD Pharmacy:   [facility] [id number]), [place] [address] [place] ([place]) [place] [address] Phone: [phone] Fax: [phone]  [facility] - [place], [place] - [address]  [place] [address] Phone: [phone] Fax: [phone]  [id number] - [place], [place] - [address] [place] [address] Phone: [phone] Fax: [phone]     Social Drivers of Health (SDOH) Social History: SDOH Screenings   Food Insecurity: No Food Insecurity (03/22/2024)  Housing: Unknown (03/22/2024)  Transportation Needs: No Transportation Needs (03/22/2024)  Utilities: Not At Risk (03/22/2024)  Depression (PHQ2-9): Medium Risk (05/26/2023)  Financial Resource Strain: Low Risk  (01/17/2018)  Physical Activity: Unknown (01/17/2018)  Social Connections: Unknown (03/22/2024)  Stress: Stress Concern Present (01/17/2018)  Tobacco Use: High Risk (03/22/2024)   SDOH Interventions:     Readmission Risk Interventions     No data to display

## 2024-03-26 ENCOUNTER — Inpatient Hospital Stay (HOSPITAL_COMMUNITY)

## 2024-03-26 DIAGNOSIS — R200 Anesthesia of skin: Secondary | ICD-10-CM | POA: Diagnosis not present

## 2024-03-26 LAB — GLUCOSE, CAPILLARY: Glucose-Capillary: 127 mg/dL — ABNORMAL HIGH (ref 70–99)

## 2024-03-26 NOTE — Evaluation (Signed)
 Speech Language Pathology Evaluation Patient Details Name: Natasha Roach MRN: [id number] DOB: Jul 28, 1960 Today's Date: 03/26/2024 Time: 9093-9067 SLP Time Calculation (min) (ACUTE ONLY): 26 min  Problem List:  Patient Active Problem List   Diagnosis Date Noted   Acute CVA (cerebrovascular accident) (HCC) 03/23/2024   Acute ST elevation myocardial infarction (STEMI) involving left anterior descending (LAD) coronary artery (HCC) 10/27/2017   Prolonged QT interval 04/23/2016   CAD (coronary artery disease) 04/23/2016   H/O noncompliance with medical treatment, presenting hazards to health 04/23/2016   Hyperlipidemia LDL goal <70    Ischemic cardiomyopathy    Acute ST elevation myocardial infarction (STEMI) of anterior wall (HCC) 04/21/2016   HTN (hypertension) 04/21/2016   Tobacco abuse 04/21/2016   Ventricular tachycardia, sustained (HCC) 04/21/2016   Morbid obesity (HCC) 01/01/2016   Acute coronary syndrome (HCC) 12/31/2015   Cocaine  abuse (HCC) 12/31/2015   Leukocytosis 12/31/2015   Past Medical History:  Past Medical History:  Diagnosis Date   CAD (coronary artery disease) 04/23/2016   a. 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 b. STEMI 03/2016 due to cocaine , occ mLAD s/p Synergy stent, EF 40% by cath, 55% by echo.   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 at time of STEMI 03/2016.   Past Surgical History:  Past Surgical History:  Procedure Laterality Date   ABDOMINAL HYSTERECTOMY     BREAST SURGERY     abcess   CARDIAC CATHETERIZATION  2004   CARDIAC CATHETERIZATION N/A 04/21/2016   Procedure:  Left Heart Cath and Coronary Angiography;  Surgeon: Debby DELENA Sor, MD;  Location: [facility] INVASIVE CV LAB;  Service: Cardiovascular;  Laterality: N/A;   CARDIAC CATHETERIZATION N/A 04/21/2016   Procedure: Coronary Stent Intervention;  Surgeon: Debby DELENA Sor, MD;  Location: [facility] INVASIVE CV LAB;  Service: Cardiovascular;  Laterality: N/A;   CORONARY STENT INTERVENTION N/A 10/27/2017   Procedure: CORONARY STENT INTERVENTION;  Surgeon: Jordan, Peter M, MD;  Location: [facility] INVASIVE CV LAB;  Service: Cardiovascular;  Laterality: N/A;   CORONARY/GRAFT ACUTE MI REVASCULARIZATION N/A 10/27/2017   Procedure: Coronary/Graft Acute MI Revascularization;  Surgeon: Jordan, Peter M, MD;  Location: [facility] INVASIVE CV LAB;  Service: Cardiovascular;  Laterality: N/A;   CORONARY/GRAFT ACUTE MI REVASCULARIZATION N/A 01/16/2018   Procedure: Coronary/Graft Acute MI Revascularization;  Surgeon: Jordan, Peter M, MD;  Location: [facility] INVASIVE CV LAB;  Service: Cardiovascular;  Laterality: N/A;   ELECTROPHYSIOLOGIC STUDY N/A 04/21/2016   Procedure: Cardioversion;  Surgeon: Debby DELENA Sor, MD;  Location: [facility] INVASIVE CV LAB;  Service: Cardiovascular;  Laterality: N/A;   LEFT HEART CATH AND CORONARY ANGIOGRAPHY N/A 10/27/2017   Procedure: LEFT HEART CATH AND CORONARY ANGIOGRAPHY;  Surgeon: Jordan, Peter M, MD;  Location: [facility] INVASIVE CV LAB;  Service: Cardiovascular;  Laterality: N/A;   LEFT HEART CATH AND CORONARY ANGIOGRAPHY N/A 01/16/2018   Procedure: LEFT HEART CATH AND CORONARY ANGIOGRAPHY;  Surgeon: Jordan, Peter M, MD;  Location: [facility] INVASIVE CV LAB;  Service: Cardiovascular;  Laterality: N/A;   HPI:  63 yo female presenting 11/28 with R-sided weakness and slurred speech. (+)  cocaine , NIH: 7, MRI showed acute small vessel bland infarct in the left periventricular white matter extending to the posterior limb of the left internal capsule. Moderate cerebral white matter disease. Morning of 12/2 pt c/o tighteness to the R side of her head, rapid  response called and CT No new intracranial abnormality. PMHx: suicidal, HTN, HLD, CAD, cocaine  abuse, tobacco abuse, MI.   Assessment / Plan / Recommendation Clinical Impression  Pt lives with her significant other and reports her speech is slurred but denied cognitive impairments. Language is intact, followed 3 step command and expressed thoughts accurately. She expressed concern with her significant physical deficits form stroke and became slightly labile. Speech is intelligible however there is imprecise articulation in conversation. Given parts of the Cognistat  and 2 subtests from [facility] and scored in average range except 4 word recall (moderate impairment) and self corrected calculation accurately. MD present when SLP arrrived and pt able to recall all information heard accurately. Overall, her cognition for acute venue appears functional and SLP will work with pt targeting her dysarthria on acute. Recommend continued therapy in inpatient rehab > 3 hours and assessment can continue for higher level cognitive tasks in more challenging environment.    SLP Assessment  SLP Recommendation/Assessment: Patient needs continued Speech Language Pathology Services SLP Visit Diagnosis: Dysarthria and anarthria (R47.1)     Assistance Recommended at Discharge  PRN  Functional Status Assessment Patient has had a recent decline in their functional status and demonstrates the ability to make significant improvements in function in a reasonable and predictable amount of time.  Frequency and Duration min 2x/week  2 weeks      SLP Evaluation Cognition  Overall Cognitive Status: Impaired/Different from baseline (only for 4 word recall, functional memory appears in normal limits for tasks assessed) Arousal/Alertness: Awake/alert Orientation Level: Oriented X4 (oriented to time except date) Year: 2025 Month: December Day of Week: Correct Attention: Sustained Sustained Attention: Appears intact Memory:  Impaired Memory Impairment: Retrieval deficit;Storage deficit Awareness: Appears intact Problem Solving: Appears intact Behaviors:  (concerned about physical deficits- almost labile) Safety/Judgment: Appears intact       Comprehension  Auditory Comprehension Overall Auditory Comprehension: Appears within functional limits for tasks assessed Commands: Within Functional Limits Visual Recognition/Discrimination Discrimination: Not tested Reading Comprehension Reading Status: Not tested    Expression Expression Primary Mode of Expression: Verbal Verbal Expression Overall Verbal Expression: Appears within functional limits for tasks assessed Initiation: No impairment Level of Generative/Spontaneous Verbalization: Conversation Repetition: No impairment Naming: No impairment Pragmatics: No impairment (tangential at times) Written Expression Dominant Hand: Right Written Expression: Not tested   Oral / Motor  Oral Motor/Sensory Function Overall Oral Motor/Sensory Function: Mild impairment Facial ROM: Reduced right;Suspected CN VII (facial) dysfunction Facial Symmetry: Abnormal symmetry right;Suspected CN VII (facial) dysfunction Motor Speech Overall Motor Speech: Impaired Respiration: Within functional limits Phonation: Normal Resonance: Within functional limits Articulation: Impaired Level of Impairment: Conversation Intelligibility: Intelligible Motor Planning: Within functional limits Motor Speech Errors: Not applicable            Dustin Olam Bull 03/26/2024, 11:29 AM

## 2024-03-26 NOTE — Plan of Care (Signed)

## 2024-03-26 NOTE — Progress Notes (Signed)
 Physical Therapy Treatment Patient Details Name: Natasha Roach MRN: [id number] DOB: 07-25-1960 Today's Date: 03/26/2024   History of Present Illness 63 yo female presenting 11/28 with R-sided weakness and slurred speech. (+) cocaine , NIH: 7, MRI showed: acute small vessel bland infarct in the left periventricular white matter extending to the posterior limb of the left internal capsule. Moderate cerebral white matter disease. PMHx: suicidal, HTN, HLD, CAD, cocaine  abuse, tobacco abuse, MI    PT Comments  Pt received in supine and agreeable to session. Pt initially emotional about deficits, but demonstrates good spirits and motivation at end of session. Pt able to tolerate 3 short gait trials with hall rail support. Pt demonstrates improved stability in static standing and good activity tolerance this session. Pt continues to require assist with RLE management due to weakness and instability. Pt also requires cues for awareness and safety throughout session. Pt continues to benefit from PT services to progress toward functional mobility goals.     If plan is discharge home, recommend the following: A lot of help with walking and/or transfers;A lot of help with bathing/dressing/bathroom;Assistance with cooking/housework;Help with stairs or ramp for entrance   Can travel by private vehicle        Equipment Recommendations  Wheelchair (measurements PT);Wheelchair cushion (measurements PT);Rolling walker (2 wheels)    Recommendations for Other Services       Precautions / Restrictions Precautions Precautions: Fall Recall of Precautions/Restrictions: Impaired Restrictions Weight Bearing Restrictions Per Provider Order: No     Mobility  Bed Mobility Overal bed mobility: Needs Assistance Bed Mobility: Supine to Sit     Supine to sit: Min assist     General bed mobility comments: increased time/effort and assist for trunk stability. Cues for attention to R extremities     Transfers Overall transfer level: Needs assistance Equipment used: 2 person hand held assist (hall rail) Transfers: Sit to/from Stand, Bed to chair/wheelchair/BSC Sit to Stand: Min assist   Step pivot transfers: Min assist, +2 safety/equipment       General transfer comment: STS from EOB and recliner with LUE support and min A for rise. Cues for safety due to some impulsivity. Pivot from EOB to recliner with R knee blocking and assist for balance. pt demonstrates good carryover of sequencing and hand placement from previous session.    Ambulation/Gait Ambulation/Gait assistance: Mod assist, +2 safety/equipment Gait Distance (Feet): 10 Feet (x3) Assistive device:  (hall rail) Gait Pattern/deviations: Step-to pattern, Step-through pattern, Decreased step length - right, Knees buckling, Trunk flexed, Knee hyperextension - right, Decreased stance time - right, Narrow base of support Gait velocity: decreased     General Gait Details: Assist for R foot placement and knee blocking due to instability. Cues for upright posture due to tendency for R trunk rotation during LLE swing through, but able to improve.   Stairs             Wheelchair Mobility     Tilt Bed    Modified Rankin (Stroke Patients Only) Modified Rankin (Stroke Patients Only) Pre-Morbid Rankin Score: No symptoms Modified Rankin: Moderately severe disability     Balance Overall balance assessment: Needs assistance Sitting-balance support: Single extremity supported, Feet supported Sitting balance-Leahy Scale: Fair Sitting balance - Comments: CGA sitting EOB   Standing balance support: Reliant on assistive device for balance, Bilateral upper extremity supported Standing balance-Leahy Scale: Poor Standing balance comment: reliant on UE and external support  Communication Communication Communication: Impaired Factors Affecting Communication: Difficulty expressing  self;Reduced clarity of speech  Cognition Arousal: Alert Behavior During Therapy: WFL for tasks assessed/performed   PT - Cognitive impairments: No family/caregiver present to determine baseline, Awareness, Sequencing, Problem solving, Safety/Judgement                         Following commands: Impaired Following commands impaired: Follows multi-step commands inconsistently    Cueing Cueing Techniques: Verbal cues, Tactile cues, Gestural cues, Visual cues  Exercises      General Comments        Pertinent Vitals/Pain Pain Assessment Pain Assessment: No/denies pain     PT Goals (current goals can now be found in the care plan section) Acute Rehab PT Goals Patient Stated Goal: back independent Progress towards PT goals: Progressing toward goals    Frequency    Min 4X/week       AM-PAC PT 6 Clicks Mobility   Outcome Measure  Help needed turning from your back to your side while in a flat bed without using bedrails?: A Little Help needed moving from lying on your back to sitting on the side of a flat bed without using bedrails?: A Little Help needed moving to and from a bed to a chair (including a wheelchair)?: A Lot Help needed standing up from a chair using your arms (e.g., wheelchair or bedside chair)?: A Little Help needed to walk in hospital room?: A Lot Help needed climbing 3-5 steps with a railing? : Total 6 Click Score: 14    End of Session Equipment Utilized During Treatment: Gait belt Activity Tolerance: Patient tolerated treatment well Patient left: in chair;with call bell/phone within reach;with chair alarm set;with family/visitor present Nurse Communication: Mobility status PT Visit Diagnosis: Other symptoms and signs involving the nervous system (R29.898)     Time: 1139-1209 PT Time Calculation (min) (ACUTE ONLY): 30 min  Charges:    $Gait Training: 8-22 mins $Therapeutic Activity: 8-22 mins PT General Charges $$ ACUTE PT VISIT: 1  Visit                    Darryle George, PTA Acute Rehabilitation Services Secure Chat Preferred  [phone]    Darryle George 03/26/2024, 1:28 PM

## 2024-03-26 NOTE — Plan of Care (Signed)
   Problem: Coping: Goal: Ability to adjust to condition or change in health will improve Outcome: Progressing   Problem: Fluid Volume: Goal: Ability to maintain a balanced intake and output will improve Outcome: Progressing

## 2024-03-26 NOTE — Progress Notes (Addendum)
 Inpatient Rehab Admissions Coordinator:   Spoke to pt on the phone, [name] at her bedside and on speaker phone.  She confirms that no movement has been made on FMLA at this point and she is considering staying in [place] with her daughter.  She was emotional today, stating she was confused and needed some time to process.  If she is able to stay with her daughter we can continue to consider for CIR here at [facility].  I did briefly discuss other AIRs vs SNF, and will touch base with MD/TOC about options moving forward.   13:35 pt called me back.  She asked about getting someone else to come stay with her and we talked about potential options, but that ultimately any private caregivers immediately available for support would be out of pocket and medicaid caregivers would not be immediately available for support.  She asked if she could choose to not have caregiver support at home.  I advised that based on current mobility documentation it would not be safe for her to be home alone but if the MD felt she could make her own decisions, all I could do would be make recommendations. I passed along to [facility] and MD.    Reche Lowers, PT, DPT Admissions Coordinator [phone] 03/26/24 1:31 PM

## 2024-03-26 NOTE — Progress Notes (Signed)
 PROGRESS NOTE    Natasha Roach  FMW:[id number] DOB: 1960-06-18 DOA: 03/22/2024 PCP: Catalina Bare, MD   Chief Complaint  Patient presents with   Code Stroke    Brief Narrative:   Natasha Roach is a 63 y.o. 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  Prior to admission patient was on Plavix , baby aspirin  and high-dose Lipitor .  Patient presents to ED due to slurred speech, right-sided weakness, MRI brain significant for acute CVA, urine drug screen positive for cocaine , she is admitted for further workup.    Assessment & Plan:   Principal Problem:   Acute CVA (cerebrovascular accident) (HCC)   Acute CVA -MRI brain significant for acute CVA -CTA head and neck significant for stenosis and M1 segment of left MCA, no large vessel occlusion, but atherosclerosis calcification in aortic arch, carotid bulbs . - 2D echo is unremarkable -A1c 6.9 -LDL is 49 -Monitor on telemetry,No significant arrhythmias or only couple events of bigeminy -Allow for permissive hypertension -PT/OT/SLP consulted, recommendation for CIR, but patient cannot make final decision, and she keeps changing between CIR and going home -Currently on aspirin  and Plavix , will need follow-up with stroke clinic NP at [facility] 4 weeks from discharge - With response was called this morning as she was noted by staff to have increased slurred speech, but she was just woken up this morning by staff when she was noted to have symptoms, was at baseline upon my evaluation, repeat CT head with no acute findings, with expected CT appearance of left corona radiata and lentiform infarct  History of CAD/prior MI with stent Ischemic cardiomyopathy with recovered EF History  of ventricular tachycardia post MI associated with prolonged QTc Currently asymptomatic Notable for bradycardia therefore not on beta-blocker Continue aspirin  and Plavix  DC within normal limit   Cocaine  use Tobacco abuse -Counseled   Hypertension Not on medications prior to admission Permissive hypertension as above, but overall all her blood pressure is controlled with no needs or new meds.   Dyslipidemia -LDL is 49 on Lipitor  80 mg daily prior to admission, will continue   Diabetes mellitus -A1c 6.9, which is diagnostic of DM, continue with carb modified diet       DVT prophylaxis: (Lovenox ) Code Status: (Full code) Family Communication: (None at bedside, I have left her significant other a voice message) Disposition: Awaiting disposition plan, patient undecided about CIR.  Status is: Inpatient    Consultants:  Neurology  Subjective:  No significant events overnight, this morning she had some increased slurred speech, but she was just awake and up, upon my evaluation she was back at baseline.  Objective: Vitals:   03/26/24 0329 03/26/24 0500 03/26/24 0700 03/26/24 1208  BP: 111/66  115/81 136/89  Pulse: (!) 59     Resp: 20     Temp: 98.2 F (36.8 C)  97.6 F (36.4 C) 97.7 F (36.5 C)  TempSrc: Oral  Oral Oral  SpO2: 94%     Weight:  81.7 kg    Height:        Intake/Output Summary (Last 24 hours) at 03/26/2024 1505 Last data filed at 03/25/2024 1520 Gross per 24 hour  Intake --  Output 550 ml  Net -550 ml   Filed Weights   03/24/24 0500 03/25/24  0500 03/26/24 0500  Weight: 78.3 kg 74.3 kg 81.7 kg    Examination:  Awake Alert, Oriented X 3, she is, with mild dysarthria at baseline, improving, remains with significant right sided weakness upper > lower  CTAB RRR +ve B.Sounds, Abd Soft, No tenderness, No rebound - guarding or rigidity. No Cyanosis, Clubbing or edema, No new Rash or bruise       Data Reviewed: I have personally reviewed  following labs and imaging studies  CBC: Recent Labs  Lab 03/22/24 0339 03/22/24 0342 03/24/24 0347  WBC 11.5*  --  12.0*  NEUTROABS 6.6  --   --   HGB 13.3 14.3 13.8  HCT 43.7 42.0 43.5  MCV 86.2  --  83.8  PLT 234  --  226    Basic Metabolic Panel: Recent Labs  Lab 03/22/24 0339 03/22/24 0342 03/24/24 0347  NA 143 145 139  K 4.1 4.1 4.2  CL 111 110 106  CO2 22  --  22  GLUCOSE 122* 117* 137*  BUN 16 18 14   CREATININE 0.69 0.70 0.76  CALCIUM  8.8*  --  9.0  MG 2.1  --   --     GFR: Estimated Creatinine Clearance: 80.1 mL/min (by C-G formula based on SCr of 0.76 mg/dL).  Liver Function Tests: Recent Labs  Lab 03/22/24 0339  AST 19  ALT 19  ALKPHOS 99  BILITOT 0.4  PROT 5.7*  ALBUMIN 3.5    CBG: Recent Labs  Lab 03/22/24 0336 03/22/24 0712 03/26/24 0726  GLUCAP 123* 108* 127*     No results found for this or any previous visit (from the past 240 hours).       Radiology Studies: CT HEAD WO CONTRAST ( ) Result Date: 03/26/2024 EXAM: CT HEAD WITHOUT CONTRAST 03/26/2024 07:51:00 AM TECHNIQUE: CT of the head was performed without the administration of intravenous contrast. Automated exposure control, iterative reconstruction, and/or weight based adjustment of the mA/kV was utilized to reduce the radiation dose to as low as reasonably achievable. COMPARISON: Brain MRI and Head CT 03/22/2024. CLINICAL HISTORY: 63 year old female. Known recent acute CVA, new right facial numbness. FINDINGS: BRAIN AND VENTRICLES: No acute hemorrhage. Hypodense cytotoxic edema now visible in the posterior left corona radiata tracking to the lentiform, corresponding to DWI abnormality. No hemorrhagic transformation or mass effect (series 3 image 17). Gray white matter differentiation stable elsewhere. Calcified atherosclerosis at the skull base. No suspicious intracranial vascular hyperdensity. No hydrocephalus. No extra-axial collection. No mass effect or midline shift. ORBITS:  No acute abnormality. SINUSES: Paranasal sinuses remain well aerated. SOFT TISSUES AND SKULL: No acute soft tissue abnormality. No skull fracture. Middle ears and mastoids remain well aerated. IMPRESSION: 1. Expected CT appearance of left corona radiata and lentiform infarct. No hemorrhagic transformation or mass effect. 2. No new intracranial abnormality. Electronically signed by: Helayne Hurst MD 03/26/2024 07:56 AM EST RP Workstation: [id number]        Scheduled Meds:  aspirin  EC  81 mg Oral Daily   atorvastatin   80 mg Oral QPM   clopidogrel   75 mg Oral Daily   enoxaparin  (LOVENOX ) injection  40 mg Subcutaneous Daily   pantoprazole  40 mg Oral Daily   Continuous Infusions:   LOS: 4 days      Brayton Lye, MD [facility]   To contact the attending provider between 7A-7P or the covering provider during after hours 7P-7A, please log into the web site [email or web address] and access using universal [place] password for that web  site. If you do not have the password, please call the hospital operator.  03/26/2024, 3:05 PM

## 2024-03-26 NOTE — Significant Event (Addendum)
 Rapid Response Event Note   Reason for Call :  R face numbness, HA  Initial Focused Assessment:  Pt lying in bed with eyes open. She is c/o tighteness to the R side of her head. She said this started last night. Her NIH is 8 for R facial droop, R sided weakness, and dysarthria. These symptoms are consistent with charted symptoms. She has no LVO signs. She is vey emotional as she has been thinking of everything that has gone on the last few days-support given.   T-97.6, HR-59, BP-115/81, RR-18, SpO2-97% on RA  Interventions:  CBG-127 CT head-1. Expected CT appearance of left corona radiata and lentiform infarct. No hemorrhagic transformation or mass effect. 2. No new intracranial abnormality. Plan of Care: CT head with no acute changes.  Complete another stroke swallow screen prior to giving POs. Please call RRT if further assistance needed.   Event Summary:   MD Notified: Elgergawy notified by bedside RN Call Time:0706 Arrival [phone] End Upfz:[phone]  Tish Graeme Piety, RN

## 2024-03-27 ENCOUNTER — Inpatient Hospital Stay (HOSPITAL_COMMUNITY)

## 2024-03-27 DIAGNOSIS — F1410 Cocaine abuse, uncomplicated: Secondary | ICD-10-CM | POA: Diagnosis not present

## 2024-03-27 DIAGNOSIS — I6381 Other cerebral infarction due to occlusion or stenosis of small artery: Secondary | ICD-10-CM | POA: Diagnosis not present

## 2024-03-27 DIAGNOSIS — S0990XA Unspecified injury of head, initial encounter: Secondary | ICD-10-CM | POA: Diagnosis not present

## 2024-03-27 DIAGNOSIS — E785 Hyperlipidemia, unspecified: Secondary | ICD-10-CM | POA: Diagnosis not present

## 2024-03-27 DIAGNOSIS — I639 Cerebral infarction, unspecified: Secondary | ICD-10-CM | POA: Diagnosis not present

## 2024-03-27 NOTE — Plan of Care (Signed)
  Problem: Fluid Volume: Goal: Ability to maintain a balanced intake and output will improve Outcome: Progressing   Problem: Metabolic: Goal: Ability to maintain appropriate glucose levels will improve Outcome: Progressing   

## 2024-03-27 NOTE — Plan of Care (Signed)
  Problem: Coping: Goal: Will verbalize positive feelings about self Outcome: Progressing   Problem: Education: Goal: Knowledge of General Education information will improve Description: Including pain rating scale, medication(s)/side effects and non-pharmacologic comfort measures Outcome: Progressing   Problem: Coping: Goal: Level of anxiety will decrease Outcome: Progressing   Problem: Pain Managment: Goal: General experience of comfort will improve and/or be controlled Outcome: Progressing   Problem: Safety: Goal: Ability to remain free from injury will improve Outcome: Progressing   Problem: Skin Integrity: Goal: Risk for impaired skin integrity will decrease Outcome: Not Progressing

## 2024-03-27 NOTE — Progress Notes (Addendum)
 Physical Therapy Treatment Patient Details Name: Natasha Roach MRN: [id number] DOB: 12-01-1960 Today's Date: 03/27/2024   History of Present Illness 63 yo female presenting 11/28 with R-sided weakness and slurred speech. (+) cocaine , NIH: 7, MRI showed: acute small vessel bland infarct in the left periventricular white matter extending to the posterior limb of the left internal capsule. Moderate cerebral white matter disease. PMHx: suicidal, HTN, HLD, CAD, cocaine  abuse, tobacco abuse, MI    PT Comments  Pt benefits from cues throughout functional mobility due to difficulty sequencing and coordinating movements. Rolling walker trialed with standing mobility today, pt demonstrates poor management due to R sided weakness and improved with unilateral support from railing in hallway. A second PT was positioned in front of patient during ambulation to position R LE and prevent hyperextension with weight bearing. Two seated rest breaks taken between bouts of ambulation. Encouragement provided throughout. Pt would benefit from continued PT services focused on ROM, strengthening, balance, and gait training to further promote functional mobility.     If plan is discharge home, recommend the following: A lot of help with walking and/or transfers;A lot of help with bathing/dressing/bathroom;Assistance with cooking/housework;Assist for transportation;Help with stairs or ramp for entrance   Can travel by private vehicle        Equipment Recommendations       Recommendations for Other Services       Precautions / Restrictions Restrictions Weight Bearing Restrictions Per Provider Order: No     Mobility  Bed Mobility Overal bed mobility: Needs Assistance Bed Mobility: Supine to Sit   Sidelying to sit: Min assist       General bed mobility comments: Pt initially impulsive with transer and was unable to appropriately sequence transfer. Pt improved with single step commands and was able to  complete minA.    Transfers Overall transfer level: Needs assistance Equipment used: Standard walker Transfers: Sit to/from Stand Sit to Stand: Min assist, +2 physical assistance           General transfer comment: Assist x2 needed for initial balance with rolling walker. Difficulty managing walker due to R UE weakness, was unable to grip walker. Transitioned to hallway to utilize side rail on L side for support and improved to minAx1.    Ambulation/Gait Ambulation/Gait assistance: Mod assist, +2 physical assistance, +2 safety/equipment Gait Distance (Feet): 30 Feet Assistive device: Standard walker (L side railing in hallway) Gait Pattern/deviations: Step-to pattern, Decreased step length - right, Decreased step length - left, Decreased stance time - right, Decreased stride length, Decreased dorsiflexion - right, Decreased weight shift to right, Knee hyperextension - right, Narrow base of support       General Gait Details: Pt with R knee hyperextension in weight bearing, prevented with manual assist during ambulation. Pt with difficulty coordinating step pattern but benefits from visual and verbal cues to increase step width. Benefits from unilateral DME due to significant R UE weakness. Improved posture and stepping pattern with use of hallway railing.   Stairs             Wheelchair Mobility     Tilt Bed    Modified Rankin (Stroke Patients Only) Modified Rankin (Stroke Patients Only) Pre-Morbid Rankin Score: No symptoms Modified Rankin: Moderate disability     Balance Overall balance assessment: Needs assistance Sitting-balance support: Single extremity supported Sitting balance-Leahy Scale: Good Sitting balance - Comments: Occasionally leans to the R but is able to recognize and correct with minimal cues Postural control: Right lateral  lean Standing balance support: Bilateral upper extremity supported, Reliant on assistive device for balance Standing  balance-Leahy Scale: Poor                              Communication Communication Communication: Impaired Factors Affecting Communication: Reduced clarity of speech;Difficulty expressing self  Cognition Arousal: Alert Behavior During Therapy: Lability, WFL for tasks assessed/performed   PT - Cognitive impairments: Safety/Judgement, Sequencing                         Following commands: Intact      Cueing Cueing Techniques: Verbal cues, Tactile cues, Visual cues  Exercises      General Comments General comments (skin integrity, edema, etc.): Muscle atrophy noted R LE below the knee, pt states history of nerve compression at L4-5 vertebrae, notes this was present prior to CVA.      Pertinent Vitals/Pain Pain Assessment Pain Assessment: No/denies pain    Home Living                          Prior Function            PT Goals (current goals can now be found in the care plan section) Progress towards PT goals: Progressing toward goals    Frequency    Min 4X/week      PT Plan      Co-evaluation     PT goals addressed during session: Mobility/safety with mobility;Balance        AM-PAC PT 6 Clicks Mobility   Outcome Measure  Help needed turning from your back to your side while in a flat bed without using bedrails?: A Little Help needed moving from lying on your back to sitting on the side of a flat bed without using bedrails?: A Little Help needed moving to and from a bed to a chair (including a wheelchair)?: A Lot Help needed standing up from a chair using your arms (e.g., wheelchair or bedside chair)?: A Lot Help needed to walk in hospital room?: A Lot Help needed climbing 3-5 steps with a railing? : Total 6 Click Score: 13    End of Session Equipment Utilized During Treatment: Gait belt Activity Tolerance: Patient tolerated treatment well Patient left: in chair;with call bell/phone within reach;with chair alarm  set Nurse Communication: Mobility status PT Visit Diagnosis: Unsteadiness on feet (R26.81);Muscle weakness (generalized) (M62.81);Hemiplegia and hemiparesis Hemiplegia - Right/Left: Right Hemiplegia - dominant/non-dominant: Dominant Hemiplegia - caused by: Cerebral infarction     Time:  -     Charges:    $Gait Training: 23-37 mins                       Ashlyn Loring PT,DPT   Ashlyn K Loring 03/27/2024, 1:56 PM

## 2024-03-27 NOTE — Progress Notes (Signed)
 PROGRESS NOTE        PATIENT DETAILS Name: Natasha Roach Age: 63 y.o. Sex: female Date of Birth: [birth year] Admit Date: 03/22/2024 Admitting Physician Eva KATHEE Pore, DO ERE:[id number], Zachary, MD  Brief Summary: Patient is a 63 y.o.  female with history of CAD-s/p PCI to LAD in 2017, HFrEF with recovered EF, HTN, HLD, cocaine  use, noncompliance to medications-presented with left-sided weakness-found to have acute CVA.  Significant events: 11/28>> admit to [facility].  Significant studies: 11/28>> CT angio head/neck: Mild stenosis distal M1, no LVO, no significant carotid stenosis. 11/28>> MRI brain: Acute infarct left periventricular white matter extending into posterior limb of left internal capsule 11/28>> UDS:+ve cocaine  11/28>> A1c: 6.9 11/29>> LDL: 49 11/29>> echo: EF 60-65%, RV systolic function normal. 12/02>> CT head: Expected CT appearance of left corona radiata/lentiform infarct.  Significant microbiology data: None  Procedures: None  Consults: Neurology  Subjective: Mild dysarthria-claims as the day goes along her dysarthria improves-continues to have significant right upper extremity weakness.  Objective: Vitals: Blood pressure 124/78, pulse (!) 55, temperature 98 F (36.7 C), temperature source Oral, resp. rate 20, height 5' 7 (1.702 m), weight 79 kg, SpO2 95%.   Exam: Gen Exam:Alert awake-not in any distress HEENT:atraumatic, normocephalic Chest: B/L clear to auscultation anteriorly CVS:S1S2 regular Abdomen:soft non tender, non distended Extremities:no edema Neurology: Right facial droop, predominantly RUE>> RLE weakness. Skin: no rash  Pertinent Labs/Radiology:    Latest Ref Rng & Units 03/24/2024    3:47 AM 03/22/2024    3:42 AM 03/22/2024    3:39 AM  CBC  WBC 4.0 - 10.5 K/uL 12.0   11.5   Hemoglobin 12.0 - 15.0 g/dL 86.1  85.6  86.6   Hematocrit 36.0 - 46.0 % 43.5  42.0  43.7   Platelets 150 - 400 K/uL  226   234     Lab Results  Component Value Date   NA 139 03/24/2024   K 4.2 03/24/2024   CL 106 03/24/2024   CO2 22 03/24/2024      Assessment/Plan: Acute CVA Thought to be secondary to small vessel disease Workup as above Continues to have right-sided deficits and dysarthria (per patient-dysarthria worse in the morning but improves as the day goes along) Continue aspirin /Plavix /Lipitor  CIR being planned on discharge.  History of CAD-s/p PCI 2017 No anginal symptoms Aspirin /Plavix /statin Given history of cocaine -bradycardia-not on beta-blocker  Chronic HFrEF with recovered EF Euvolemic  HTN BP currently stable without use of any antihypertensives  HLD Statin  DM-2 CBG stable-SSI  Recent Labs    03/26/24 0726  GLUCAP 127*    Cocaine /tobacco use Counseled  Code status:   Code Status: Full Code   DVT Prophylaxis: enoxaparin  (LOVENOX ) injection 40 mg Start: 03/22/24 1000 SCDs Start: 03/22/24 0509    Family Communication: None at bedside   Disposition Plan: Status is: Inpatient Remains inpatient appropriate because: Severity of illness   Planned Discharge Destination:Rehabilitation facility   Diet: Diet Order             Diet Heart Room service appropriate? Yes with Assist; Fluid consistency: Thin  Diet effective now                     Antimicrobial agents: Anti-infectives (From admission, onward)    None        MEDICATIONS: Scheduled Meds:  aspirin  EC  81 mg Oral Daily   atorvastatin   80 mg Oral QPM   clopidogrel   75 mg Oral Daily   enoxaparin  (LOVENOX ) injection  40 mg Subcutaneous Daily   pantoprazole  40 mg Oral Daily   Continuous Infusions: PRN Meds:.acetaminophen  **OR** acetaminophen , ipratropium-albuterol, LORazepam , melatonin, nitroGLYCERIN , ondansetron  (ZOFRAN ) IV   I have personally reviewed following labs and imaging studies  LABORATORY DATA: CBC: Recent Labs  Lab 03/22/24 0339 03/22/24 0342 03/24/24 0347   WBC 11.5*  --  12.0*  NEUTROABS 6.6  --   --   HGB 13.3 14.3 13.8  HCT 43.7 42.0 43.5  MCV 86.2  --  83.8  PLT 234  --  226    Basic Metabolic Panel: Recent Labs  Lab 03/22/24 0339 03/22/24 0342 03/24/24 0347  NA 143 145 139  K 4.1 4.1 4.2  CL 111 110 106  CO2 22  --  22  GLUCOSE 122* 117* 137*  BUN 16 18 14   CREATININE 0.69 0.70 0.76  CALCIUM  8.8*  --  9.0  MG 2.1  --   --     GFR: Estimated Creatinine Clearance: 79 mL/min (by C-G formula based on SCr of 0.76 mg/dL).  Liver Function Tests: Recent Labs  Lab 03/22/24 0339  AST 19  ALT 19  ALKPHOS 99  BILITOT 0.4  PROT 5.7*  ALBUMIN 3.5   No results for input(s): LIPASE, AMYLASE in the last 168 hours. No results for input(s): AMMONIA in the last 168 hours.  Coagulation Profile: Recent Labs  Lab 03/22/24 0339  INR 0.9    Cardiac Enzymes: No results for input(s): CKTOTAL, CKMB, CKMBINDEX, TROPONINI in the last 168 hours.  BNP (last 3 results) No results for input(s): PROBNP in the last 8760 hours.  Lipid Profile: No results for input(s): CHOL, HDL, LDLCALC, TRIG, CHOLHDL, LDLDIRECT in the last 72 hours.  Thyroid Function Tests: No results for input(s): TSH, T4TOTAL, FREET4, T3FREE, THYROIDAB in the last 72 hours.  Anemia Panel: No results for input(s): VITAMINB12, FOLATE, FERRITIN, TIBC, IRON, RETICCTPCT in the last 72 hours.  Urine analysis:    Component Value Date/Time   COLORURINE YELLOW 12/24/2023 1330   APPEARANCEUR CLEAR 12/24/2023 1330   LABSPEC 1.019 12/24/2023 1330   PHURINE 5.0 12/24/2023 1330   GLUCOSEU NEGATIVE 12/24/2023 1330   HGBUR SMALL (A) 12/24/2023 1330   BILIRUBINUR NEGATIVE 12/24/2023 1330   KETONESUR NEGATIVE 12/24/2023 1330   PROTEINUR NEGATIVE 12/24/2023 1330   UROBILINOGEN 0.2 09/07/2012 2107   NITRITE NEGATIVE 12/24/2023 1330   LEUKOCYTESUR NEGATIVE 12/24/2023 1330    Sepsis Labs: Lactic Acid, Venous No results  found for: LATICACIDVEN  MICROBIOLOGY: No results found for this or any previous visit (from the past 240 hours).  RADIOLOGY STUDIES/RESULTS: CT HEAD WO CONTRAST ( ) Result Date: 03/26/2024 EXAM: CT HEAD WITHOUT CONTRAST 03/26/2024 07:51:00 AM TECHNIQUE: CT of the head was performed without the administration of intravenous contrast. Automated exposure control, iterative reconstruction, and/or weight based adjustment of the mA/kV was utilized to reduce the radiation dose to as low as reasonably achievable. COMPARISON: Brain MRI and Head CT 03/22/2024. CLINICAL HISTORY: 63 year old female. Known recent acute CVA, new right facial numbness. FINDINGS: BRAIN AND VENTRICLES: No acute hemorrhage. Hypodense cytotoxic edema now visible in the posterior left corona radiata tracking to the lentiform, corresponding to DWI abnormality. No hemorrhagic transformation or mass effect (series 3 image 17). Gray white matter differentiation stable elsewhere. Calcified atherosclerosis at the skull base. No suspicious intracranial vascular hyperdensity. No hydrocephalus. No  extra-axial collection. No mass effect or midline shift. ORBITS: No acute abnormality. SINUSES: Paranasal sinuses remain well aerated. SOFT TISSUES AND SKULL: No acute soft tissue abnormality. No skull fracture. Middle ears and mastoids remain well aerated. IMPRESSION: 1. Expected CT appearance of left corona radiata and lentiform infarct. No hemorrhagic transformation or mass effect. 2. No new intracranial abnormality. Electronically signed by: Helayne Hurst MD 03/26/2024 07:56 AM EST RP Workstation: [id number]     LOS: 5 days   Donalda Applebaum, MD  [facility]    To contact the attending provider between 7A-7P or the covering provider during after hours 7P-7A, please log into the web site [email or web address] and access using universal [place] password for that web site. If you do not have the password, please call the hospital  operator.  03/27/2024, 9:06 AM

## 2024-03-27 NOTE — Progress Notes (Signed)
 Inpatient Rehab Admissions Coordinator:   Per RNCM, pt's spouse may be able to take up to 2 weeks off from work; unfortunately based on the level of assist pt currently needs I think she will need a longer duration of supervision/support at discharge.  I think she is a great rehab patient, but I cannot admit her to [facility] without a plan for longer term caregiver support at discharge.    Reche Lowers, PT, DPT Admissions Coordinator [phone] 03/27/24 12:40 PM

## 2024-03-27 NOTE — TOC Progression Note (Signed)
 Transition of Care [facility]) - Progression Note    Patient Details  Name: Natasha Roach MRN: [id number] Date of Birth: 06/27/1960  [facility] CM/SW Contact  Landry DELENA Senters, RN Phone Number: 03/27/2024, 12:55 PM  Clinical Narrative:     Patient is not approved to go to CIR, due to insufficient support after CIR d/c. CM spoke to patient and S.O. about other AIR options and SNF options, and patient refuses all options.  CM attempted to have this conversation multiple times and patient refuses to consider any options other than CIR.   Patient did tell S.O. to pick her up from the hospital after work to take her home. CM did advise patient against this as she needs further rehab before she is safe to go home and patient still refuses.   CM did notify Dr. Dennise of this. Patient's S.O. is planning to discuss option of SNF with patient when he comes to hospital this afternoon, as he supports her going somewhere for inpatient therapy.  CM will continue to follow.  Expected Discharge Plan: IP Rehab Facility Barriers to Discharge: Continued Medical Work up               Expected Discharge Plan and Services In-house Referral: Clinical Social Work   Post Acute Care Choice: IP Rehab Living arrangements for the past 2 months: Apartment                                       Social Drivers of Health (SDOH) Interventions SDOH Screenings   Food Insecurity: No Food Insecurity (03/22/2024)  Housing: Unknown (03/22/2024)  Transportation Needs: No Transportation Needs (03/22/2024)  Utilities: Not At Risk (03/22/2024)  Depression (PHQ2-9): Medium Risk (05/26/2023)  Financial Resource Strain: Low Risk  (01/17/2018)  Physical Activity: Unknown (01/17/2018)  Social Connections: Unknown (03/22/2024)  Stress: Stress Concern Present (01/17/2018)  Tobacco Use: High Risk (03/22/2024)    Readmission Risk Interventions     No data to display

## 2024-03-27 NOTE — Discharge Summary (Signed)
 PATIENT DETAILS Name: Natasha Roach Age: 63 y.o. Sex: female Date of Birth: [birth year] MRN: [id number]. Admitting Physician: Eva KATHEE Pore, DO ERE:[id number], Zachary, MD  Admit Date: 03/22/2024 Discharge date: 03/28/2024  Recommendations for Outpatient Follow-up:  Follow up with PCP in 1-2 weeks Please obtain CMP/CBC in one week Please ensure follow up with Neurology  Admitted From:  Home  Disposition: Skilled nursing facility   Discharge Condition: good  CODE STATUS:   Code Status: Full Code   Diet recommendation:  Diet Order             Diet - low sodium heart healthy           Diet Carb Modified           Diet Heart Room service appropriate? Yes with Assist; Fluid consistency: Thin  Diet effective now                    Brief Summary: Patient is a 63 y.o.  female with history of CAD-s/p PCI to LAD in 2017, HFrEF with recovered EF, HTN, HLD, cocaine  use, noncompliance to medications-presented with left-sided weakness-found to have acute CVA.   Significant events: 11/28>> admit to [facility].   Significant studies: 11/28>> CT angio head/neck: Mild stenosis distal M1, no LVO, no significant carotid stenosis. 11/28>> MRI brain: Acute infarct left periventricular white matter extending into posterior limb of left internal capsule 11/28>> UDS:+ve cocaine  11/28>> A1c: 6.9 11/29>> LDL: 49 11/29>> echo: EF 60-65%, RV systolic function normal. 12/02>> CT head: Expected CT appearance of left corona radiata/lentiform infarct.   Significant microbiology data: None   Procedures: None   Consults: Neurology  Brief Hospital Course: Acute CVA Thought to be secondary to small vessel disease Workup as above Continues to have right-sided deficits and dysarthria (per patient-dysarthria worse in the morning but improves as the day goes along) Continue aspirin /Plavix /Lipitor  Please ensure patient follow-up with neurology   History of CAD-s/p PCI  2017 No anginal symptoms Aspirin /Plavix /statin Given history of cocaine -bradycardia-not on beta-blocker   Chronic HFrEF with recovered EF Euvolemic   HTN BP currently stable without use of any antihypertensives Continue outpatient monitoring by PCP   HLD Statin   DM-2 CBG stable-SSI while inpatient Start metformin  on discharge.  Cocaine arlyce use Counseled  Discharge Diagnoses:  Principal Problem:   Acute CVA (cerebrovascular accident) [facility])   Discharge Instructions:  Activity:  As tolerated with Full fall precautions use walker/cane & assistance as needed  Discharge Instructions     Ambulatory referral to Neurology   Complete by: As directed    Follow up with stroke clinic NP at [facility] in about 4-6 weeks. Thanks.   Call MD for:  difficulty breathing, headache or visual disturbances   Complete by: As directed    Call MD for:  extreme fatigue   Complete by: As directed    Call MD for:  persistant dizziness or light-headedness   Complete by: As directed    Diet - low sodium heart healthy   Complete by: As directed    Diet Carb Modified   Complete by: As directed    Discharge instructions   Complete by: As directed    Follow with Primary MD  Natasha Zachary, MD in 1-2 weeks  Please get a complete blood count and chemistry panel checked by your Primary MD at your next visit, and again as instructed by your Primary MD.  Get Medicines reviewed and adjusted: Please take all your medications with you for  your next visit with your Primary MD  Laboratory/radiological data: Please request your Primary MD to go over all hospital tests and procedure/radiological results at the follow up, please ask your Primary MD to get all Hospital records sent to his/her office.  In some cases, they will be blood work, cultures and biopsy results pending at the time of your discharge. Please request that your primary care M.D. follows up on these results.  Also Note the  following: If you experience worsening of your admission symptoms, develop shortness of breath, life threatening emergency, suicidal or homicidal thoughts you must seek medical attention immediately by calling 911 or calling your MD immediately  if symptoms less severe.  You must read complete instructions/literature along with all the possible adverse reactions/side effects for all the Medicines you take and that have been prescribed to you. Take any new Medicines after you have completely understood and accpet all the possible adverse reactions/side effects.   Do not drive when taking Pain medications or sleeping medications (Benzodaizepines)  Do not take more than prescribed Pain, Sleep and Anxiety Medications. It is not advisable to combine anxiety,sleep and pain medications without talking with your primary care practitioner  Special Instructions: If you have smoked or chewed Tobacco  in the last 2 yrs please stop smoking, stop any regular Alcohol  and or any Recreational drug use.  Wear Seat belts while driving.  Please note: You were cared for by a hospitalist during your hospital stay. Once you are discharged, your primary care physician will handle any further medical issues. Please note that NO REFILLS for any discharge medications will be authorized once you are discharged, as it is imperative that you return to your primary care physician (or establish a relationship with a primary care physician if you do not have one) for your post hospital discharge needs so that they can reassess your need for medications and monitor your lab values.   Increase activity slowly   Complete by: As directed       Allergies as of 03/28/2024   No Known Allergies      Medication List     TAKE these medications    acetaminophen  500 MG tablet Commonly known as: TYLENOL  Take 1,000 mg by mouth 2 (two) times daily as needed for headache or fever (pain).   aspirin  EC 81 MG tablet Take 1 tablet  (81 mg total) by mouth daily.   atorvastatin  80 MG tablet Commonly known as: LIPITOR  Take 1 tablet (80 mg total) by mouth every evening. What changed: when to take this   Blood Glucose Monitoring Suppl Devi 1 each by Does not apply route in the morning, at noon, and at bedtime. May substitute to any manufacturer covered by patient's insurance.   BLOOD GLUCOSE TEST STRIPS Strp 1 each by In Vitro route in the morning, at noon, and at bedtime. May substitute to any manufacturer covered by patient's insurance.   clopidogrel  75 MG tablet Commonly known as: PLAVIX  Take 1 tablet (75 mg total) by mouth daily.   Combivent Respimat 20-100 MCG/ACT Aers respimat Generic drug: Ipratropium-Albuterol Inhale 1 puff into the lungs every 6 (six) hours as needed for wheezing or shortness of breath.   Lancet Device Misc 1 each by Does not apply route in the morning, at noon, and at bedtime. May substitute to any manufacturer covered by patient's insurance.   Lancets Misc 1 each by Does not apply route as directed. Dispense based on patient and insurance preference. Use  up to four times daily as directed. (FOR ICD-10 E10.9, E11.9).   melatonin 3 MG Tabs tablet Take 1 tablet (3 mg total) by mouth at bedtime as needed (insomnia).   metFORMIN  500 MG tablet Commonly known as: GLUCOPHAGE  Take 1 tablet (500 mg total) by mouth 2 (two) times daily with a meal.   nitroGLYCERIN  0.4 MG SL tablet Commonly known as: NITROSTAT  Place 1 tablet (0.4 mg total) under the tongue every 5 (five) minutes x 3 doses as needed for chest pain.   pantoprazole 40 MG tablet Commonly known as: PROTONIX Take 1 tablet (40 mg total) by mouth daily. Start taking on: March 29, 2024        Follow-up Information     [facility]. Schedule an appointment as soon as possible for a visit in 1 month(s).   Specialty: Neurology Why: stroke clinic Contact information: [address] [place]  [phone]               No Known Allergies   Other Procedures/Studies: CT HEAD WO CONTRAST ( ) Result Date: 03/27/2024 EXAM: CT HEAD WITHOUT CONTRAST 03/27/2024 10:05:49 PM TECHNIQUE: CT of the head was performed without the administration of intravenous contrast. Automated exposure control, iterative reconstruction, and/or weight based adjustment of the mA/kV was utilized to reduce the radiation dose to as low as reasonably achievable. COMPARISON: 03/26/2024 CLINICAL HISTORY: Head trauma, minor (Age >= 65y) FINDINGS: BRAIN AND VENTRICLES: Unchanged appearance of small vessel infarct of the left basal ganglia compared to 03/26/2024. No acute hemorrhage. No evidence of acute infarct. No hydrocephalus. No extra-axial collection. No mass effect or midline shift. ORBITS: No acute abnormality. SINUSES: No acute abnormality. SOFT TISSUES AND SKULL: No acute soft tissue abnormality. No skull fracture. IMPRESSION: 1. No acute intracranial abnormality. 2. Unchanged small vessel infarct of the left basal ganglia. Electronically signed by: Franky Stanford MD 03/27/2024 10:24 PM EST RP Workstation: [id number]   CT HEAD WO CONTRAST ( ) Result Date: 03/26/2024 EXAM: CT HEAD WITHOUT CONTRAST 03/26/2024 07:51:00 AM TECHNIQUE: CT of the head was performed without the administration of intravenous contrast. Automated exposure control, iterative reconstruction, and/or weight based adjustment of the mA/kV was utilized to reduce the radiation dose to as low as reasonably achievable. COMPARISON: Brain MRI and Head CT 03/22/2024. CLINICAL HISTORY: 63 year old female. Known recent acute CVA, new right facial numbness. FINDINGS: BRAIN AND VENTRICLES: No acute hemorrhage. Hypodense cytotoxic edema now visible in the posterior left corona radiata tracking to the lentiform, corresponding to DWI abnormality. No hemorrhagic transformation or mass effect (series 3 image 17). Gray white  matter differentiation stable elsewhere. Calcified atherosclerosis at the skull base. No suspicious intracranial vascular hyperdensity. No hydrocephalus. No extra-axial collection. No mass effect or midline shift. ORBITS: No acute abnormality. SINUSES: Paranasal sinuses remain well aerated. SOFT TISSUES AND SKULL: No acute soft tissue abnormality. No skull fracture. Middle ears and mastoids remain well aerated. IMPRESSION: 1. Expected CT appearance of left corona radiata and lentiform infarct. No hemorrhagic transformation or mass effect. 2. No new intracranial abnormality. Electronically signed by: Helayne Hurst MD 03/26/2024 07:56 AM EST RP Workstation: [id number]   ECHOCARDIOGRAM COMPLETE Result Date: 03/23/2024    ECHOCARDIOGRAM REPORT   Patient Name:   Natasha DELENA Roach Date of Exam: 03/23/2024 Medical Rec #:  [id number]              Height:       67.0 in Accession #:    [id number]  Weight:       171.5 lb Date of Birth:  [birth year]              BSA:          1.894 m Patient Age:    63 years               BP:           157/80 mmHg Patient Gender: F                      HR:           56 bpm. Exam Location:  Inpatient Procedure: 2D Echo, Cardiac Doppler and Color Doppler (Both Spectral and Color            Flow Doppler were utilized during procedure). Indications:    Stroke I63.9  History:        Patient has prior history of Echocardiogram examinations, most                 recent 01/16/2018. Cardiomyopathy, Previous Myocardial                 Infarction, PSA and Stroke; Risk Factors:Current Smoker,                 Hypertension and Dyslipidemia.  Sonographer:    Koleen Popper RDCS Referring Phys: [id number] ALLISON L ELLIS IMPRESSIONS  1. Left ventricular ejection fraction, by estimation, is 60 to 65%. The left ventricle has normal function. The left ventricle has no regional wall motion abnormalities. Left ventricular diastolic parameters were normal.  2. Right ventricular systolic function is normal.  The right ventricular size is normal.  3. The mitral valve is normal in structure. Trivial mitral valve regurgitation. No evidence of mitral stenosis.  4. The aortic valve is normal in structure. Aortic valve regurgitation is not visualized. No aortic stenosis is present.  5. The inferior vena cava is normal in size with greater than 50% respiratory variability, suggesting right atrial pressure of 3 mmHg. Conclusion(s)/Recommendation(s): No intracardiac source of embolism detected on this transthoracic study. Consider a transesophageal echocardiogram to exclude cardiac source of embolism if clinically indicated. FINDINGS  Left Ventricle: Left ventricular ejection fraction, by estimation, is 60 to 65%. The left ventricle has normal function. The left ventricle has no regional wall motion abnormalities. The left ventricular internal cavity size was normal in size. There is  no left ventricular hypertrophy. Left ventricular diastolic parameters were normal. Normal left ventricular filling pressure. Right Ventricle: The right ventricular size is normal. No increase in right ventricular wall thickness. Right ventricular systolic function is normal. Left Atrium: Left atrial size was normal in size. Right Atrium: Right atrial size was normal in size. Pericardium: There is no evidence of pericardial effusion. Mitral Valve: The mitral valve is normal in structure. Trivial mitral valve regurgitation. No evidence of mitral valve stenosis. Tricuspid Valve: The tricuspid valve is normal in structure. Tricuspid valve regurgitation is trivial. No evidence of tricuspid stenosis. Aortic Valve: The aortic valve is normal in structure. Aortic valve regurgitation is not visualized. No aortic stenosis is present. Pulmonic Valve: The pulmonic valve was normal in structure. Pulmonic valve regurgitation is not visualized. No evidence of pulmonic stenosis. Aorta: The aortic root is normal in size and structure. Venous: The inferior vena  cava is normal in size with greater than 50% respiratory variability, suggesting right atrial pressure of 3 mmHg. IAS/Shunts: No atrial level shunt detected by color  flow Doppler.  LEFT VENTRICLE PLAX 2D LVIDd:         3.90 cm      Diastology LVIDs:         2.40 cm      LV e' medial:    7.40 cm/s LV PW:         0.90 cm      LV E/e' medial:  9.6 LV IVS:        1.10 cm      LV e' lateral:   12.50 cm/s LVOT diam:     1.90 cm      LV E/e' lateral: 5.7 LV SV:         56 LV SV Index:   30 LVOT Area:     2.84 cm  LV Volumes (MOD) LV vol d, MOD A2C: 133.0 ml LV vol s, MOD A2C: 40.8 ml LV SV MOD A2C:     92.2 ml RIGHT VENTRICLE             IVC RV Basal diam:  3.80 cm     IVC diam: 1.50 cm RV S prime:     18.10 cm/s TAPSE (M-mode): 2.7 cm LEFT ATRIUM             Index        RIGHT ATRIUM           Index LA diam:        2.80 cm 1.48 cm/m   RA Area:     12.40 cm LA Vol (A2C):   35.6 ml 18.79 ml/m  RA Volume:   27.70 ml  14.62 ml/m LA Vol (A4C):   50.5 ml 26.66 ml/m LA Biplane Vol: 42.1 ml 22.22 ml/m  AORTIC VALVE LVOT Vmax:   110.00 cm/s LVOT Vmean:  66.000 cm/s LVOT VTI:    0.199 m  AORTA Ao Root diam: 3.00 cm MITRAL VALVE MV Area (PHT): 2.69 cm    SHUNTS MV Decel Time: 282 msec    Systemic VTI:  0.20 m MV E velocity: 70.70 cm/s  Systemic Diam: 1.90 cm MV A velocity: 72.80 cm/s MV E/A ratio:  0.97 Wilbert Bihari MD Electronically signed by Wilbert Bihari MD Signature Date/Time: 03/23/2024/2:51:13 PM    Final    MR BRAIN WO CONTRAST Result Date: 03/22/2024 EXAM: MRI BRAIN WITHOUT CONTRAST 03/22/2024 08:19:37 AM TECHNIQUE: Multiplanar multisequence MRI of the head/brain was performed without the administration of intravenous contrast. COMPARISON: CT of the head dated 03/22/2024. CLINICAL HISTORY: Neuro deficit, acute, stroke suspected. FINDINGS: BRAIN AND VENTRICLES: There is restricted diffusion present within the left periventricular white matter extending to the posterior limb of the left internal capsule. It is  consistent with an acute small vessel bland infarct. There is age-related atrophy. Moderate cerebral white matter disease. No intracranial hemorrhage. No mass. No midline shift. No hydrocephalus. The sella is unremarkable. Normal flow voids. ORBITS: No acute abnormality. SINUSES AND MASTOIDS: No acute abnormality. BONES AND SOFT TISSUES: Normal marrow signal. No acute soft tissue abnormality. IMPRESSION: 1. Acute small vessel bland infarct in the left periventricular white matter extending to the posterior limb of the left internal capsule. 2. Moderate cerebral white matter disease. 3. Age-related atrophy. Electronically signed by: Evalene Coho MD 03/22/2024 08:51 AM EST RP Workstation: [id number]   CT ANGIO HEAD NECK W WO CM (CODE STROKE) Result Date: 03/22/2024 EXAM: CTA HEAD AND NECK WITHOUT AND WITH 03/22/2024 04:02:27 AM TECHNIQUE: CTA of the head and neck was performed without and with  the administration of 75 mL of iohexol  (OMNIPAQUE ) 350 MG/ML injection. Multiplanar 2D and/or 3D reformatted images are provided for review. Automated exposure control, iterative reconstruction, and/or weight based adjustment of the mA/kV was utilized to reduce the radiation dose to as low as reasonably achievable. Stenosis of the internal carotid arteries measured using NASCET criteria. COMPARISON: CT of the head dated 03/22/2024 CLINICAL HISTORY: Stroke, follow up FINDINGS: CTA NECK: AORTIC ARCH AND ARCH VESSELS: There is mild calcific atheromatous disease within the aortic arch and origin of the left subclavian artery. There is common origin of the brachiocephalic artery and left common carotid artery. No dissection or arterial injury. No significant stenosis of the brachiocephalic or subclavian arteries. CERVICAL CAROTID ARTERIES: There is mild calcific plaque present within the carotid bulbs, with no associated stenosis. The cervical segments of the internal carotid arteries are tortuous, but normal in caliber. No  dissection, arterial injury, or hemodynamically significant stenosis by NASCET criteria. CERVICAL VERTEBRAL ARTERIES: The vertebral arteries are codominant and normal in caliber throughout their respective courses. No dissection, arterial injury, or significant stenosis. LUNGS AND MEDIASTINUM: Unremarkable. SOFT TISSUES: No acute abnormality. BONES: No acute abnormality. CTA HEAD: ANTERIOR CIRCULATION: There is mild calcific atheromatous disease within the carotid siphons. There is no flow-limiting stenosis. There is mild stenosis within the distal M1 segment of the left middle cerebral artery. No significant stenosis of the anterior cerebral arteries. No aneurysm. POSTERIOR CIRCULATION: There is fetal type origin of the posterior cerebral arteries with a diminutive basilar artery and diminutive P1 segments. No significant stenosis of the posterior cerebral arteries. No significant stenosis of the basilar artery. No significant stenosis of the vertebral arteries. No aneurysm. OTHER: No dural venous sinus thrombosis on this non-dedicated study. No large vessel occlusion or flow-limiting stenosis. The above findings were communicated to Dr. Voncile at 4:14 am 03/22/2024. IMPRESSION: 1. Mild stenosis within the distal M1 segment of the left middle cerebral artery; no large vessel occlusion, hemodynamically significant stenosis, or aneurysm in the head or neck 2. Atherosclerotic calcifications involving the aortic arch, carotid bulbs, and carotid siphons without flow-limiting stenosis; tortuous but normal-caliber cervical internal carotid arteries; codominant vertebral arteries 3. Anatomic variants including common origin of the brachiocephalic and left common carotid arteries and fetal-type origin of the posterior cerebral arteries with diminutive basilar artery and P1 segments 4. Findings communicated to Dr. Arora at 4:14 am on 03/22/2024 Electronically signed by: Evalene Coho MD 03/22/2024 04:15 AM EST RP  Workstation: [id number]   CT HEAD CODE STROKE WO CONTRAST (LKW 0-4.5h, LVO 0-24h) Result Date: 03/22/2024 CLINICAL DATA:  Code stroke. Initial evaluation for acute neuro deficit, stroke suspected, right-sided weakness. EXAM: CT HEAD WITHOUT CONTRAST TECHNIQUE: Contiguous axial images were obtained from the base of the skull through the vertex without intravenous contrast. RADIATION DOSE REDUCTION: This exam was performed according to the departmental dose-optimization program which includes automated exposure control, adjustment of the mA and/or kV according to patient size and/or use of iterative reconstruction technique. COMPARISON:  CT from 04/20/2007. FINDINGS: Brain: Cerebral volume within normal limits. No acute intracranial hemorrhage. No acute large vessel territory infarct. No mass lesion, midline shift or mass effect. No hydrocephalus or extra-axial fluid collection. Vascular: No abnormal hyperdense vessel. Scattered vascular calcifications noted within the carotid siphons. Skull: Scalp soft tissues demonstrate no acute finding. Calvarium intact. Sinuses/Orbits: Globes orbital soft tissues within normal limits. Paranasal sinuses are largely clear. No mastoid effusion. Other: None. ASPECTS [facility] Stroke Program Early CT Score) - Ganglionic level infarction (  caudate, lentiform nuclei, internal capsule, insula, M1-M3 cortex): 7 - Supraganglionic infarction (M4-M6 cortex): 3 Total score (0-10 with 10 being normal): 10 IMPRESSION: 1. Negative head CT.  No acute intracranial abnormality. 2. ASPECTS is 10. These results were communicated to Dr. Arora at 3:57 am on 03/22/2024 by text page via the [facility] messaging system. Electronically Signed   By: Morene Hoard M.D.   On: 03/22/2024 03:59     TODAY-DAY OF DISCHARGE:  Subjective:   Natasha Roach today has no headache,no chest abdominal pain,no new weakness tingling or numbness, feels much better wants to go home today.   Objective:    Blood pressure 134/73, pulse 62, temperature 98.4 F (36.9 C), temperature source Oral, resp. rate 19, height 5' 7 (1.702 m), weight 80.4 kg, SpO2 98%.  Intake/Output Summary (Last 24 hours) at 03/28/2024 0956 Last data filed at 03/28/2024 0006 Gross per 24 hour  Intake --  Output 400 ml  Net -400 ml   Filed Weights   03/26/24 0500 03/27/24 0500 03/28/24 0458  Weight: 81.7 kg 79 kg 80.4 kg    Exam: Awake Alert, Oriented *3, No new F.N deficits, Normal affect [place].AT,PERRAL Supple Neck,No JVD, No cervical lymphadenopathy appriciated.  Symmetrical Chest wall movement, Good air movement bilaterally, CTAB RRR,No Gallops,Rubs or new Murmurs, No Parasternal Heave +ve B.Sounds, Abd Soft, Non tender, No organomegaly appriciated, No rebound -guarding or rigidity. No Cyanosis, Clubbing or edema, No new Rash or bruise   PERTINENT RADIOLOGIC STUDIES: CT HEAD WO CONTRAST ( ) Result Date: 03/27/2024 EXAM: CT HEAD WITHOUT CONTRAST 03/27/2024 10:05:49 PM TECHNIQUE: CT of the head was performed without the administration of intravenous contrast. Automated exposure control, iterative reconstruction, and/or weight based adjustment of the mA/kV was utilized to reduce the radiation dose to as low as reasonably achievable. COMPARISON: 03/26/2024 CLINICAL HISTORY: Head trauma, minor (Age >= 65y) FINDINGS: BRAIN AND VENTRICLES: Unchanged appearance of small vessel infarct of the left basal ganglia compared to 03/26/2024. No acute hemorrhage. No evidence of acute infarct. No hydrocephalus. No extra-axial collection. No mass effect or midline shift. ORBITS: No acute abnormality. SINUSES: No acute abnormality. SOFT TISSUES AND SKULL: No acute soft tissue abnormality. No skull fracture. IMPRESSION: 1. No acute intracranial abnormality. 2. Unchanged small vessel infarct of the left basal ganglia. Electronically signed by: Franky Stanford MD 03/27/2024 10:24 PM EST RP Workstation: [id number]     PERTINENT LAB  RESULTS: CBC: No results for input(s): WBC, HGB, HCT, PLT in the last 72 hours. CMET CMP     Component Value Date/Time   NA 139 03/24/2024 0347   NA 144 06/18/2018 1144   K 4.2 03/24/2024 0347   CL 106 03/24/2024 0347   CO2 22 03/24/2024 0347   GLUCOSE 137 (H) 03/24/2024 0347   BUN 14 03/24/2024 0347   BUN 10 06/18/2018 1144   CREATININE 0.76 03/24/2024 0347   CALCIUM  9.0 03/24/2024 0347   PROT 5.7 (L) 03/22/2024 0339   PROT 5.8 (L) 06/18/2018 1144   ALBUMIN 3.5 03/22/2024 0339   ALBUMIN 4.2 06/18/2018 1144   AST 19 03/22/2024 0339   ALT 19 03/22/2024 0339   ALKPHOS 99 03/22/2024 0339   BILITOT 0.4 03/22/2024 0339   BILITOT 0.4 06/18/2018 1144   GFRNONAA >60 03/24/2024 0347    GFR Estimated Creatinine Clearance: 79.5 mL/min (by C-G formula based on SCr of 0.76 mg/dL). No results for input(s): LIPASE, AMYLASE in the last 72 hours. No results for input(s): CKTOTAL, CKMB, CKMBINDEX, TROPONINI in the last 72  hours. Invalid input(s): POCBNP No results for input(s): DDIMER in the last 72 hours. No results for input(s): HGBA1C in the last 72 hours. No results for input(s): CHOL, HDL, LDLCALC, TRIG, CHOLHDL, LDLDIRECT in the last 72 hours. No results for input(s): TSH, T4TOTAL, T3FREE, THYROIDAB in the last 72 hours.  Invalid input(s): FREET3 No results for input(s): VITAMINB12, FOLATE, FERRITIN, TIBC, IRON, RETICCTPCT in the last 72 hours. Coags: No results for input(s): INR in the last 72 hours.  Invalid input(s): PT Microbiology: No results found for this or any previous visit (from the past 240 hours).  FURTHER DISCHARGE INSTRUCTIONS:  Get Medicines reviewed and adjusted: Please take all your medications with you for your next visit with your Primary MD  Laboratory/radiological data: Please request your Primary MD to go over all hospital tests and procedure/radiological results at the follow up, please  ask your Primary MD to get all Hospital records sent to his/her office.  In some cases, they will be blood work, cultures and biopsy results pending at the time of your discharge. Please request that your primary care M.D. goes through all the records of your hospital data and follows up on these results.  Also Note the following: If you experience worsening of your admission symptoms, develop shortness of breath, life threatening emergency, suicidal or homicidal thoughts you must seek medical attention immediately by calling 911 or calling your MD immediately  if symptoms less severe.  You must read complete instructions/literature along with all the possible adverse reactions/side effects for all the Medicines you take and that have been prescribed to you. Take any new Medicines after you have completely understood and accpet all the possible adverse reactions/side effects.   Do not drive when taking Pain medications or sleeping medications (Benzodaizepines)  Do not take more than prescribed Pain, Sleep and Anxiety Medications. It is not advisable to combine anxiety,sleep and pain medications without talking with your primary care practitioner  Special Instructions: If you have smoked or chewed Tobacco  in the last 2 yrs please stop smoking, stop any regular Alcohol  and or any Recreational drug use.  Wear Seat belts while driving.  Please note: You were cared for by a hospitalist during your hospital stay. Once you are discharged, your primary care physician will handle any further medical issues. Please note that NO REFILLS for any discharge medications will be authorized once you are discharged, as it is imperative that you return to your primary care physician (or establish a relationship with a primary care physician if you do not have one) for your post hospital discharge needs so that they can reassess your need for medications and monitor your lab values.  Total Time spent coordinating  discharge including counseling, education and face to face time equals greater than 30 minutes.  SignedBETHA Donalda Applebaum 03/28/2024 9:56 AM

## 2024-03-28 ENCOUNTER — Other Ambulatory Visit (HOSPITAL_COMMUNITY): Payer: Self-pay

## 2024-03-28 DIAGNOSIS — F1410 Cocaine abuse, uncomplicated: Secondary | ICD-10-CM | POA: Diagnosis not present

## 2024-03-28 DIAGNOSIS — I639 Cerebral infarction, unspecified: Secondary | ICD-10-CM | POA: Diagnosis not present

## 2024-03-28 DIAGNOSIS — E785 Hyperlipidemia, unspecified: Secondary | ICD-10-CM | POA: Diagnosis not present

## 2024-03-28 MED ORDER — BLOOD GLUCOSE MONITOR SYSTEM W/DEVICE KIT
1.0000 | PACK | Freq: Three times a day (TID) | 0 refills | Status: AC
  Filled 2024-03-28: qty 1, 30d supply, fill #0

## 2024-03-28 MED ORDER — ACCU-CHEK SOFTCLIX LANCETS MISC
1.0000 | 0 refills | Status: AC
  Filled 2024-03-28: qty 100, 30d supply, fill #0

## 2024-03-28 MED ORDER — LANCET DEVICE MISC
1.0000 | Freq: Three times a day (TID) | 0 refills | Status: AC
  Filled 2024-03-28: qty 1, 30d supply, fill #0

## 2024-03-28 MED ORDER — PANTOPRAZOLE SODIUM 40 MG PO TBEC: 40.0000 mg | DELAYED_RELEASE_TABLET | Freq: Every day | ORAL | Status: AC

## 2024-03-28 MED ORDER — QUETIAPINE FUMARATE 25 MG PO TABS
25.0000 mg | ORAL_TABLET | Freq: Every day | ORAL | Status: DC | PRN
  Administered 2024-03-29: 25 mg via ORAL
  Filled 2024-03-28: qty 1

## 2024-03-28 MED ORDER — BLOOD GLUCOSE TEST VI STRP
1.0000 | ORAL_STRIP | Freq: Three times a day (TID) | 0 refills | Status: AC
  Filled 2024-03-28: qty 100, 30d supply, fill #0

## 2024-03-28 MED ORDER — MELATONIN 3 MG PO TABS: 3.0000 mg | ORAL_TABLET | Freq: Every evening | ORAL | Status: AC | PRN

## 2024-03-28 MED ORDER — METFORMIN HCL 500 MG PO TABS
500.0000 mg | ORAL_TABLET | Freq: Two times a day (BID) | ORAL | 2 refills | Status: AC
  Filled 2024-03-28: qty 60, 30d supply, fill #0

## 2024-03-28 NOTE — TOC Progression Note (Signed)
 Transition of Care [facility]) - Progression Note    Patient Details  Name: LUANNE KRZYZANOWSKI MRN: [id number] Date of Birth: 1960-10-23  [facility]) CM/SW Contact  Landry DELENA Senters, RN Phone Number: 03/28/2024, 3:50 PM  Clinical Narrative:     CM spoke with patient again today regarding rehab at d/c. At this time, patient is open to referral for IR and chooses [facility].  Referral sent to [facility] and insurance shara is pending.  CM will continue to follow.   Expected Discharge Plan: IP Rehab Facility Barriers to Discharge: Continued Medical Work up               Expected Discharge Plan and Services In-house Referral: Clinical Social Work   Post Acute Care Choice: IP Rehab Living arrangements for the past 2 months: Apartment                                       Social Drivers of Health (SDOH) Interventions SDOH Screenings   Food Insecurity: No Food Insecurity (03/22/2024)  Housing: Unknown (03/22/2024)  Transportation Needs: No Transportation Needs (03/22/2024)  Utilities: Not At Risk (03/22/2024)  Depression (PHQ2-9): Medium Risk (05/26/2023)  Financial Resource Strain: Low Risk  (01/17/2018)  Physical Activity: Unknown (01/17/2018)  Social Connections: Unknown (03/22/2024)  Stress: Stress Concern Present (01/17/2018)  Tobacco Use: High Risk (03/22/2024)    Readmission Risk Interventions     No data to display

## 2024-03-28 NOTE — Progress Notes (Signed)
 PROGRESS NOTE        PATIENT DETAILS Name: Natasha Roach Age: 63 y.o. Sex: female Date of Birth: [birth year] Admit Date: 03/22/2024 Admitting Physician Eva KATHEE Pore, DO ERE:[id number], Zachary, MD  Brief Summary: Patient is a 63 y.o.  female with history of CAD-s/p PCI to LAD in 2017, HFrEF with recovered EF, HTN, HLD, cocaine  use, noncompliance to medications-presented with left-sided weakness-found to have acute CVA.  Significant events: 11/28>> admit to [facility].  Significant studies: 11/28>> CT angio head/neck: Mild stenosis distal M1, no LVO, no significant carotid stenosis. 11/28>> MRI brain: Acute infarct left periventricular white matter extending into posterior limb of left internal capsule 11/28>> UDS:+ve cocaine  11/28>> A1c: 6.9 11/29>> LDL: 49 11/29>> echo: EF 60-65%, RV systolic function normal. 12/02>> CT head: Expected CT appearance of left corona radiata/lentiform infarct.  Significant microbiology data: None  Procedures: None  Consults: Neurology  Subjective: No new issues overnight-had a mechanical fall yesterday-repeat CT head was negative.  She is now agreeable to go to either inpatient rehab or SNF-but prefers that this be in [place].  Objective: Vitals: Blood pressure 134/73, pulse 62, temperature 98.4 F (36.9 C), temperature source Oral, resp. rate 19, height 5' 7 (1.702 m), weight 80.4 kg, SpO2 98%.   Exam: Awake/alert Slight right facial droop Right-sided hemiparesis persists-worse on RUE than RLE   Pertinent Labs/Radiology:    Latest Ref Rng & Units 03/24/2024    3:47 AM 03/22/2024    3:42 AM 03/22/2024    3:39 AM  CBC  WBC 4.0 - 10.5 K/uL 12.0   11.5   Hemoglobin 12.0 - 15.0 g/dL 86.1  85.6  86.6   Hematocrit 36.0 - 46.0 % 43.5  42.0  43.7   Platelets 150 - 400 K/uL 226   234     Lab Results  Component Value Date   NA 139 03/24/2024   K 4.2 03/24/2024   CL 106 03/24/2024   CO2 22  03/24/2024      Assessment/Plan: Acute CVA Thought to be secondary to small vessel disease Workup as above Continues to have right-sided deficits and dysarthria (per patient-dysarthria worse in the morning but improves as the day goes along) Continue aspirin /Plavix /Lipitor  SNF versus acute inpatient rehab being sought-TOC team following  Mechanical fall on 12/3 Repeat CT head negative No obvious injuries Fall precautions.  History of CAD-s/p PCI 2017 No anginal symptoms Aspirin /Plavix /statin Given history of cocaine -bradycardia-not on beta-blocker  Chronic HFrEF with recovered EF Euvolemic  HTN BP currently stable without use of any antihypertensives  HLD Statin  DM-2 CBG stable-SSI  Recent Labs    03/26/24 0726  GLUCAP 127*    Cocaine /tobacco use Counseled  Code status:   Code Status: Full Code   DVT Prophylaxis: enoxaparin  (LOVENOX ) injection 40 mg Start: 03/22/24 1000 SCDs Start: 03/22/24 0509    Family Communication: None at bedside   Disposition Plan: Status is: Inpatient Remains inpatient appropriate because: Severity of illness   Planned Discharge Destination:Rehabilitation facility   Diet: Diet Order             Diet - low sodium heart healthy           Diet Carb Modified           Diet Heart Room service appropriate? Yes with Assist; Fluid consistency: Thin  Diet effective now  Antimicrobial agents: Anti-infectives (From admission, onward)    None        MEDICATIONS: Scheduled Meds:  aspirin  EC  81 mg Oral Daily   atorvastatin   80 mg Oral QPM   clopidogrel   75 mg Oral Daily   enoxaparin  (LOVENOX ) injection  40 mg Subcutaneous Daily   pantoprazole  40 mg Oral Daily   Continuous Infusions: PRN Meds:.acetaminophen  **OR** acetaminophen , ipratropium-albuterol, LORazepam , melatonin, nitroGLYCERIN , ondansetron  (ZOFRAN ) IV   I have personally reviewed following labs and imaging studies  LABORATORY  DATA: CBC: Recent Labs  Lab 03/22/24 0339 03/22/24 0342 03/24/24 0347  WBC 11.5*  --  12.0*  NEUTROABS 6.6  --   --   HGB 13.3 14.3 13.8  HCT 43.7 42.0 43.5  MCV 86.2  --  83.8  PLT 234  --  226    Basic Metabolic Panel: Recent Labs  Lab 03/22/24 0339 03/22/24 0342 03/24/24 0347  NA 143 145 139  K 4.1 4.1 4.2  CL 111 110 106  CO2 22  --  22  GLUCOSE 122* 117* 137*  BUN 16 18 14   CREATININE 0.69 0.70 0.76  CALCIUM  8.8*  --  9.0  MG 2.1  --   --     GFR: Estimated Creatinine Clearance: 79.5 mL/min (by C-G formula based on SCr of 0.76 mg/dL).  Liver Function Tests: Recent Labs  Lab 03/22/24 0339  AST 19  ALT 19  ALKPHOS 99  BILITOT 0.4  PROT 5.7*  ALBUMIN 3.5   No results for input(s): LIPASE, AMYLASE in the last 168 hours. No results for input(s): AMMONIA in the last 168 hours.  Coagulation Profile: Recent Labs  Lab 03/22/24 0339  INR 0.9    Cardiac Enzymes: No results for input(s): CKTOTAL, CKMB, CKMBINDEX, TROPONINI in the last 168 hours.  BNP (last 3 results) No results for input(s): PROBNP in the last 8760 hours.  Lipid Profile: No results for input(s): CHOL, HDL, LDLCALC, TRIG, CHOLHDL, LDLDIRECT in the last 72 hours.  Thyroid Function Tests: No results for input(s): TSH, T4TOTAL, FREET4, T3FREE, THYROIDAB in the last 72 hours.  Anemia Panel: No results for input(s): VITAMINB12, FOLATE, FERRITIN, TIBC, IRON, RETICCTPCT in the last 72 hours.  Urine analysis:    Component Value Date/Time   COLORURINE YELLOW 12/24/2023 1330   APPEARANCEUR CLEAR 12/24/2023 1330   LABSPEC 1.019 12/24/2023 1330   PHURINE 5.0 12/24/2023 1330   GLUCOSEU NEGATIVE 12/24/2023 1330   HGBUR SMALL (A) 12/24/2023 1330   BILIRUBINUR NEGATIVE 12/24/2023 1330   KETONESUR NEGATIVE 12/24/2023 1330   PROTEINUR NEGATIVE 12/24/2023 1330   UROBILINOGEN 0.2 09/07/2012 2107   NITRITE NEGATIVE 12/24/2023 1330   LEUKOCYTESUR  NEGATIVE 12/24/2023 1330    Sepsis Labs: Lactic Acid, Venous No results found for: LATICACIDVEN  MICROBIOLOGY: No results found for this or any previous visit (from the past 240 hours).  RADIOLOGY STUDIES/RESULTS: CT HEAD WO CONTRAST ( ) Result Date: 03/27/2024 EXAM: CT HEAD WITHOUT CONTRAST 03/27/2024 10:05:49 PM TECHNIQUE: CT of the head was performed without the administration of intravenous contrast. Automated exposure control, iterative reconstruction, and/or weight based adjustment of the mA/kV was utilized to reduce the radiation dose to as low as reasonably achievable. COMPARISON: 03/26/2024 CLINICAL HISTORY: Head trauma, minor (Age >= 65y) FINDINGS: BRAIN AND VENTRICLES: Unchanged appearance of small vessel infarct of the left basal ganglia compared to 03/26/2024. No acute hemorrhage. No evidence of acute infarct. No hydrocephalus. No extra-axial collection. No mass effect or midline shift. ORBITS: No acute abnormality.  SINUSES: No acute abnormality. SOFT TISSUES AND SKULL: No acute soft tissue abnormality. No skull fracture. IMPRESSION: 1. No acute intracranial abnormality. 2. Unchanged small vessel infarct of the left basal ganglia. Electronically signed by: Franky Stanford MD 03/27/2024 10:24 PM EST RP Workstation: [id number]     LOS: 6 days   Donalda Applebaum, MD  [facility]    To contact the attending provider between 7A-7P or the covering provider during after hours 7P-7A, please log into the web site [email or web address] and access using universal [place] password for that web site. If you do not have the password, please call the hospital operator.  03/28/2024, 9:57 AM

## 2024-03-28 NOTE — Progress Notes (Signed)
 Physical Therapy Treatment Patient Details Name: Natasha Roach MRN: [id number] DOB: November 17, 1960 Today's Date: 03/28/2024   History of Present Illness 63 yo female presenting 11/28 with R-sided weakness and slurred speech. (+) cocaine , NIH: 7, MRI showed: acute small vessel bland infarct in the left periventricular white matter extending to the posterior limb of the left internal capsule. Moderate cerebral white matter disease.    PT Comments  Pt received side lying in bed. Session focused on progressing ambulation, exercises, and trialing most appropriate DME. Pt continues to demonstrate difficulty sequencing positions needed for bed mobility but improves with single step cuing. Hemi walker trialed today for standing mobility due to R UE paresis. Pt has difficulty maintaining all 4 legs on the ground with transfer to chair and was deemed not safe for ambulation. Pt demonstrates good carryover of cues provided yesterday to increase step width and benefits from support of railing in hallway in addition to assist x2. Manual assist provided to R knee to prevent buckling and hyperextension in weight bearing. Two seated rest breaks taken throughout session.  Pt would benefit from continued PT services focused on ROM, strengthening, balance, and gait training to further promote functional mobility.     If plan is discharge home, recommend the following: A lot of help with walking and/or transfers;A lot of help with bathing/dressing/bathroom;Assist for transportation;Supervision due to cognitive status;Help with stairs or ramp for entrance   Can travel by private vehicle        Equipment Recommendations   (Hemi walker? pending progress)    Recommendations for Other Services       Precautions / Restrictions Precautions Precautions: None Restrictions Weight Bearing Restrictions Per Provider Order: No     Mobility  Bed Mobility Overal bed mobility: Needs Assistance Bed Mobility: Supine to  Sit Rolling: Min assist Sidelying to sit: Min assist       General bed mobility comments: Cues required to sequence positioning to complete modified sidelying position due to R UE weakness. Light trunk assist required to sit.    Transfers Overall transfer level: Needs assistance Equipment used: Hemi-walker Transfers: Sit to/from Stand, Bed to chair/wheelchair/BSC Sit to Stand: Mod assist   Step pivot transfers: Mod assist, +2 physical assistance       General transfer comment: Assist needed to steady upon standing. Assist x2 required for step pivot due due balance defects and positioning of R LE.    Ambulation/Gait Ambulation/Gait assistance: Mod assist, +2 physical assistance Gait Distance (Feet): 30 Feet Assistive device: Hemi-walker (L sided hallway railing) Gait Pattern/deviations: Step-to pattern, Decreased step length - right, Decreased step length - left, Decreased stance time - right, Decreased stride length, Decreased dorsiflexion - right, Decreased weight shift to right, Knees buckling, Knee hyperextension - right, Trunk flexed, Narrow base of support   Gait velocity interpretation: <1.31 ft/sec, indicative of household ambulator   General Gait Details: Pt with poor eccentric control of R knee with ambulation, leading to either buckling or hyperextension which requires physical assist to prevent. Hemi walker trialed, did not prove enough stability for patient and returned to utilizing hallway railing.   Stairs             Wheelchair Mobility     Tilt Bed    Modified Rankin (Stroke Patients Only) Modified Rankin (Stroke Patients Only) Pre-Morbid Rankin Score: No symptoms Modified Rankin: Moderate disability     Balance Overall balance assessment: Needs assistance Sitting-balance support: Single extremity supported (R UE hangs by her side when  seated or standing) Sitting balance-Leahy Scale: Fair Sitting balance - Comments: Pt impulsive with trunk  movements while seated EOB and in recliner.   Standing balance support: Single extremity supported (R UE unsupported by her side) Standing balance-Leahy Scale: Poor                              Communication Communication Communication: No apparent difficulties  Cognition Arousal: Alert Behavior During Therapy: Restless, Anxious, Impulsive, Lability   PT - Cognitive impairments: Attention, Initiation, Sequencing, Problem solving, Awareness, Safety/Judgement                       PT - Cognition Comments: Pt easily frustrated by mobility status, require cues to redirect to treatment task Following commands: Intact Following commands impaired: Follows one step commands inconsistently, Follows one step commands with increased time    Cueing Cueing Techniques: Verbal cues, Tactile cues, Visual cues  Exercises General Exercises - Upper Extremity Shoulder Flexion: Self ROM, 5 reps, Right Shoulder Extension: AROM, 5 reps, Right (Performed within very limited range due to weakness) Elbow Flexion: 10 reps, Self ROM, Right General Exercises - Lower Extremity Long Arc Quad: AROM, 5 reps (5 reps also completed AAROM)    General Comments General comments (skin integrity, edema, etc.): VSS throughout, IV and telemetry intact. Bruising noted on R upper arm from patient frequently leaning against bed rail.      Pertinent Vitals/Pain Pain Assessment Pain Assessment: No/denies pain    Home Living                          Prior Function            PT Goals (current goals can now be found in the care plan section) Progress towards PT goals: Progressing toward goals    Frequency    Min 4X/week      PT Plan      Co-evaluation     PT goals addressed during session: Mobility/safety with mobility;Balance;Proper use of DME;Strengthening/ROM        AM-PAC PT 6 Clicks Mobility   Outcome Measure  Help needed turning from your back to your side  while in a flat bed without using bedrails?: A Little Help needed moving from lying on your back to sitting on the side of a flat bed without using bedrails?: A Little Help needed moving to and from a bed to a chair (including a wheelchair)?: Total Help needed standing up from a chair using your arms (e.g., wheelchair or bedside chair)?: A Lot Help needed to walk in hospital room?: Total Help needed climbing 3-5 steps with a railing? : Total 6 Click Score: 11    End of Session Equipment Utilized During Treatment: Gait belt Activity Tolerance: Patient tolerated treatment well Patient left: in chair;with call bell/phone within reach;with chair alarm set Nurse Communication: Mobility status PT Visit Diagnosis: Unsteadiness on feet (R26.81);Other abnormalities of gait and mobility (R26.89);Muscle weakness (generalized) (M62.81) Hemiplegia - Right/Left: Right Hemiplegia - dominant/non-dominant: Dominant Hemiplegia - caused by: Cerebral infarction     Time: 8476-8393 PT Time Calculation (min) (ACUTE ONLY): 43 min  Charges:    $Gait Training: 23-37 mins $Therapeutic Exercise: 8-22 mins PT General Charges $$ ACUTE PT VISIT: 1 Visit                     Sabra Morel, PT, DPT  Acute Rehabilitation Services         Office: [phone]      Sabra MARLA Morel 03/28/2024, 4:38 PM

## 2024-03-28 NOTE — Plan of Care (Signed)
  Problem: Coping: Goal: Ability to adjust to condition or change in health will improve Outcome: Progressing   Problem: Health Behavior/Discharge Planning: Goal: Ability to manage health-related needs will improve Outcome: Progressing   Problem: Education: Goal: Knowledge of disease or condition will improve Outcome: Progressing Goal: Knowledge of secondary prevention will improve (MUST DOCUMENT ALL) Outcome: Progressing   Problem: Coping: Goal: Will verbalize positive feelings about self Outcome: Progressing   Problem: Coping: Goal: Level of anxiety will decrease Outcome: Progressing

## 2024-03-28 NOTE — Progress Notes (Signed)
 Patient laying uncomfortably on the recliner primary nurse attempted to reposition her but she doesn't want to,she is well aware that she is high risk for fall.Is constantly asking to go out for smoking.Secure chat sent to MD

## 2024-03-28 NOTE — Progress Notes (Signed)
 Primary RN checked on the patient after hearing the chair alarm,patient was on the floor.Per her husband who was sitting beside her,she slided down from the recliner and gently tap her head on the floor.With the help of RNs and NT patient repositioned back to bed,Vitals stable,patient is alert and oriented*4.MD made aware

## 2024-03-28 NOTE — Plan of Care (Signed)
  Problem: Coping: Goal: Will verbalize positive feelings about self Outcome: Progressing Goal: Will identify appropriate support needs Outcome: Progressing   Problem: Nutrition: Goal: Risk of aspiration will decrease Outcome: Progressing

## 2024-03-29 ENCOUNTER — Other Ambulatory Visit (HOSPITAL_COMMUNITY): Payer: Self-pay

## 2024-03-29 DIAGNOSIS — F1410 Cocaine abuse, uncomplicated: Secondary | ICD-10-CM | POA: Diagnosis not present

## 2024-03-29 DIAGNOSIS — I639 Cerebral infarction, unspecified: Secondary | ICD-10-CM | POA: Diagnosis not present

## 2024-03-29 DIAGNOSIS — E1169 Type 2 diabetes mellitus with other specified complication: Secondary | ICD-10-CM

## 2024-03-29 DIAGNOSIS — I10 Essential (primary) hypertension: Secondary | ICD-10-CM | POA: Diagnosis not present

## 2024-03-29 LAB — CREATININE, SERUM
Creatinine, Ser: 0.74 mg/dL (ref 0.44–1.00)
GFR, Estimated: >60 mL/min (ref >60)

## 2024-03-29 MED ORDER — NICOTINE 21 MG/24HR TD PT24: 21.0000 mg | MEDICATED_PATCH | Freq: Every day | TRANSDERMAL | Status: AC

## 2024-03-29 MED ORDER — NICOTINE 21 MG/24HR TD PT24
21.0000 mg | MEDICATED_PATCH | Freq: Every day | TRANSDERMAL | Status: DC
  Administered 2024-03-29: 21 mg via TRANSDERMAL
  Filled 2024-03-29: qty 1

## 2024-03-29 NOTE — Progress Notes (Signed)
 Patient refused to pick up diabetic supplies from out patient pharmacy. Patient is going to discharge to a Rehab facility.

## 2024-03-29 NOTE — Discharge Summary (Signed)
 PATIENT DETAILS Name: Natasha Roach Age: 63 y.o. Sex: female Date of Birth: 1960/07/19 MRN: [id number]. Admitting Physician: Natasha KATHEE Pore, DO ERE:[id number], Zachary, MD  Admit Date: 03/22/2024 Discharge date: 03/29/2024  Recommendations for Outpatient Follow-up:  Follow up with PCP in 1-2 weeks Please obtain CMP/CBC in one week Please ensure follow up with Neurology  Admitted From:  Home  Disposition: Skilled nursing facility   Discharge Condition: good  CODE STATUS:   Code Status: Full Code   Diet recommendation:  Diet Order             Diet - low sodium heart healthy           Diet Carb Modified           Diet Heart Room service appropriate? Yes with Assist; Fluid consistency: Thin  Diet effective now                    Brief Summary: Patient is a 63 y.o.  female with history of CAD-s/p PCI to LAD in 2017, HFrEF with recovered EF, HTN, HLD, cocaine  use, noncompliance to medications-presented with left-sided weakness-found to have acute CVA.   Significant events: 11/28>> admit to [facility].   Significant studies: 11/28>> CT angio head/neck: Mild stenosis distal M1, no LVO, no significant carotid stenosis. 11/28>> MRI brain: Acute infarct left periventricular white matter extending into posterior limb of left internal capsule 11/28>> UDS:+ve cocaine  11/28>> A1c: 6.9 11/29>> LDL: 49 11/29>> echo: EF 60-65%, RV systolic function normal. 12/02>> CT head: Expected CT appearance of left corona radiata/lentiform infarct.   Significant microbiology data: None   Procedures: None   Consults: Neurology  Brief Hospital Course: Acute CVA Thought to be secondary to small vessel disease Workup as above Continues to have right-sided deficits and dysarthria (per patient-dysarthria worse in the morning but improves as the day goes along) Continue aspirin /Plavix /Lipitor  Please ensure patient follow-up with neurology   History of CAD-s/p PCI  2017 No anginal symptoms Aspirin /Plavix /statin Given history of cocaine -bradycardia-not on beta-blocker   Chronic HFrEF with recovered EF Euvolemic   HTN BP currently stable without use of any antihypertensives Continue outpatient monitoring by PCP   HLD Statin   DM-2 CBG stable-SSI while inpatient Start metformin  on discharge.  Cocaine arlyce use Counseled  Discharge Diagnoses:  Principal Problem:   Acute CVA (cerebrovascular accident) [facility])   Discharge Instructions:  Activity:  As tolerated with Full fall precautions use walker/cane & assistance as needed  Discharge Instructions     Ambulatory referral to Neurology   Complete by: As directed    Follow up with stroke clinic NP at [facility] in about 4-6 weeks. Thanks.   Call MD for:  difficulty breathing, headache or visual disturbances   Complete by: As directed    Call MD for:  extreme fatigue   Complete by: As directed    Call MD for:  persistant dizziness or light-headedness   Complete by: As directed    Diet - low sodium heart healthy   Complete by: As directed    Diet Carb Modified   Complete by: As directed    Discharge instructions   Complete by: As directed    Follow with Primary MD  Catalina Zachary, MD in 1-2 weeks  Please get a complete blood count and chemistry panel checked by your Primary MD at your next visit, and again as instructed by your Primary MD.  Get Medicines reviewed and adjusted: Please take all your medications with you for  your next visit with your Primary MD  Laboratory/radiological data: Please request your Primary MD to go over all hospital tests and procedure/radiological results at the follow up, please ask your Primary MD to get all Hospital records sent to his/her office.  In some cases, they will be blood work, cultures and biopsy results pending at the time of your discharge. Please request that your primary care M.D. follows up on these results.  Also Note the  following: If you experience worsening of your admission symptoms, develop shortness of breath, life threatening emergency, suicidal or homicidal thoughts you must seek medical attention immediately by calling 911 or calling your MD immediately  if symptoms less severe.  You must read complete instructions/literature along with all the possible adverse reactions/side effects for all the Medicines you take and that have been prescribed to you. Take any new Medicines after you have completely understood and accpet all the possible adverse reactions/side effects.   Do not drive when taking Pain medications or sleeping medications (Benzodaizepines)  Do not take more than prescribed Pain, Sleep and Anxiety Medications. It is not advisable to combine anxiety,sleep and pain medications without talking with your primary care practitioner  Special Instructions: If you have smoked or chewed Tobacco  in the last 2 yrs please stop smoking, stop any regular Alcohol  and or any Recreational drug use.  Wear Seat belts while driving.  Please note: You were cared for by a hospitalist during your hospital stay. Once you are discharged, your primary care physician will handle any further medical issues. Please note that NO REFILLS for any discharge medications will be authorized once you are discharged, as it is imperative that you return to your primary care physician (or establish a relationship with a primary care physician if you do not have one) for your post hospital discharge needs so that they can reassess your need for medications and monitor your lab values.   Increase activity slowly   Complete by: As directed       Allergies as of 03/29/2024   No Known Allergies      Medication List     TAKE these medications    Accu-Chek Softclix Lancets lancets Use up to four times daily as directed. (FOR ICD-10 E10.9, E11.9).   acetaminophen  500 MG tablet Commonly known as: TYLENOL  Take 1,000 mg by  mouth 2 (two) times daily as needed for headache or fever (pain).   aspirin  EC 81 MG tablet Take 1 tablet (81 mg total) by mouth daily.   atorvastatin  80 MG tablet Commonly known as: LIPITOR  Take 1 tablet (80 mg total) by mouth every evening. What changed: when to take this   Blood Glucose Monitor System w/Device Kit Use in the morning, at noon, and at bedtime. May substitute to any manufacturer covered by patient's insurance.   BLOOD GLUCOSE TEST STRIPS Strp Use in the morning, at noon, and at bedtime. May substitute to any manufacturer covered by patient's insurance.   clopidogrel  75 MG tablet Commonly known as: PLAVIX  Take 1 tablet (75 mg total) by mouth daily.   Combivent Respimat 20-100 MCG/ACT Aers respimat Generic drug: Ipratropium-Albuterol  Inhale 1 puff into the lungs every 6 (six) hours as needed for wheezing or shortness of breath.   Lancet Device Misc 1 each by Does not apply route in the morning, at noon, and at bedtime. May substitute to any manufacturer covered by patient's insurance.   melatonin 3 MG Tabs tablet Take 1 tablet (3 mg total)  by mouth at bedtime as needed (insomnia).   metFORMIN  500 MG tablet Commonly known as: GLUCOPHAGE  Take 1 tablet (500 mg total) by mouth 2 (two) times daily with a meal.   nicotine  21 mg/24hr patch Commonly known as: NICODERM CQ  - dosed in mg/24 hours Place 1 patch (21 mg total) onto the skin daily.   nitroGLYCERIN  0.4 MG SL tablet Commonly known as: NITROSTAT  Place 1 tablet (0.4 mg total) under the tongue every 5 (five) minutes x 3 doses as needed for chest pain.   pantoprazole  40 MG tablet Commonly known as: PROTONIX  Take 1 tablet (40 mg total) by mouth daily.        Follow-up Information     [facility]. Schedule an appointment as soon as possible for a visit in 1 month(s).   Specialty: Neurology Why: stroke clinic Contact information: [address] [place]  [place] [address] [phone]        Catalina Bare, MD. Schedule an appointment as soon as possible for a visit in 1 week(s).   Specialty: Internal Medicine Contact information: [address] [place] [address] [phone]                No Known Allergies   Other Procedures/Studies: CT HEAD WO CONTRAST ( ) Result Date: 03/27/2024 EXAM: CT HEAD WITHOUT CONTRAST 03/27/2024 10:05:49 PM TECHNIQUE: CT of the head was performed without the administration of intravenous contrast. Automated exposure control, iterative reconstruction, and/or weight based adjustment of the mA/kV was utilized to reduce the radiation dose to as low as reasonably achievable. COMPARISON: 03/26/2024 CLINICAL HISTORY: Head trauma, minor (Age >= 65y) FINDINGS: BRAIN AND VENTRICLES: Unchanged appearance of small vessel infarct of the left basal ganglia compared to 03/26/2024. No acute hemorrhage. No evidence of acute infarct. No hydrocephalus. No extra-axial collection. No mass effect or midline shift. ORBITS: No acute abnormality. SINUSES: No acute abnormality. SOFT TISSUES AND SKULL: No acute soft tissue abnormality. No skull fracture. IMPRESSION: 1. No acute intracranial abnormality. 2. Unchanged small vessel infarct of the left basal ganglia. Electronically signed by: Franky Stanford MD 03/27/2024 10:24 PM EST RP Workstation: [id number]   CT HEAD WO CONTRAST ( ) Result Date: 03/26/2024 EXAM: CT HEAD WITHOUT CONTRAST 03/26/2024 07:51:00 AM TECHNIQUE: CT of the head was performed without the administration of intravenous contrast. Automated exposure control, iterative reconstruction, and/or weight based adjustment of the mA/kV was utilized to reduce the radiation dose to as low as reasonably achievable. COMPARISON: Brain MRI and Head CT 03/22/2024. CLINICAL HISTORY: 63 year old female. Known recent acute CVA, new right facial numbness. FINDINGS: BRAIN AND VENTRICLES: No acute hemorrhage. Hypodense cytotoxic  edema now visible in the posterior left corona radiata tracking to the lentiform, corresponding to DWI abnormality. No hemorrhagic transformation or mass effect (series 3 image 17). Gray white matter differentiation stable elsewhere. Calcified atherosclerosis at the skull base. No suspicious intracranial vascular hyperdensity. No hydrocephalus. No extra-axial collection. No mass effect or midline shift. ORBITS: No acute abnormality. SINUSES: Paranasal sinuses remain well aerated. SOFT TISSUES AND SKULL: No acute soft tissue abnormality. No skull fracture. Middle ears and mastoids remain well aerated. IMPRESSION: 1. Expected CT appearance of left corona radiata and lentiform infarct. No hemorrhagic transformation or mass effect. 2. No new intracranial abnormality. Electronically signed by: Helayne Hurst MD 03/26/2024 07:56 AM EST RP Workstation: [id number]   ECHOCARDIOGRAM COMPLETE Result Date: 03/23/2024    ECHOCARDIOGRAM REPORT   Patient Name:   Natasha DELENA Roach Date of Exam: 03/23/2024  Medical Rec #:  [id number]              Height:       67.0 in Accession #:    [id number]             Weight:       171.5 lb Date of Birth:  1960/11/04              BSA:          1.894 m Patient Age:    63 years               BP:           157/80 mmHg Patient Gender: F                      HR:           56 bpm. Exam Location:  Inpatient Procedure: 2D Echo, Cardiac Doppler and Color Doppler (Both Spectral and Color            Flow Doppler were utilized during procedure). Indications:    Stroke I63.9  History:        Patient has prior history of Echocardiogram examinations, most                 recent 01/16/2018. Cardiomyopathy, Previous Myocardial                 Infarction, PSA and Stroke; Risk Factors:Current Smoker,                 Hypertension and Dyslipidemia.  Sonographer:    Koleen Popper RDCS Referring Phys: [id number] ALLISON L ELLIS IMPRESSIONS  1. Left ventricular ejection fraction, by estimation, is 60 to 65%. The left  ventricle has normal function. The left ventricle has no regional wall motion abnormalities. Left ventricular diastolic parameters were normal.  2. Right ventricular systolic function is normal. The right ventricular size is normal.  3. The mitral valve is normal in structure. Trivial mitral valve regurgitation. No evidence of mitral stenosis.  4. The aortic valve is normal in structure. Aortic valve regurgitation is not visualized. No aortic stenosis is present.  5. The inferior vena cava is normal in size with greater than 50% respiratory variability, suggesting right atrial pressure of 3 mmHg. Conclusion(s)/Recommendation(s): No intracardiac source of embolism detected on this transthoracic study. Consider a transesophageal echocardiogram to exclude cardiac source of embolism if clinically indicated. FINDINGS  Left Ventricle: Left ventricular ejection fraction, by estimation, is 60 to 65%. The left ventricle has normal function. The left ventricle has no regional wall motion abnormalities. The left ventricular internal cavity size was normal in size. There is  no left ventricular hypertrophy. Left ventricular diastolic parameters were normal. Normal left ventricular filling pressure. Right Ventricle: The right ventricular size is normal. No increase in right ventricular wall thickness. Right ventricular systolic function is normal. Left Atrium: Left atrial size was normal in size. Right Atrium: Right atrial size was normal in size. Pericardium: There is no evidence of pericardial effusion. Mitral Valve: The mitral valve is normal in structure. Trivial mitral valve regurgitation. No evidence of mitral valve stenosis. Tricuspid Valve: The tricuspid valve is normal in structure. Tricuspid valve regurgitation is trivial. No evidence of tricuspid stenosis. Aortic Valve: The aortic valve is normal in structure. Aortic valve regurgitation is not visualized. No aortic stenosis is present. Pulmonic Valve: The pulmonic  valve was normal in structure. Pulmonic valve regurgitation is not visualized.  No evidence of pulmonic stenosis. Aorta: The aortic root is normal in size and structure. Venous: The inferior vena cava is normal in size with greater than 50% respiratory variability, suggesting right atrial pressure of 3 mmHg. IAS/Shunts: No atrial level shunt detected by color flow Doppler.  LEFT VENTRICLE PLAX 2D LVIDd:         3.90 cm      Diastology LVIDs:         2.40 cm      LV e' medial:    7.40 cm/s LV PW:         0.90 cm      LV E/e' medial:  9.6 LV IVS:        1.10 cm      LV e' lateral:   12.50 cm/s LVOT diam:     1.90 cm      LV E/e' lateral: 5.7 LV SV:         56 LV SV Index:   30 LVOT Area:     2.84 cm  LV Volumes (MOD) LV vol d, MOD A2C: 133.0 ml LV vol s, MOD A2C: 40.8 ml LV SV MOD A2C:     92.2 ml RIGHT VENTRICLE             IVC RV Basal diam:  3.80 cm     IVC diam: 1.50 cm RV S prime:     18.10 cm/s TAPSE (M-mode): 2.7 cm LEFT ATRIUM             Index        RIGHT ATRIUM           Index LA diam:        2.80 cm 1.48 cm/m   RA Area:     12.40 cm LA Vol (A2C):   35.6 ml 18.79 ml/m  RA Volume:   27.70 ml  14.62 ml/m LA Vol (A4C):   50.5 ml 26.66 ml/m LA Biplane Vol: 42.1 ml 22.22 ml/m  AORTIC VALVE LVOT Vmax:   110.00 cm/s LVOT Vmean:  66.000 cm/s LVOT VTI:    0.199 m  AORTA Ao Root diam: 3.00 cm MITRAL VALVE MV Area (PHT): 2.69 cm    SHUNTS MV Decel Time: 282 msec    Systemic VTI:  0.20 m MV E velocity: 70.70 cm/s  Systemic Diam: 1.90 cm MV A velocity: 72.80 cm/s MV E/A ratio:  0.97 Wilbert Bihari MD Electronically signed by Wilbert Bihari MD Signature Date/Time: 03/23/2024/2:51:13 PM    Final    MR BRAIN WO CONTRAST Result Date: 03/22/2024 EXAM: MRI BRAIN WITHOUT CONTRAST 03/22/2024 08:19:37 AM TECHNIQUE: Multiplanar multisequence MRI of the head/brain was performed without the administration of intravenous contrast. COMPARISON: CT of the head dated 03/22/2024. CLINICAL HISTORY: Neuro deficit, acute, stroke  suspected. FINDINGS: BRAIN AND VENTRICLES: There is restricted diffusion present within the left periventricular white matter extending to the posterior limb of the left internal capsule. It is consistent with an acute small vessel bland infarct. There is age-related atrophy. Moderate cerebral white matter disease. No intracranial hemorrhage. No mass. No midline shift. No hydrocephalus. The sella is unremarkable. Normal flow voids. ORBITS: No acute abnormality. SINUSES AND MASTOIDS: No acute abnormality. BONES AND SOFT TISSUES: Normal marrow signal. No acute soft tissue abnormality. IMPRESSION: 1. Acute small vessel bland infarct in the left periventricular white matter extending to the posterior limb of the left internal capsule. 2. Moderate cerebral white matter disease. 3. Age-related atrophy. Electronically signed by: Evalene Coho  MD 03/22/2024 08:51 AM EST RP Workstation: [id number]   CT ANGIO HEAD NECK W WO CM (CODE STROKE) Result Date: 03/22/2024 EXAM: CTA HEAD AND NECK WITHOUT AND WITH 03/22/2024 04:02:27 AM TECHNIQUE: CTA of the head and neck was performed without and with the administration of 75 mL of iohexol  (OMNIPAQUE ) 350 MG/ML injection. Multiplanar 2D and/or 3D reformatted images are provided for review. Automated exposure control, iterative reconstruction, and/or weight based adjustment of the mA/kV was utilized to reduce the radiation dose to as low as reasonably achievable. Stenosis of the internal carotid arteries measured using NASCET criteria. COMPARISON: CT of the head dated 03/22/2024 CLINICAL HISTORY: Stroke, follow up FINDINGS: CTA NECK: AORTIC ARCH AND ARCH VESSELS: There is mild calcific atheromatous disease within the aortic arch and origin of the left subclavian artery. There is common origin of the brachiocephalic artery and left common carotid artery. No dissection or arterial injury. No significant stenosis of the brachiocephalic or subclavian arteries. CERVICAL CAROTID  ARTERIES: There is mild calcific plaque present within the carotid bulbs, with no associated stenosis. The cervical segments of the internal carotid arteries are tortuous, but normal in caliber. No dissection, arterial injury, or hemodynamically significant stenosis by NASCET criteria. CERVICAL VERTEBRAL ARTERIES: The vertebral arteries are codominant and normal in caliber throughout their respective courses. No dissection, arterial injury, or significant stenosis. LUNGS AND MEDIASTINUM: Unremarkable. SOFT TISSUES: No acute abnormality. BONES: No acute abnormality. CTA HEAD: ANTERIOR CIRCULATION: There is mild calcific atheromatous disease within the carotid siphons. There is no flow-limiting stenosis. There is mild stenosis within the distal M1 segment of the left middle cerebral artery. No significant stenosis of the anterior cerebral arteries. No aneurysm. POSTERIOR CIRCULATION: There is fetal type origin of the posterior cerebral arteries with a diminutive basilar artery and diminutive P1 segments. No significant stenosis of the posterior cerebral arteries. No significant stenosis of the basilar artery. No significant stenosis of the vertebral arteries. No aneurysm. OTHER: No dural venous sinus thrombosis on this non-dedicated study. No large vessel occlusion or flow-limiting stenosis. The above findings were communicated to Dr. Voncile at 4:14 am 03/22/2024. IMPRESSION: 1. Mild stenosis within the distal M1 segment of the left middle cerebral artery; no large vessel occlusion, hemodynamically significant stenosis, or aneurysm in the head or neck 2. Atherosclerotic calcifications involving the aortic arch, carotid bulbs, and carotid siphons without flow-limiting stenosis; tortuous but normal-caliber cervical internal carotid arteries; codominant vertebral arteries 3. Anatomic variants including common origin of the brachiocephalic and left common carotid arteries and fetal-type origin of the posterior cerebral  arteries with diminutive basilar artery and P1 segments 4. Findings communicated to Dr. Arora at 4:14 am on 03/22/2024 Electronically signed by: Evalene Coho MD 03/22/2024 04:15 AM EST RP Workstation: [id number]   CT HEAD CODE STROKE WO CONTRAST (LKW 0-4.5h, LVO 0-24h) Result Date: 03/22/2024 CLINICAL DATA:  Code stroke. Initial evaluation for acute neuro deficit, stroke suspected, right-sided weakness. EXAM: CT HEAD WITHOUT CONTRAST TECHNIQUE: Contiguous axial images were obtained from the base of the skull through the vertex without intravenous contrast. RADIATION DOSE REDUCTION: This exam was performed according to the departmental dose-optimization program which includes automated exposure control, adjustment of the mA and/or kV according to patient size and/or use of iterative reconstruction technique. COMPARISON:  CT from 04/20/2007. FINDINGS: Brain: Cerebral volume within normal limits. No acute intracranial hemorrhage. No acute large vessel territory infarct. No mass lesion, midline shift or mass effect. No hydrocephalus or extra-axial fluid collection. Vascular: No abnormal hyperdense vessel. Scattered vascular  calcifications noted within the carotid siphons. Skull: Scalp soft tissues demonstrate no acute finding. Calvarium intact. Sinuses/Orbits: Globes orbital soft tissues within normal limits. Paranasal sinuses are largely clear. No mastoid effusion. Other: None. ASPECTS [facility] Stroke Program Early CT Score) - Ganglionic level infarction (caudate, lentiform nuclei, internal capsule, insula, M1-M3 cortex): 7 - Supraganglionic infarction (M4-M6 cortex): 3 Total score (0-10 with 10 being normal): 10 IMPRESSION: 1. Negative head CT.  No acute intracranial abnormality. 2. ASPECTS is 10. These results were communicated to Dr. Arora at 3:57 am on 03/22/2024 by text page via the [facility] messaging system. Electronically Signed   By: Morene Hoard M.D.   On: 03/22/2024 03:59     TODAY-DAY OF  DISCHARGE:  Subjective:   Natasha Roach today has no headache,no chest abdominal pain,no new weakness tingling or numbness, feels much better wants to go home today.   Objective:   Blood pressure 109/73, pulse 64, temperature 98 F (36.7 C), temperature source Oral, resp. rate 19, height 5' 7 (1.702 m), weight 80.4 kg, SpO2 90%.  Intake/Output Summary (Last 24 hours) at 03/29/2024 0802 Last data filed at 03/28/2024 1400 Gross per 24 hour  Intake --  Output 700 ml  Net -700 ml   Filed Weights   03/26/24 0500 03/27/24 0500 03/28/24 0458  Weight: 81.7 kg 79 kg 80.4 kg    Exam: Awake Alert, Oriented *3, No new F.N deficits, Normal affect [place].AT,PERRAL Supple Neck,No JVD, No cervical lymphadenopathy appriciated.  Symmetrical Chest wall movement, Good air movement bilaterally, CTAB RRR,No Gallops,Rubs or new Murmurs, No Parasternal Heave +ve B.Sounds, Abd Soft, Non tender, No organomegaly appriciated, No rebound -guarding or rigidity. No Cyanosis, Clubbing or edema, No new Rash or bruise   PERTINENT RADIOLOGIC STUDIES: CT HEAD WO CONTRAST ( ) Result Date: 03/27/2024 EXAM: CT HEAD WITHOUT CONTRAST 03/27/2024 10:05:49 PM TECHNIQUE: CT of the head was performed without the administration of intravenous contrast. Automated exposure control, iterative reconstruction, and/or weight based adjustment of the mA/kV was utilized to reduce the radiation dose to as low as reasonably achievable. COMPARISON: 03/26/2024 CLINICAL HISTORY: Head trauma, minor (Age >= 65y) FINDINGS: BRAIN AND VENTRICLES: Unchanged appearance of small vessel infarct of the left basal ganglia compared to 03/26/2024. No acute hemorrhage. No evidence of acute infarct. No hydrocephalus. No extra-axial collection. No mass effect or midline shift. ORBITS: No acute abnormality. SINUSES: No acute abnormality. SOFT TISSUES AND SKULL: No acute soft tissue abnormality. No skull fracture. IMPRESSION: 1. No acute intracranial  abnormality. 2. Unchanged small vessel infarct of the left basal ganglia. Electronically signed by: Franky Stanford MD 03/27/2024 10:24 PM EST RP Workstation: [id number]     PERTINENT LAB RESULTS: CBC: No results for input(s): WBC, HGB, HCT, PLT in the last 72 hours. CMET CMP     Component Value Date/Time   NA 139 03/24/2024 0347   NA 144 06/18/2018 1144   K 4.2 03/24/2024 0347   CL 106 03/24/2024 0347   CO2 22 03/24/2024 0347   GLUCOSE 137 (H) 03/24/2024 0347   BUN 14 03/24/2024 0347   BUN 10 06/18/2018 1144   CREATININE 0.74 03/29/2024 0438   CALCIUM  9.0 03/24/2024 0347   PROT 5.7 (L) 03/22/2024 0339   PROT 5.8 (L) 06/18/2018 1144   ALBUMIN 3.5 03/22/2024 0339   ALBUMIN 4.2 06/18/2018 1144   AST 19 03/22/2024 0339   ALT 19 03/22/2024 0339   ALKPHOS 99 03/22/2024 0339   BILITOT 0.4 03/22/2024 0339   BILITOT 0.4 06/18/2018 1144  GFRNONAA >60 03/29/2024 0438    GFR Estimated Creatinine Clearance: 79.5 mL/min (by C-G formula based on SCr of 0.74 mg/dL). No results for input(s): LIPASE, AMYLASE in the last 72 hours. No results for input(s): CKTOTAL, CKMB, CKMBINDEX, TROPONINI in the last 72 hours. Invalid input(s): POCBNP No results for input(s): DDIMER in the last 72 hours. No results for input(s): HGBA1C in the last 72 hours. No results for input(s): CHOL, HDL, LDLCALC, TRIG, CHOLHDL, LDLDIRECT in the last 72 hours. No results for input(s): TSH, T4TOTAL, T3FREE, THYROIDAB in the last 72 hours.  Invalid input(s): FREET3 No results for input(s): VITAMINB12, FOLATE, FERRITIN, TIBC, IRON, RETICCTPCT in the last 72 hours. Coags: No results for input(s): INR in the last 72 hours.  Invalid input(s): PT Microbiology: No results found for this or any previous visit (from the past 240 hours).  FURTHER DISCHARGE INSTRUCTIONS:  Get Medicines reviewed and adjusted: Please take all your medications with you for your  next visit with your Primary MD  Laboratory/radiological data: Please request your Primary MD to go over all hospital tests and procedure/radiological results at the follow up, please ask your Primary MD to get all Hospital records sent to his/her office.  In some cases, they will be blood work, cultures and biopsy results pending at the time of your discharge. Please request that your primary care M.D. goes through all the records of your hospital data and follows up on these results.  Also Note the following: If you experience worsening of your admission symptoms, develop shortness of breath, life threatening emergency, suicidal or homicidal thoughts you must seek medical attention immediately by calling 911 or calling your MD immediately  if symptoms less severe.  You must read complete instructions/literature along with all the possible adverse reactions/side effects for all the Medicines you take and that have been prescribed to you. Take any new Medicines after you have completely understood and accpet all the possible adverse reactions/side effects.   Do not drive when taking Pain medications or sleeping medications (Benzodaizepines)  Do not take more than prescribed Pain, Sleep and Anxiety Medications. It is not advisable to combine anxiety,sleep and pain medications without talking with your primary care practitioner  Special Instructions: If you have smoked or chewed Tobacco  in the last 2 yrs please stop smoking, stop any regular Alcohol  and or any Recreational drug use.  Wear Seat belts while driving.  Please note: You were cared for by a hospitalist during your hospital stay. Once you are discharged, your primary care physician will handle any further medical issues. Please note that NO REFILLS for any discharge medications will be authorized once you are discharged, as it is imperative that you return to your primary care physician (or establish a relationship with a primary care  physician if you do not have one) for your post hospital discharge needs so that they can reassess your need for medications and monitor your lab values.  Total Time spent coordinating discharge including counseling, education and face to face time equals greater than 30 minutes.  Signed: Ethelyn Cerniglia 03/29/2024 8:02 AM

## 2024-03-29 NOTE — TOC Transition Note (Signed)
 Transition of Care [facility]) - Discharge Note   Patient Details  Name: Natasha Roach MRN: [id number] Date of Birth: May 26, 1960  [facility]) CM/SW Contact:  Landry DELENA Senters, RN Phone Number: 03/29/2024, 9:42 AM   Clinical Narrative:    Patient will be discharging to [facility] today, with [facility] providing transportation.   CM did speak with Delon from [facility] to confirm patient transportation today  Report: [phone]  CM did provide Bedside nurse with number for report.  CM did inform family and patient of plan that are in patient room. CM did call S.O. [name] with update on transfer and left HIPAA compliant VM for callback.  No further needs identified by CM.   Final next level of care: IP Rehab Facility Barriers to Discharge: No Barriers Identified   Patient Goals and CMS Choice Patient states their goals for this hospitalization and ongoing recovery are:: Achieve IPR CMS Medicare.gov Compare Post Acute Care list provided to:: Patient Choice offered to / list presented to : Patient  ownership interest in [facility].provided to:: Parent NA    Discharge Placement                       Discharge Plan and Services Additional resources added to the After Visit Summary for   In-house Referral: Clinical Social Work   Post Acute Care Choice: IP Rehab                               Social Drivers of Health (SDOH) Interventions SDOH Screenings   Food Insecurity: No Food Insecurity (03/22/2024)  Housing: Unknown (03/22/2024)  Transportation Needs: No Transportation Needs (03/22/2024)  Utilities: Not At Risk (03/22/2024)  Depression (PHQ2-9): Medium Risk (05/26/2023)  Financial Resource Strain: Low Risk  (01/17/2018)  Physical Activity: Unknown (01/17/2018)  Social Connections: Unknown (03/22/2024)  Stress: Stress Concern Present (01/17/2018)  Tobacco Use: High Risk (03/22/2024)     Readmission Risk  Interventions     No data to display

## 2024-03-29 NOTE — Plan of Care (Signed)
   Problem: Fluid Volume: Goal: Ability to maintain a balanced intake and output will improve Outcome: Progressing   Problem: Nutritional: Goal: Maintenance of adequate nutrition will improve Outcome: Progressing

## 2024-04-01 DIAGNOSIS — R2689 Other abnormalities of gait and mobility: Secondary | ICD-10-CM | POA: Diagnosis not present

## 2024-04-01 DIAGNOSIS — I10 Essential (primary) hypertension: Secondary | ICD-10-CM | POA: Diagnosis not present

## 2024-04-01 DIAGNOSIS — M6281 Muscle weakness (generalized): Secondary | ICD-10-CM | POA: Diagnosis not present

## 2024-04-02 DIAGNOSIS — I10 Essential (primary) hypertension: Secondary | ICD-10-CM | POA: Diagnosis not present

## 2024-04-02 DIAGNOSIS — E785 Hyperlipidemia, unspecified: Secondary | ICD-10-CM | POA: Diagnosis not present

## 2024-04-02 DIAGNOSIS — M6281 Muscle weakness (generalized): Secondary | ICD-10-CM | POA: Diagnosis not present

## 2024-04-03 DIAGNOSIS — I10 Essential (primary) hypertension: Secondary | ICD-10-CM | POA: Diagnosis not present

## 2024-04-03 DIAGNOSIS — M6281 Muscle weakness (generalized): Secondary | ICD-10-CM | POA: Diagnosis not present

## 2024-04-03 DIAGNOSIS — E785 Hyperlipidemia, unspecified: Secondary | ICD-10-CM | POA: Diagnosis not present

## 2024-04-04 DIAGNOSIS — E785 Hyperlipidemia, unspecified: Secondary | ICD-10-CM | POA: Diagnosis not present

## 2024-04-04 DIAGNOSIS — I10 Essential (primary) hypertension: Secondary | ICD-10-CM | POA: Diagnosis not present

## 2024-04-04 DIAGNOSIS — R2689 Other abnormalities of gait and mobility: Secondary | ICD-10-CM | POA: Diagnosis not present

## 2024-04-04 DIAGNOSIS — M6281 Muscle weakness (generalized): Secondary | ICD-10-CM | POA: Diagnosis not present

## 2024-04-05 DIAGNOSIS — I10 Essential (primary) hypertension: Secondary | ICD-10-CM | POA: Diagnosis not present

## 2024-04-05 DIAGNOSIS — E785 Hyperlipidemia, unspecified: Secondary | ICD-10-CM | POA: Diagnosis not present

## 2024-04-05 DIAGNOSIS — M6281 Muscle weakness (generalized): Secondary | ICD-10-CM | POA: Diagnosis not present

## 2024-04-06 DIAGNOSIS — I10 Essential (primary) hypertension: Secondary | ICD-10-CM | POA: Diagnosis not present

## 2024-04-06 DIAGNOSIS — E785 Hyperlipidemia, unspecified: Secondary | ICD-10-CM | POA: Diagnosis not present

## 2024-04-06 DIAGNOSIS — M6281 Muscle weakness (generalized): Secondary | ICD-10-CM | POA: Diagnosis not present

## 2024-04-06 DIAGNOSIS — R2689 Other abnormalities of gait and mobility: Secondary | ICD-10-CM | POA: Diagnosis not present

## 2024-04-07 DIAGNOSIS — E785 Hyperlipidemia, unspecified: Secondary | ICD-10-CM | POA: Diagnosis not present

## 2024-04-07 DIAGNOSIS — I10 Essential (primary) hypertension: Secondary | ICD-10-CM | POA: Diagnosis not present

## 2024-04-07 DIAGNOSIS — M6281 Muscle weakness (generalized): Secondary | ICD-10-CM | POA: Diagnosis not present

## 2024-04-08 DIAGNOSIS — I10 Essential (primary) hypertension: Secondary | ICD-10-CM | POA: Diagnosis not present

## 2024-04-08 DIAGNOSIS — E785 Hyperlipidemia, unspecified: Secondary | ICD-10-CM | POA: Diagnosis not present

## 2024-04-08 DIAGNOSIS — M6281 Muscle weakness (generalized): Secondary | ICD-10-CM | POA: Diagnosis not present

## 2024-04-09 DIAGNOSIS — E785 Hyperlipidemia, unspecified: Secondary | ICD-10-CM | POA: Diagnosis not present

## 2024-04-09 DIAGNOSIS — M6281 Muscle weakness (generalized): Secondary | ICD-10-CM | POA: Diagnosis not present

## 2024-04-09 DIAGNOSIS — I10 Essential (primary) hypertension: Secondary | ICD-10-CM | POA: Diagnosis not present

## 2024-04-19 ENCOUNTER — Other Ambulatory Visit (HOSPITAL_COMMUNITY): Payer: Self-pay

## 2024-05-03 NOTE — Progress Notes (Signed)
 [facility] Worker Note Stroke Post Discharge Follow-Up  Natasha Roach [id number]   Contact Type:  Telephone Encounter Date: 05/02/2024  Outreach Project:  Stroke post discharge follow-up Managed Medicaid Plan Participant:   PCP: Yes - See Care Teams in patient chart Payor Status: Does the patient have health insurance (Y/N): Yes Payor Name: : [facility]    Community Health Worker Documentation     Row Name 05/03/24 1030     Post-Stroke CHW Follow-Up Telephone Call   Discharge Location [facility]   Program RN notified of new or worsening symptoms N/A   Little interest or pleasure in doing things Not at all   Feeling down, depressed, or hopeless (PHQ Adolescent also includes...irritable) Not at all   PHQ-2 Total Score 0   PHQ2-9 >4 Program RN notified of new or worsening symptoms. N/A     Functional Questionnaire - ADL's   Bathing Dependent   Dressing Dependent   Meal Prep Independent  Patient preps her own meals but still requires some assistance   Eating Independent   Maintaining Continence Independent   Ambulation/Transferring Dependent  Patient uses wheelchair and walker   Medication Management Dependent  Patient does not have enough strength to open pill bottles so significant other preps medications the night before   Does the patient have support for these ADL's Yes  Patient states significant other helps her     Post-Discharge Instructions Reviewed   Did the patient receive and understand the discharge instructions provided? Yes   Did the patient obtain their post-discharge medications? Yes   Does the patient have any questions related to side effects, education/health literacy? No     Follow-Up Appointments Review   PCP Hospital F/U appointment scheduled? Yes   Did the patient keep PCP discharge appointment? Yes   Specialist Hospital F/U appointment scheduled? Yes   Did the patient keep the specialist appointment Yes   Did the patient have  referral(s) to PT/OT/SLP post discharge? OT;PT   Has the patient been given appointments for the referrals? Yes   Did the patient keep their PT/OT/SLP appointment(s)? Yes  Patients appts are coming up she has not attended them yet     Risk Factor Follow-Up   Is the pateint diabetic? No   Does the patient have hypertension? No   Does patient smoke, use tobacco product, and/or vape?  Yes   Does the patient want information about smoking and/or tobacco use/vaping cessation? No   Does the patient have nutritional needs/restrictions? No   Does the patient exercise IF recommended by the discharge care team? Yes   Can the patient verbalize understanding of the actions needed to manage their current condition? Yes   Can the patient verbalize actions needed to address his/her current risk factors to prevent another stroke?  Yes   Would the patient like helping finding additional community resources and/or stroke support groups? Yes   Additional Resourc Assistance Given: Housing resources       Past Medical History:  Diagnosis Date   CAD (coronary artery disease) 04/23/2016   a. 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 b. STEMI 03/2016 due to cocaine , occ mLAD s/p Synergy stent, EF 40% by cath, 55% by echo.   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 at time of STEMI 03/2016.   Social History   Substance and Sexual Activity  Alcohol Use No   Social History   Substance and Sexual Activity  Drug Use Yes   Types: Cocaine    Comment: last use 01/16/18   Tobacco Use History[1]  SDOH Screenings   Food Insecurity: No Food  Insecurity (03/22/2024)  Housing: Unknown (03/22/2024)  Transportation Needs: No Transportation Needs (03/22/2024)  Utilities: Not At Risk (03/22/2024)  Depression (PHQ2-9): Low Risk (05/03/2024)  Social Connections: Unknown (03/22/2024)  Tobacco Use: High Risk (03/22/2024)   Referrals (if applicable):        Education:  N/A  Limiting Factors:   N/A  Follow-up:   Initial follow up  Follow-up Type:   Telephone  Per health equity protocol additional f/u to be scheduled   Logun Colavito A Andru Genter         [1]  Social History Tobacco Use  Smoking Status Every Day   Current packs/day: 1.00   Average packs/day: 1 pack/day for 40.0 years (40.0 ttl pk-yrs)   Types: Cigarettes  Smokeless Tobacco Never  Tobacco Comments   1-1.5 PPD

## 2024-05-04 NOTE — Therapy (Incomplete)
 " OUTPATIENT PHYSICAL THERAPY NEURO EVALUATION   Patient Name: Natasha Roach MRN: [id number] DOB:1960-11-15, 64 y.o., female Today's Date: 05/04/2024   PCP: Catalina Bare, MD REFERRING PROVIDER: Lenetta Dover, MD  END OF SESSION:   Past Medical History:  Diagnosis Date   CAD (coronary artery disease) 04/23/2016   a. 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 b. STEMI 03/2016 due to cocaine , occ mLAD s/p Synergy stent, EF 40% by cath, 55% by echo.   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 at time of STEMI 03/2016.   Past Surgical History:  Procedure Laterality Date   ABDOMINAL HYSTERECTOMY     BREAST SURGERY     abcess   CARDIAC CATHETERIZATION  2004   CARDIAC CATHETERIZATION N/A 04/21/2016   Procedure: Left Heart Cath and Coronary Angiography;  Surgeon: Debby DELENA Sor, MD;  Location: [facility] INVASIVE CV LAB;  Service: Cardiovascular;  Laterality: N/A;   CARDIAC CATHETERIZATION N/A 04/21/2016   Procedure: Coronary Stent Intervention;  Surgeon: Debby DELENA Sor, MD;  Location: [facility] INVASIVE CV LAB;  Service: Cardiovascular;  Laterality: N/A;   CORONARY STENT INTERVENTION N/A 10/27/2017   Procedure: CORONARY STENT INTERVENTION;  Surgeon: Jordan, Peter M, MD;  Location: [facility] INVASIVE CV LAB;  Service: Cardiovascular;  Laterality: N/A;   CORONARY/GRAFT ACUTE MI REVASCULARIZATION N/A 10/27/2017   Procedure: Coronary/Graft Acute MI Revascularization;  Surgeon: Jordan, Peter M, MD;  Location: [facility] INVASIVE CV LAB;  Service: Cardiovascular;  Laterality: N/A;   CORONARY/GRAFT ACUTE MI REVASCULARIZATION N/A 01/16/2018   Procedure:  Coronary/Graft Acute MI Revascularization;  Surgeon: Jordan, Peter M, MD;  Location: [facility] INVASIVE CV LAB;  Service: Cardiovascular;  Laterality: N/A;   ELECTROPHYSIOLOGIC STUDY N/A 04/21/2016   Procedure: Cardioversion;  Surgeon: Debby DELENA Sor, MD;  Location: [facility] INVASIVE CV LAB;  Service: Cardiovascular;  Laterality: N/A;   LEFT HEART CATH AND CORONARY ANGIOGRAPHY N/A 10/27/2017   Procedure: LEFT HEART CATH AND CORONARY ANGIOGRAPHY;  Surgeon: Jordan, Peter M, MD;  Location: [facility] INVASIVE CV LAB;  Service: Cardiovascular;  Laterality: N/A;   LEFT HEART CATH AND CORONARY ANGIOGRAPHY N/A 01/16/2018   Procedure: LEFT HEART CATH AND CORONARY ANGIOGRAPHY;  Surgeon: Jordan, Peter M, MD;  Location: [facility] INVASIVE CV LAB;  Service: Cardiovascular;  Laterality: N/A;   Patient Active Problem List   Diagnosis Date Noted   Acute CVA (cerebrovascular accident) (HCC) 03/23/2024   Acute ST elevation myocardial infarction (STEMI) involving left anterior descending (LAD) coronary artery (HCC) 10/27/2017   Prolonged QT interval 04/23/2016   CAD (coronary artery disease) 04/23/2016   H/O noncompliance with medical treatment, presenting hazards to health 04/23/2016   Hyperlipidemia LDL goal <70    Ischemic cardiomyopathy    Acute ST elevation myocardial infarction (STEMI) of anterior wall (HCC) 04/21/2016   HTN (hypertension) 04/21/2016   Tobacco abuse 04/21/2016   Ventricular tachycardia, sustained (HCC) 04/21/2016   Morbid obesity (HCC) 01/01/2016   Acute coronary syndrome (HCC) 12/31/2015   Cocaine  abuse (HCC) 12/31/2015   Leukocytosis 12/31/2015    ONSET DATE: 04/11/2024 (date of referral)  REFERRING DIAG: P36.487 (ICD-10-CM) -  Cerebral infarction due to unspecified occlusion or stenosis of left middle cerebral artery I69.351 (ICD-10-CM) - 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 side 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  Rationale for Evaluation and Treatment: Rehabilitation  SUBJECTIVE:                                                                                                                                                                                             SUBJECTIVE STATEMENT: *** Pt accompanied by: {accompnied:27141}  PERTINENT HISTORY: *** Pt hospitalized 03/22/24-03/29/24 with R sided weakness, dysarthria, and facial droop; (+) acute CVA; UDS (+) cocaine  03/22/24.  Pt discharged to [facility] 03/29/24.  PMH includes CAD with STEMI 2017 s/p LAD stent, ischemic cardiomyopathy with EF 40% 2017 (most recent echo normal EF), h/o noncompliance with medical therapies, htn, cocaine -induce MI, V-tach 2017.  Per notes h/o suicidal; sciatica.  PAIN:  Are you having pain? {OPRCPAIN:27236}  PRECAUTIONS: Fall  RED FLAGS: {PT Red Flags:29287}   WEIGHT BEARING RESTRICTIONS: {Yes ***/No:24003}  FALLS: Has patient fallen in last 6 months? {fallsyesno:27318}  LIVING ENVIRONMENT: Lives with: {OPRC lives with:25569::lives with their family} Lives in: {Lives in:25570} Stairs: {opstairs:27293} Has following equipment at home: {Assistive devices:23999}  PLOF: {PLOF:24004}Ambulatory with SPC prior to CVA?  R knee buckling and hyperextension during hospitalization (using hallway railing d/t hemi-walker not stable enough)?***  PATIENT GOALS: ***  OBJECTIVE:  Note: Objective measures were completed at Evaluation unless otherwise noted.  DIAGNOSTIC FINDINGS:  MRI Brain 03/22/24: 1. Acute small vessel bland infarct in the left periventricular white matter extending to the posterior limb of the left internal capsule. 2. Moderate cerebral white matter disease. 3. Age-related atrophy.  COGNITION: Overall cognitive status:  {cognition:24006}   SENSATION: {sensation:27233}  COORDINATION: ***  EDEMA:  {edema:24020}  MUSCLE TONE: {LE tone:25568}  MUSCLE LENGTH: Hamstrings: Right *** deg; Left *** deg Debby test: Right *** deg; Left *** deg  DTRs:  {DTR SITE:24025}  POSTURE: {posture:25561}  LOWER EXTREMITY ROM:     {AROM/PROM:27142}  Right Eval Left Eval  Hip flexion    Hip extension    Hip abduction    Hip adduction    Hip internal rotation    Hip external rotation    Knee flexion    Knee extension    Ankle dorsiflexion    Ankle plantarflexion    Ankle inversion    Ankle eversion     (Blank rows = not tested)  LOWER EXTREMITY MMT:    MMT  Right Eval Left Eval  Hip flexion    Hip extension    Hip abduction    Hip adduction    Hip internal rotation    Hip external rotation    Knee flexion    Knee extension    Ankle dorsiflexion    Ankle plantarflexion    Ankle inversion    Ankle eversion    (Blank rows = not tested)  BED MOBILITY:  {bed mobility:32615:p}  TRANSFERS: {transfers eval:32620}  STAIRS: {stairs eval:32618} GAIT: Findings: {GaitneuroPT:32644::Distance walked: ***,Comments: ***}  FUNCTIONAL TESTS:  {Functional tests:24029}  PATIENT SURVEYS:  {rehab surveys:24030}                                                                                                                              TREATMENT DATE: ***    PATIENT EDUCATION: Education details: ***Eval results; POC. Person educated: {Person educated:25204} Education method: {Education Method:25205} Education comprehension: {Education Comprehension:25206}  HOME EXERCISE PROGRAM: ***  GOALS: Goals reviewed with patient? {yes/no:20286}  SHORT TERM GOALS: Target date: ***  *** Baseline: Goal status: INITIAL  2.  *** Baseline:  Goal status: INITIAL  3.  *** Baseline:  Goal status: INITIAL  4.  *** Baseline:  Goal status: INITIAL  5.  *** Baseline:  Goal status:  INITIAL  6.  *** Baseline:  Goal status: INITIAL  LONG TERM GOALS: Target date: ***  *** Baseline:  Goal status: INITIAL  2.  *** Baseline:  Goal status: INITIAL  3.  *** Baseline:  Goal status: INITIAL  4.  *** Baseline:  Goal status: INITIAL  5.  *** Baseline:  Goal status: INITIAL  6.  *** Baseline:  Goal status: INITIAL  ASSESSMENT:  CLINICAL IMPRESSION: Patient is a 64 y.o. female who was seen today for physical therapy evaluation and treatment for CVA.  Patient presents with ***. These impairments are limiting patient from ***.  Evaluation included the following assessment tools: ***.   Patient will benefit from skilled PT to address noted impairments, improve overall function, and progress towards long term goals.  OBJECTIVE IMPAIRMENTS: {opptimpairments:25111}.   ACTIVITY LIMITATIONS: {activitylimitations:27494}  PARTICIPATION LIMITATIONS: {participationrestrictions:25113}  PERSONAL FACTORS: {Personal factors:25162} are also affecting patient's functional outcome.   REHAB POTENTIAL: {rehabpotential:25112}  CLINICAL DECISION MAKING: {clinical decision making:25114}  EVALUATION COMPLEXITY: {Evaluation complexity:25115}  PLAN:  PT FREQUENCY: {rehab frequency:25116}  PT DURATION: {rehab duration:25117}  PLANNED INTERVENTIONS: {rehab planned interventions:25118::97110-Therapeutic exercises,97530- Therapeutic [phone]- Neuromuscular re-education,97535- Self Rjmz,02859- Manual therapy,Patient/Family education}  PLAN FOR NEXT SESSION: ***   Damien Caulk, PT 05/04/2024, 2:43 PM        "

## 2024-05-06 NOTE — Therapy (Unsigned)
 " OUTPATIENT OCCUPATIONAL THERAPY NEURO EVALUATION  Patient Name: Natasha Roach MRN: [id number] DOB:November 14, 1960, 64 y.o., female Today's Date: 05/07/2024  PCP: Catalina Bare, MD REFERRING PROVIDER: Lenetta Dover, MD  END OF SESSION:  OT End of Session - 05/07/24 1040     Visit Number 1    Number of Visits 17   including eval   Date for Recertification  08/05/24    Authorization Type [employer] MCD-auth req'd    OT Start Time 0927    OT Stop Time 1021    OT Time Calculation (min) 54 min    Equipment Utilized During Treatment testing materials    Behavior During Therapy Lability;Anxious          Past Medical History:  Diagnosis Date   CAD (coronary artery disease) 04/23/2016   a. 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 b. STEMI 03/2016 due to cocaine , occ mLAD s/p Synergy stent, EF 40% by cath, 55% by echo.   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 at time of STEMI 03/2016.   Past Surgical History:  Procedure Laterality Date   ABDOMINAL HYSTERECTOMY     BREAST SURGERY     abcess   CARDIAC CATHETERIZATION  2004   CARDIAC CATHETERIZATION N/A 04/21/2016   Procedure: Left Heart Cath and Coronary Angiography;  Surgeon: Debby DELENA Sor, MD;  Location: [facility] INVASIVE CV LAB;  Service: Cardiovascular;  Laterality: N/A;   CARDIAC CATHETERIZATION N/A 04/21/2016   Procedure: Coronary Stent Intervention;  Surgeon: Debby DELENA Sor, MD;  Location: [facility] INVASIVE CV LAB;  Service: Cardiovascular;  Laterality: N/A;   CORONARY STENT INTERVENTION N/A 10/27/2017   Procedure: CORONARY STENT INTERVENTION;  Surgeon: Jordan, Peter M,  MD;  Location: [facility] INVASIVE CV LAB;  Service: Cardiovascular;  Laterality: N/A;   CORONARY/GRAFT ACUTE MI REVASCULARIZATION N/A 10/27/2017   Procedure: Coronary/Graft Acute MI Revascularization;  Surgeon: Jordan, Peter M, MD;  Location: [facility] INVASIVE CV LAB;  Service: Cardiovascular;  Laterality: N/A;   CORONARY/GRAFT ACUTE MI REVASCULARIZATION N/A 01/16/2018   Procedure: Coronary/Graft Acute MI Revascularization;  Surgeon: Jordan, Peter M, MD;  Location: [facility] INVASIVE CV LAB;  Service: Cardiovascular;  Laterality: N/A;   ELECTROPHYSIOLOGIC STUDY N/A 04/21/2016   Procedure: Cardioversion;  Surgeon: Debby DELENA Sor, MD;  Location: [facility] INVASIVE CV LAB;  Service: Cardiovascular;  Laterality: N/A;   LEFT HEART CATH AND CORONARY ANGIOGRAPHY N/A 10/27/2017   Procedure: LEFT HEART CATH AND CORONARY ANGIOGRAPHY;  Surgeon: Jordan, Peter M, MD;  Location: [facility] INVASIVE CV LAB;  Service: Cardiovascular;  Laterality: N/A;   LEFT HEART CATH AND CORONARY ANGIOGRAPHY N/A 01/16/2018   Procedure: LEFT HEART CATH AND CORONARY ANGIOGRAPHY;  Surgeon: Jordan, Peter M, MD;  Location: [facility] INVASIVE CV LAB;  Service: Cardiovascular;  Laterality: N/A;   Patient Active Problem List   Diagnosis Date Noted   Acute CVA (cerebrovascular accident) (HCC) 03/23/2024   Acute ST elevation myocardial infarction (STEMI) involving left anterior descending (LAD) coronary artery (HCC) 10/27/2017   Prolonged QT interval 04/23/2016   CAD (coronary artery disease) 04/23/2016   H/O noncompliance with medical treatment,  presenting hazards to health 04/23/2016   Hyperlipidemia LDL goal <70    Ischemic cardiomyopathy    Acute ST elevation myocardial infarction (STEMI) of anterior wall (HCC) 04/21/2016   HTN (hypertension) 04/21/2016   Tobacco abuse 04/21/2016   Ventricular tachycardia, sustained (HCC) 04/21/2016   Morbid obesity (HCC) 01/01/2016   Acute coronary syndrome (HCC) 12/31/2015   Cocaine  abuse (HCC) 12/31/2015   Leukocytosis 12/31/2015     ONSET DATE: 04/29/2024 referral date; 1128/25-03/29/24 hospitlaization  REFERRING DIAG: P36.487 (ICD-10-CM) - Cerebral infarction due to unspecified occlusion or stenosis of left middle cerebral artery I69.351 (ICD-10-CM) - 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 side I69.323 (ICD-10-CM) - Fluency disorder following cerebral infarction G81.91 (ICD-10-CM) - Hemiplegia, unspecified affecting right dominant side  THERAPY DIAG:  Other symptoms and signs involving the musculoskeletal system  Other lack of coordination  Rationale for Evaluation and Treatment: Rehabilitation  SUBJECTIVE:   SUBJECTIVE STATEMENT: I've got some R sided weakness. My speech comes and goes, if I get excited I can't really talk well.  Pt accompanied by: significant other  who waited in lobby  PERTINENT HISTORY: MRI showed: acute small vessel bland infarct in the left periventricular white matter extending to the posterior limb of the left internal capsule. Moderate cerebral white matter disease. PMHx: suicidal, HTN, HLD, CAD, cocaine  abuse, tobacco abuse, MI   PRECAUTIONS: Fall and Other: R sided weakness, substance abuse, cardiac stent (not recent)  WEIGHT BEARING RESTRICTIONS: No  PAIN:  Are you having pain? No  FALLS: Has patient fallen in last 6 months? No  LIVING ENVIRONMENT: Lives with: lives with their spouse, recently got an emotional support dog as well  Lives in: House/apartment Stairs: Yes: External: 1 steps; none Has following equipment at home: Vannie - 4 wheeled, Wheelchair (manual), shower chair, and Grab bars, handheld shower nozzle  PLOF: Independent, was cooking, cleaning and driving before.  PATIENT GOALS: To return function in my right hand and get back to how I was.  OBJECTIVE:  Note: Objective measures were completed at Evaluation unless otherwise noted.  HAND DOMINANCE: Right, but having to use L hand d/t R sided  weakness  ADLs: Overall ADLs: Fairly independent with the exception of dressing Transfers/ambulation related to ADLs: I/Mod I Eating: Independent Grooming: Wears wigs, can brush teeth but a little difficulty applying makeup. UB Dressing: Requires A donning R side. LB Dressing: Requires A with socks Toileting: Independent, but some difficulty when she has to void late at night d/t it feels like my muscles aren't awake yet.  Bathing: Mod I Tub Shower transfers: Mod I Equipment: Shower seat with back and handheld nozzle  IADLs: Shopping: None at this time Light housekeeping: Pt completing some light housekeeping including cleaning toilets. Meal Prep: Husband assisting Community mobility: not cleared to drive at this time Medication management: Pt completes but has difficulty opening up pill bottles Financial management: Pt and s/o share responsibility. Handwriting: unable  MOBILITY STATUS: Needs Assist: using 4ww   POSTURE COMMENTS:  No Significant postural limitations Sitting balance: WFL  ACTIVITY TOLERANCE: Activity tolerance: Pt reports it depends on the situation, if things make her nervous or things are high emotion she gets very tired.   FUNCTIONAL OUTCOME MEASURES: Upper Extremity Functional Scale (UEFS): 39/80, functioning at 48.75%, 51.25% impairment  UPPER EXTREMITY ROM:  LUE ROM WNL, passive RUE ROM WFL with exception of shoulder flexion, pain at approximately 120 degrees.  Active ROM Right eval Left eval  Shoulder flexion    Shoulder  abduction    Shoulder adduction    Shoulder extension    Shoulder internal rotation    Shoulder external rotation    Elbow flexion    Elbow extension    Wrist flexion    Wrist extension    Wrist ulnar deviation    Wrist radial deviation    Wrist pronation    Wrist supination    (Blank rows = not tested)  UPPER EXTREMITY MMT:   LUE WNL  MMT Right eval Left eval  Shoulder flexion    Shoulder abduction    Shoulder  adduction    Shoulder extension    Shoulder internal rotation    Shoulder external rotation    Middle trapezius    Lower trapezius    Elbow flexion    Elbow extension    Wrist flexion    Wrist extension    Wrist ulnar deviation    Wrist radial deviation    Wrist pronation    Wrist supination    (Blank rows = not tested)  HAND FUNCTION: Grip strength: Right: NT lbs; Left: 54.5 avg lbs (Lt=56.2, 54.2, 53.1) COORDINATION: 9 Hole Peg test: Right: NT sec; Left: 30.14 sec Box and Blocks:  Right NTblocks, Left 63blocks  SENSATION: Pt reports no changes in sensation, but sometimes her fingers are stiff or sore. WFL  EDEMA: Pt reported she had some before, but none now.  MUSCLE TONE: RUE: Hypotonic and Flaccid  COGNITION: Overall cognitive status: Impaired: Areas of impairment:  Behavior: does get emotional at ttimes with difficulty in speech.   VISION: Subjective report: my eyes have always been bad, I haven't had my glasses on during the stroke. But it's okay now. Baseline vision: Wears glasses all the time Visual history: none  VISION ASSESSMENT: Not tested  Patient has difficulty with following activities due to following visual impairments: none  PERCEPTION: WFL  PRAXIS: Not tested  OBSERVATIONS: hemiplegic/hemiparetic R side, difficulty finding words, difficulty completing ADLs and IADLs, some emotional lability (pt becomes tearful easily).                                                                                                                             TREATMENT DATE: 05/07/24  Pt educated in purpose of OT, goals, and POC. Pt tearful during session but participated in eval and ensuing tx. Pt educated in hemiplegic sleep positioning for affected R side, handout provided with pictorial and written instruction. Pt asked if emotional lability could happen with stroke as she would not cry as easily before, confirmed this. Pt asked if function would return,  encouraged pt to complete previously issued HEP (pt reported being given some stretches/PROM when in IP) and try to complete safe weight shifting to RUE.        PATIENT EDUCATION: Education details: see above Person educated: Patient Education method: Explanation and Handouts Education comprehension: verbalized understanding and needs further education  HOME EXERCISE PROGRAM: 05/07/24: sleep positioning  GOALS: Goals reviewed with patient? Yes  SHORT TERM GOALS: Target date: 06/07/24  Pt will verbalize understanding of adapted strategies and/or equipment PRN to increase safety and independence with ADLs and IADLs (I.e. sock aide, shoe horn, dressing stick, elastic shoelaces)  Baseline: New to OP OT Goal status: INITIAL  2. Patient will demonstrate initial R UE HEP with 25% verbal cues or less for proper execution. Baseline: New to outpt OT Goal status: INITIAL   3.  Patient will demonstrate improved function in R hand by being able to form a gross composite fist Baseline: unable Goal status: INITIAL    4.  Patient will independently recall at least 3 energy conservation principles in relation to ADLs to increase functional independence.  Baseline: New to OP OT Goal status: INITIAL  5. Pt will report use of at least 2 emotional regulation/coping techniques (ie: meditation, deep breathing) Baseline: New to OP OT Goal status: INITIAL    LONG TERM GOALS: Target date: 08/05/24  Pt will demonstrate improved function as evidenced by an increase in UEFS by at least 10 points Baseline: 39/80 Goal status: INITIAL  2. Pt will demonstrate improved function in R hand by a grip strength score of at least 10 pounds Baseline: 0 Goal status: INITIAL  3.  Pt will demonstrate improved function of RUE by flexing R shoulder to 90 degrees without compensatory movement Baseline: <90 degrees with compensation Goal status: INITIAL  4.  Pt will complete dressing tasks with  Independence or Mod I.  Baseline: Min A, considerate difficulty with socks Goal status: INITIAL  ASSESSMENT:  CLINICAL IMPRESSION: Patient is a 64 y.o. female who was seen today for occupational therapy evaluation for acute ischemic stroke. Hx includes HTN, HLD, CAD, cocaine  abuse, tobacco abuse, MI, suicidal thoughts. Patient currently presents below baseline level of functioning demonstrating functional deficits and impairments as noted below. Pt would benefit from skilled OT services in the outpatient setting to work on impairments as noted below to help pt return to PLOF as able.     PERFORMANCE DEFICITS: in functional skills including ADLs, IADLs, coordination, dexterity, ROM, strength, pain, muscle spasms, Fine motor control, Gross motor control, cardiopulmonary status limiting function, decreased knowledge of use of DME, and UE functional use, cognitive skills including emotional, safety awareness, temperament/personality, thought, and understand, and psychosocial skills including coping strategies, environmental adaptation, habits, interpersonal interactions, and routines and behaviors.   IMPAIRMENTS: are limiting patient from ADLs, IADLs, rest and sleep, leisure, and social participation.   CO-MORBIDITIES: may have co-morbidities  that affects occupational performance. Patient will benefit from skilled OT to address above impairments and improve overall function.  MODIFICATION OR ASSISTANCE TO COMPLETE EVALUATION: Min-Moderate modification of tasks or assist with assess necessary to complete an evaluation.  OT OCCUPATIONAL PROFILE AND HISTORY: Detailed assessment: Review of records and additional review of physical, cognitive, psychosocial history related to current functional performance.  CLINICAL DECISION MAKING: Moderate - several treatment options, min-mod task modification necessary  REHAB POTENTIAL: Fair evidenced by motivation to participate, however hx of substance abuse may  affect rehab potential  EVALUATION COMPLEXITY: Moderate    PLAN:  OT FREQUENCY: 2x/week  OT DURATION: 8 weeks  PLANNED INTERVENTIONS: 97168 OT Re-evaluation, 97535 self care/ADL training, 02889 therapeutic exercise, 97530 therapeutic activity, 97112 neuromuscular re-education, 97140 manual therapy, passive range of motion, psychosocial skills training, energy conservation, coping strategies training, patient/family education, and DME and/or AE instructions  RECOMMENDED OTHER SERVICES: May benefit from speech eval to work on finding words  CONSULTED AND AGREED WITH PLAN OF CARE: Patient  PLAN FOR NEXT SESSION: WB activities AE education (sock aide) Sleep hygiene education   For all possible CPT codes, reference the Planned Interventions line above.     Check all conditions that are expected to impact treatment: {Conditions expected to impact treatment:Contractures, spasticity or fracture relevant to requested treatment and Social determinants of health   If treatment provided at initial evaluation, no treatment charged due to lack of authorization.      Rocky Dutch, OT 05/07/2024, 10:44 AM           "

## 2024-05-07 ENCOUNTER — Ambulatory Visit

## 2024-05-07 ENCOUNTER — Ambulatory Visit: Attending: Student in an Organized Health Care Education/Training Program | Admitting: Physical Therapy

## 2024-05-07 ENCOUNTER — Other Ambulatory Visit: Payer: Self-pay

## 2024-05-07 DIAGNOSIS — I69351 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 side: Secondary | ICD-10-CM | POA: Diagnosis present

## 2024-05-07 DIAGNOSIS — R41844 Frontal lobe and executive function deficit: Secondary | ICD-10-CM

## 2024-05-07 DIAGNOSIS — R2689 Other abnormalities of gait and mobility: Secondary | ICD-10-CM | POA: Insufficient documentation

## 2024-05-07 DIAGNOSIS — M6281 Muscle weakness (generalized): Secondary | ICD-10-CM | POA: Insufficient documentation

## 2024-05-07 DIAGNOSIS — R278 Other lack of coordination: Secondary | ICD-10-CM | POA: Diagnosis present

## 2024-05-07 DIAGNOSIS — R29898 Other symptoms and signs involving the musculoskeletal system: Secondary | ICD-10-CM | POA: Diagnosis present

## 2024-05-07 DIAGNOSIS — Z9181 History of falling: Secondary | ICD-10-CM | POA: Diagnosis present

## 2024-05-07 DIAGNOSIS — R29818 Other symptoms and signs involving the nervous system: Secondary | ICD-10-CM

## 2024-05-12 NOTE — Therapy (Signed)
 " OUTPATIENT PHYSICAL THERAPY NEURO EVALUATION   Patient Name: Natasha Roach MRN: [id number] DOB:1961-01-24, 64 y.o., female Today's Date: 05/18/2024   PCP: Natasha Bare, MD REFERRING PROVIDER: Lenetta Dover, MD  END OF SESSION:  PT End of Session - 05/17/24 0857     Visit Number 1    Number of Visits 13   12 visits plus Eval   Date for Recertification  07/12/24    Authorization Type [employer]    PT Start Time [phone]   pt arrived late   PT Stop Time 0930    PT Time Calculation (min) 34 min    Equipment Utilized During Treatment Gait belt    Activity Tolerance Patient tolerated treatment well    Behavior During Therapy Lability;Anxious          Past Medical History:  Diagnosis Date   CAD (coronary artery disease) 04/23/2016   a. 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 b. STEMI 03/2016 due to cocaine , occ mLAD s/p Synergy stent, EF 40% by cath, 55% by echo.   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 at time of STEMI 03/2016.   Past Surgical History:  Procedure Laterality Date   ABDOMINAL HYSTERECTOMY     BREAST SURGERY     abcess   CARDIAC CATHETERIZATION  2004   CARDIAC CATHETERIZATION N/A 04/21/2016   Procedure: Left Heart Cath and Coronary Angiography;  Surgeon: Natasha DELENA Sor, MD;  Location: [facility] INVASIVE CV LAB;  Service: Cardiovascular;  Laterality: N/A;   CARDIAC CATHETERIZATION N/A 04/21/2016   Procedure: Coronary Stent Intervention;  Surgeon: Natasha DELENA Sor, MD;  Location: [facility] INVASIVE CV LAB;  Service: Cardiovascular;  Laterality: N/A;   CORONARY STENT INTERVENTION N/A 10/27/2017    Procedure: CORONARY STENT INTERVENTION;  Surgeon: Jordan, Peter M, MD;  Location: [facility] INVASIVE CV LAB;  Service: Cardiovascular;  Laterality: N/A;   CORONARY/GRAFT ACUTE MI REVASCULARIZATION N/A 10/27/2017   Procedure: Coronary/Graft Acute MI Revascularization;  Surgeon: Jordan, Peter M, MD;  Location: [facility] INVASIVE CV LAB;  Service: Cardiovascular;  Laterality: N/A;   CORONARY/GRAFT ACUTE MI REVASCULARIZATION N/A 01/16/2018   Procedure: Coronary/Graft Acute MI Revascularization;  Surgeon: Jordan, Peter M, MD;  Location: [facility] INVASIVE CV LAB;  Service: Cardiovascular;  Laterality: N/A;   ELECTROPHYSIOLOGIC STUDY N/A 04/21/2016   Procedure: Cardioversion;  Surgeon: Natasha DELENA Sor, MD;  Location: [facility] INVASIVE CV LAB;  Service: Cardiovascular;  Laterality: N/A;   LEFT HEART CATH AND CORONARY ANGIOGRAPHY N/A 10/27/2017   Procedure: LEFT HEART CATH AND CORONARY ANGIOGRAPHY;  Surgeon: Jordan, Peter M, MD;  Location: [facility] INVASIVE CV LAB;  Service: Cardiovascular;  Laterality: N/A;   LEFT HEART CATH AND CORONARY ANGIOGRAPHY N/A 01/16/2018   Procedure: LEFT HEART CATH AND CORONARY ANGIOGRAPHY;  Surgeon: Jordan, Peter M, MD;  Location: [facility] INVASIVE CV LAB;  Service: Cardiovascular;  Laterality: N/A;   Patient Active Problem List   Diagnosis Date Noted   Acute CVA (cerebrovascular accident) (HCC) 03/23/2024   Acute ST elevation myocardial infarction (STEMI) involving left anterior descending (LAD) coronary artery (HCC) 10/27/2017   Prolonged QT  interval 04/23/2016   CAD (coronary artery disease) 04/23/2016   H/O noncompliance with medical treatment, presenting hazards to health 04/23/2016   Hyperlipidemia LDL goal <70    Ischemic cardiomyopathy    Acute ST elevation myocardial infarction (STEMI) of anterior wall (HCC) 04/21/2016   HTN (hypertension) 04/21/2016   Tobacco abuse 04/21/2016   Ventricular tachycardia, sustained (HCC) 04/21/2016   Morbid obesity (HCC) 01/01/2016   Acute coronary syndrome (HCC) 12/31/2015    Cocaine  abuse (HCC) 12/31/2015   Leukocytosis 12/31/2015    ONSET DATE: 04/11/2024 (date of referral)  REFERRING DIAG: I63.512 (ICD-10-CM) - Cerebral infarction due to unspecified occlusion or stenosis of left middle cerebral artery I69.351 (ICD-10-CM) - 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 side I69.323 (ICD-10-CM) - Fluency disorder following cerebral infarction G81.91 (ICD-10-CM) - Hemiplegia, unspecified affecting right dominant side  THERAPY DIAG:  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 side (HCC)  History of falling  Muscle weakness (generalized)  Other symptoms and signs involving the musculoskeletal system  Other symptoms and signs involving the nervous system  Other lack of coordination  Other abnormalities of gait and mobility  Rationale for Evaluation and Treatment: Rehabilitation  SUBJECTIVE:  Likes to be called Natasha Roach                                                                                                                                                                                           SUBJECTIVE STATEMENT: Pt arrived in manual w/c (pushing with L UE and L LE).  Pt reports difficulty using R UE and R LE after stroke.  Difficulty talking when she gets nervous (comes and goes).  R toes curl in when trying to walk; also happens when sleeping.  Hasn't started baclofen  for spasticity (going to pharmacy today).  Did not bring R AFO (needs to buy wider shoe for better fit). Pt accompanied by: self and significant other (husband--Natasha Roach: stayed in waiting room)  PERTINENT HISTORY: Pt hospitalized 03/22/24-03/29/24 with R sided weakness, dysarthria, and facial droop; (+) acute CVA; UDS (+) cocaine  03/22/24.  Pt discharged to [facility] 03/29/24.  PMH includes CAD with STEMI 2017 s/p LAD stent, ischemic cardiomyopathy with EF 40% 2017 (most recent echo normal  EF), h/o noncompliance with medical therapies, htn, cocaine -induce MI, V-tach 2017.  Per notes h/o sciatica.  PAIN:  Are you having pain? No  PRECAUTIONS: Fall  RED FLAGS: None   WEIGHT BEARING RESTRICTIONS: No  FALLS: Has patient fallen in last 6 months? Yes. Number of falls 2 (fell up against wall 1x about 2 weeks ago; fell on ground 1x when  trying to get to bathroom on own about 1 month ago  LIVING ENVIRONMENT: Lives with: lives with their spouse and recently got an emotional support dog Lives in: House/apartment (apt) Stairs: Yes: External: 1 steps; none; has ramp but doesn't use Has following equipment at home: Vannie - 4 wheeled, Wheelchair (manual), shower chair, Grab bars, and handheld shower nozzle; BSC.  PLOF: Ambulatory independently prior to CVA.  R knee buckling and hyperextension noted during hospitalization.  R hand dominant.  Currently walking with [facility] or manual w/c at home depending on the situation.  PATIENT GOALS: To be able to talk right again; improve walking; improve strength.  OBJECTIVE:  Note: Objective measures were completed at Evaluation unless otherwise noted.  DIAGNOSTIC FINDINGS:  MRI Brain 03/22/24: 1. Acute small vessel bland infarct in the left periventricular white matter extending to the posterior limb of the left internal capsule. 2. Moderate cerebral white matter disease. 3. Age-related atrophy.  COGNITION: Overall cognitive status: A&Ox4; emotionally labile during session (reports since stroke)   SENSATION: Intact light touch B LE's (bottom of feet not assessed)  COORDINATION: Intact sitting heel to shin L LE; limited R LE d/t weakness  EDEMA:  No swelling noted B LE's  MUSCLE TONE: 2 beats clonus R ankle  POSTURE: rounded shoulders and forward head  LOWER EXTREMITY ROM:     Passive  Right Eval Left Eval  Hip flexion    Hip extension    Hip abduction    Hip adduction    Hip internal rotation    Hip external rotation     Knee flexion    Knee extension    Ankle dorsiflexion Neutral   Ankle plantarflexion    Ankle inversion    Ankle eversion     (Blank rows = not tested)  LOWER EXTREMITY MMT:    MMT Right Eval Left Eval  Hip flexion 4-/5 (tends to ER R LE) 5/5  Hip extension    Hip abduction    Hip adduction    Hip internal rotation    Hip external rotation    Knee flexion 4/5 5/5  Knee extension 3-/5 5/5  Ankle dorsiflexion 1/5 5/5  Ankle plantarflexion At least 2/5 At least 3/5 AROM  Ankle inversion    Ankle eversion    (Blank rows = not tested)  BED MOBILITY:  Pt reports modified independent.  TRANSFERS: Sit to stand: SBA  Assistive device utilized: Counselling psychologist     Stand to sit: SBA  Assistive device utilized: Counselling psychologist      GAIT: Findings: Gait Characteristics: R foot drop; decreased R LE foot clearance; increased L lean to advance R LE (decreased R hip and knee flexion), Distance walked: 40 feet, Assistive device utilized:Quad cane small base, Level of assistance: CGA, and Comments: R knee hyperextension noted (pt did not bring R AFO)  FUNCTIONAL TESTS:  5 times sit to stand: 23.60 seconds (use of L UE) 10 meter walk test: 0.187 Roach/sec (53.28 seconds with SBQC)  PATIENT SURVEYS:  TBA  VITALS: Vitals:   05/17/24 0910  BP: 134/75  Pulse: 69  TREATMENT DATE: 05/17/24    PATIENT EDUCATION: Education details: Eval results; POC.  Bring R AFO for therapy. Person educated: Patient Education method: Explanation and Verbal cues Education comprehension: verbalized understanding, verbal cues required, and needs further education  HOME EXERCISE PROGRAM: TBA  GOALS: Goals reviewed with patient? Yes  SHORT TERM GOALS: Target date: 06/14/2024  Pt will be at least 50% compliant with initial HEP in order to improve strength and balance in  order to decrease fall risk and improve function at home for ADL's. Baseline: TBA Goal status: INITIAL  2.  Assess BERG Balance Test and update LTG as needed. Baseline: TBA Goal status: INITIAL  3.  Patient will deny any falls over past 4 weeks to demonstrate improved safety awareness at home. Baseline: Fall about 2 weeks prior to PT evaluation. Goal status: INITIAL  4.  Pt will increase by at least 0.06 Roach/s in order to demonstrate improvement in ambulation.  Baseline: 0.187 Roach/sec Goal status: INITIAL   LONG TERM GOALS: Target date: 06/28/2024  Pt will be at least 75% compliant with final HEP in order to improve strength and balance in order to decrease fall risk and improve function at home for ADL's. Baseline: TBA Goal status: INITIAL  2.  Pt will decrease 5 Time Sit to Stand by at least 3 seconds in order to demonstrate clinically significant improvement in LE strength. Baseline: 23.60 seconds Goal status: INITIAL  3.  Pt will increase by at least 0.13 Roach/s in order to demonstrate clinically significant improvement in community ambulation.  Baseline: 0.187 Roach/sec Goal status: INITIAL  4.  Pt will improve BERG by at least 3 points in order to demonstrate clinically significant improvement in balance. Baseline: TBA Goal status: INITIAL  5.  Pt will ambulate 115 feet with LRAD and SBA level of assist to promote household and community access. Baseline: 40 feet CGA with SBQC Goal status: INITIAL   ASSESSMENT:  CLINICAL IMPRESSION: Patient is a 64 y.o. female who was seen today for physical therapy evaluation and treatment for CVA.  Pt arrived in manual w/c.  Patient presents with R UE/LE weakness (has R AFO but did not bring to evaluation), impaired R ankle DF ROM, increased tone R LE, impaired balance, and impaired functional mobility (pt using manual w/c or SBQC short distances depending on situation). These impairments are limiting patient from ADL's and community  activities.  Evaluation included the following assessment tools: 5 time sit to stand and .  Pt scored 23.60 seconds on the 5 time sit to stand test indicating pt is at increased risk of falls (>15 seconds = increased risk of falls).  Pt scored 0.187 Roach/sec on the 10 Meter Walk Test indicating pt is a household ambulator (Cut off scores: <0.4 Roach/s = household Ambulator, 0.4-0.8 Roach/s = limited community Ambulator, >0.8 Roach/s = community Ambulator, >1.2 Roach/s = crossing a street, <1.0 = increased fall risk).   Patient will benefit from skilled PT to address noted impairments, improve overall function, and progress towards long term goals.  OBJECTIVE IMPAIRMENTS: Abnormal gait, decreased activity tolerance, decreased balance, decreased cognition, decreased coordination, decreased endurance, decreased knowledge of condition, decreased knowledge of use of DME, decreased mobility, difficulty walking, decreased ROM, decreased strength, decreased safety awareness, impaired flexibility, impaired sensation, impaired tone, impaired UE functional use, postural dysfunction, and pain.   ACTIVITY LIMITATIONS: carrying, lifting, bending, standing, squatting, stairs, transfers, bathing, toileting, dressing, locomotion level, and caring for others  PARTICIPATION LIMITATIONS: meal prep, cleaning,  laundry, driving, shopping, and community activity  PERSONAL FACTORS: Past/current experiences, Transportation, and 1-2 comorbidities: htn; h/o STEMI are also affecting patient's functional outcome.   REHAB POTENTIAL: Good  CLINICAL DECISION MAKING: Evolving/moderate complexity  EVALUATION COMPLEXITY: Moderate  PLAN:  PT FREQUENCY: 1-2x/week  PT DURATION: 6 weeks  PLANNED INTERVENTIONS: 97164- PT Re-evaluation, 97750- Physical Performance Testing, 97110-Therapeutic exercises, 97530- Therapeutic activity, [id number]- Neuromuscular re-education, 97535- Self Care, [id number]- Manual therapy, [phone]- Gait training, [phone]- Orthotic  Initial, [phone]- Orthotic/Prosthetic subsequent, [phone]- Aquatic Therapy, [phone]- Electrical stimulation (manual), Patient/Family education, Balance training, Stair training, Taping, Joint mobilization, Spinal mobilization, DME instructions, Wheelchair mobility training, Cryotherapy, Moist heat, and Biofeedback  PLAN FOR NEXT SESSION: Assess BERG Balance test.  Issue HEP.  LE strengthening; balance; transfers; gait.   Damien Caulk, PT 05/18/2024, 8:39 AM             "

## 2024-05-13 NOTE — Progress Notes (Unsigned)
 " [facility] [address] [place]. [place] [address] [phone]       HOSPITAL FOLLOW UP NOTE  Ms. Natasha Roach Date of Birth:  [birth year] Medical Record Number:  [id number]   Reason for Referral:  hospital stroke follow up    SUBJECTIVE:   CHIEF COMPLAINT:  No chief complaint on file.   HPI:   Ms. Natasha Roach is a 64 y.o. female with history of hyperlipidemia, CAD, smoker, cocaine  user who presented to ED on 03/22/2024 with right-sided weakness, slurred speech.  Stroke workup revealed left BG/CR infarct secondary to small vessel disease due to uncontrolled risk factors.  Further workup as noted below.  On DAPT PTA and recommended continuation and continued atorvastatin  80 mg daily, LDL 49.  New diagnosis of DM with A1c 6.9, advised outpatient follow-up.  UDS positive for cocaine  with cessation counseling provided as well as counseling provided for current tobacco use.  Therapies recommended CIR but was not approved due to insufficient support after CIR discharge.  She adamantly declined SNF rehab and eventually was accepted to [facility], discharged on 2/5.       PERTINENT IMAGING  CT no acute abnormality CTA head and neck bilateral fetal PCAs, atherosclerosis ICA bulbs, siphons and aorta MRI left BG/CR infarct 2D Echo EF 60-65% LDL 49 HgbA1c 6.9 UDS + cocaine     ROS:   14 system review of systems performed and negative with exception of ***  PMH:  Past Medical History:  Diagnosis Date   CAD (coronary artery disease) 04/23/2016   a. 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 b. STEMI 03/2016 due to cocaine , occ mLAD s/p Synergy stent, EF 40% by cath, 55% by echo.   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 at time of STEMI 03/2016.    PSH:  Past Surgical History:  Procedure Laterality Date   ABDOMINAL HYSTERECTOMY     BREAST SURGERY     abcess   CARDIAC CATHETERIZATION  2004   CARDIAC CATHETERIZATION N/A 04/21/2016   Procedure: Left Heart Cath and Coronary Angiography;  Surgeon: Debby DELENA Sor, MD;  Location: [facility] INVASIVE CV LAB;  Service: Cardiovascular;  Laterality: N/A;   CARDIAC CATHETERIZATION N/A 04/21/2016   Procedure: Coronary Stent Intervention;  Surgeon: Debby DELENA Sor, MD;  Location: [facility] INVASIVE CV LAB;  Service: Cardiovascular;  Laterality: N/A;   CORONARY STENT INTERVENTION N/A 10/27/2017   Procedure: CORONARY STENT INTERVENTION;  Surgeon: Jordan, Peter M, MD;  Location: [facility] INVASIVE CV LAB;  Service: Cardiovascular;  Laterality: N/A;   CORONARY/GRAFT ACUTE MI REVASCULARIZATION N/A 10/27/2017   Procedure: Coronary/Graft Acute MI Revascularization;  Surgeon: Jordan, Peter M, MD;  Location: [facility] INVASIVE CV LAB;  Service: Cardiovascular;  Laterality: N/A;   CORONARY/GRAFT ACUTE MI REVASCULARIZATION N/A 01/16/2018   Procedure: Coronary/Graft Acute MI Revascularization;  Surgeon: Jordan, Peter M, MD;  Location: [facility] INVASIVE CV LAB;  Service: Cardiovascular;  Laterality: N/A;   ELECTROPHYSIOLOGIC STUDY N/A 04/21/2016   Procedure: Cardioversion;  Surgeon: Debby DELENA Sor, MD;  Location: [facility] INVASIVE CV LAB;  Service: Cardiovascular;  Laterality: N/A;   LEFT HEART CATH AND CORONARY ANGIOGRAPHY N/A 10/27/2017   Procedure: LEFT HEART CATH AND CORONARY ANGIOGRAPHY;  Surgeon: Jordan, Peter M, MD;  Location: [facility] INVASIVE CV LAB;  Service: Cardiovascular;  Laterality: N/A;   LEFT HEART CATH AND CORONARY ANGIOGRAPHY N/A 01/16/2018   Procedure: LEFT HEART CATH AND CORONARY ANGIOGRAPHY;  Surgeon: Jordan, Peter M, MD;  Location: [facility] INVASIVE CV LAB;  Service:  Cardiovascular;  Laterality: N/A;    Social History:  Social History   Socioeconomic History   Marital status: Significant Other    Spouse name: Not on file   Number of children: Not on file   Years of education: Not on file   Highest education level: Not on file  Occupational History   Not on file  Tobacco Use   Smoking status: Every Day    Current packs/day: 1.00    Average packs/day: 1 pack/day for 40.0 years (40.0 ttl pk-yrs)    Types: Cigarettes   Smokeless tobacco: Never   Tobacco comments:    1-1.5 PPD  Vaping Use   Vaping status: Never Used  Substance and Sexual Activity   Alcohol use: No   Drug use: Yes    Types: Cocaine     Comment: last use 01/16/18   Sexual activity: Yes  Other Topics Concern   Not on file  Social History Narrative   Not on file   Social Drivers of Health   Tobacco Use: High Risk (03/22/2024)   Patient History    Smoking Tobacco Use: Every Day    Smokeless Tobacco Use: Never    Passive Exposure: Not on file  Financial Resource Strain: Not on file  Food Insecurity: No Food Insecurity (03/22/2024)   Epic    Worried About Programme Researcher, Broadcasting/film/video in the Last Year: Never true    [employer] of Food in the Last Year: Never true  Transportation Needs: No Transportation Needs (03/22/2024)   Epic    Lack of Transportation (Medical): No    Lack of Transportation (Non-Medical): No  Physical Activity: Not on file  Stress: Not on file  Social Connections: Unknown (03/22/2024)   Social Connection and Isolation Panel    Frequency of Communication with Friends and Family: More than three times a week    Frequency of Social Gatherings with Friends and Family: Never    Attends Religious Services: 1 to 4 times per year    Active Member of [employer] or Organizations: Patient declined    Attends Banker Meetings: Patient declined    Marital Status: Patient declined  Intimate Partner Violence: Not At Risk (03/22/2024)   Epic    Fear of Current or  Ex-Partner: No    Emotionally Abused: No    Physically Abused: No    Sexually Abused: No  Depression (PHQ2-9): Low Risk (05/03/2024)   Depression (PHQ2-9)    PHQ-2 Score: 0  Alcohol Screen: Not on file  Housing: Unknown (03/22/2024)   Epic    Unable to Pay for Housing in the Last Year: No    Number of Times Moved in the Last Year: Not on file    Homeless in the Last Year: Patient declined  Utilities: Not At Risk (03/22/2024)   Epic    Threatened with loss of utilities: No  Health Literacy: Not on file    Family History:  Family History  Problem Relation Age of Onset   CAD Father     Medications:  Medications Ordered Prior to  Encounter[1]  Allergies:  Allergies[2]    OBJECTIVE:  Physical Exam  There were no vitals filed for this visit. There is no height or weight on file to calculate BMI. No results found.   General: well developed, well nourished, seated, in no evident distress Head: head normocephalic and atraumatic.   Neck: supple with no carotid or supraclavicular bruits Cardiovascular: regular rate and rhythm, no murmurs Musculoskeletal: no deformity Skin:  no rash/petichiae Vascular:  Normal pulses all extremities   Neurologic Exam Mental Status: Awake and fully alert. Oriented to place and time. Recent and remote memory intact. Attention span, concentration and fund of knowledge appropriate. Mood and affect appropriate.  Cranial Nerves: Fundoscopic exam reveals sharp disc margins. Pupils equal, briskly reactive to light. Extraocular movements full without nystagmus. Visual fields full to confrontation. Hearing intact. Facial sensation intact. Face, tongue, palate moves normally and symmetrically.  Motor: Normal bulk and tone. Normal strength in all tested extremity muscles Sensory.: intact to touch , pinprick , position and vibratory sensation.  Coordination: Rapid alternating movements normal in all extremities. Finger-to-nose and heel-to-shin performed  accurately bilaterally. Gait and Station: Arises from chair without difficulty. Stance is normal. Gait demonstrates normal stride length and balance with ***. Tandem walk and heel toe ***.  Reflexes: 1+ and symmetric. Toes downgoing.     NIHSS  *** Modified Rankin  ***      ASSESSMENT: Natasha Roach is a 64 y.o. year old female with left BG/CR infarct on 03/22/2024 secondary to small vessel disease from uncontrolled risk factors. Vascular risk factors include HLD, new dx of DM, tobacco use, CAD s/p PCI to LAD 2017, HfrEF with recovered EF, cocaine  use and medication noncompliance.      PLAN:  Left BG/CR stroke:  Residual deficit: ***.  Continue aspirin  81mg  daily and Plavix  and atorvastatin  (Lipitor ) for secondary stroke prevention managed/prescribed by PCP.   Discussed secondary stroke prevention measures and importance of close PCP follow up for aggressive stroke risk factor management including BP goal<130/90, HLD with LDL goal<70 and DM with A1c.<7 .  Stroke labs 02/2024: LDL 49, A1c 6.9 I have gone over the pathophysiology of stroke, warning signs and symptoms, risk factors and their management in some detail with instructions to go to the closest emergency room for symptoms of concern.     Follow up in *** or call earlier if needed   CC:  [facility] provider: Dr. Rosemarie PCP: Catalina Bare, MD    I personally spent a total of *** minutes in the care of the patient today including {Time Based Coding:[id number]}.    Harlene Bogaert, AGNP-BC  [facility] [address] [place], [place] [address]  Phone [phone] Fax [phone] Note: This document was prepared with digital dictation and possible smart phrase technology. Any transcriptional errors that result from this process are unintentional.         [1]  Current Outpatient Medications on File Prior to Visit  Medication Sig Dispense Refill   Accu-Chek Softclix  Lancets lancets Use up to four times daily as directed. (FOR ICD-10 E10.9, E11.9). 100 each 0   acetaminophen  (TYLENOL ) 500 MG tablet Take 1,000 mg by mouth 2 (two) times daily as needed for headache or fever (pain).     aspirin  EC 81 MG tablet Take 1 tablet (81 mg total) by mouth daily. 90 tablet 3   atorvastatin  (LIPITOR ) 80 MG tablet Take 1 tablet (80 mg total) by mouth every evening. (Patient taking differently: Take 80  mg by mouth daily.) 90 tablet 2   Blood Glucose Monitoring Suppl (BLOOD GLUCOSE MONITOR SYSTEM) w/Device KIT Use in the morning, at noon, and at bedtime. May substitute to any manufacturer covered by patient's insurance. 1 kit 0   clopidogrel  (PLAVIX ) 75 MG tablet Take 1 tablet (75 mg total) by mouth daily. 90 tablet 0   Ipratropium-Albuterol  (COMBIVENT RESPIMAT) 20-100 MCG/ACT AERS respimat Inhale 1 puff into the lungs every 6 (six) hours as needed for wheezing or shortness of breath.     melatonin 3 MG TABS tablet Take 1 tablet (3 mg total) by mouth at bedtime as needed (insomnia).     metFORMIN  (GLUCOPHAGE ) 500 MG tablet Take 1 tablet (500 mg total) by mouth 2 (two) times daily with a meal. 60 tablet 2   nicotine  (NICODERM CQ  - DOSED IN MG/24 HOURS) 21 mg/24hr patch Place 1 patch (21 mg total) onto the skin daily.     nitroGLYCERIN  (NITROSTAT ) 0.4 MG SL tablet Place 1 tablet (0.4 mg total) under the tongue every 5 (five) minutes x 3 doses as needed for chest pain. (Patient not taking: Reported on 03/22/2024) 75 tablet 2   pantoprazole  (PROTONIX ) 40 MG tablet Take 1 tablet (40 mg total) by mouth daily.     No current facility-administered medications on file prior to visit.  [2] No Known Allergies  "

## 2024-05-14 ENCOUNTER — Encounter: Payer: Self-pay | Admitting: Adult Health

## 2024-05-14 ENCOUNTER — Ambulatory Visit: Admitting: Adult Health

## 2024-05-14 ENCOUNTER — Ambulatory Visit

## 2024-05-14 VITALS — BP 141/71 | HR 67 | Ht 67.0 in

## 2024-05-14 DIAGNOSIS — R29898 Other symptoms and signs involving the musculoskeletal system: Secondary | ICD-10-CM

## 2024-05-14 DIAGNOSIS — M6281 Muscle weakness (generalized): Secondary | ICD-10-CM

## 2024-05-14 DIAGNOSIS — I6381 Other cerebral infarction due to occlusion or stenosis of small artery: Secondary | ICD-10-CM

## 2024-05-14 DIAGNOSIS — R41844 Frontal lobe and executive function deficit: Secondary | ICD-10-CM

## 2024-05-14 DIAGNOSIS — Z720 Tobacco use: Secondary | ICD-10-CM | POA: Diagnosis not present

## 2024-05-14 DIAGNOSIS — G8111 Spastic hemiplegia affecting right dominant side: Secondary | ICD-10-CM

## 2024-05-14 DIAGNOSIS — R278 Other lack of coordination: Secondary | ICD-10-CM

## 2024-05-14 DIAGNOSIS — F1491 Cocaine use, unspecified, in remission: Secondary | ICD-10-CM

## 2024-05-14 DIAGNOSIS — R29818 Other symptoms and signs involving the nervous system: Secondary | ICD-10-CM

## 2024-05-14 DIAGNOSIS — I69351 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 side: Secondary | ICD-10-CM | POA: Diagnosis not present

## 2024-05-14 DIAGNOSIS — Z09 Encounter for follow-up examination after completed treatment for conditions other than malignant neoplasm: Secondary | ICD-10-CM

## 2024-05-14 MED ORDER — BACLOFEN 10 MG PO TABS: 10.0000 mg | ORAL_TABLET | Freq: Three times a day (TID) | ORAL | 11 refills | Status: AC | PRN

## 2024-05-14 NOTE — Patient Instructions (Signed)
??   Sleep Hygiene Handout ?? What Is Sleep Hygiene? Sleep hygiene refers to healthy habits and practices that help improve the quality, duration, and consistency of your sleep. Good sleep hygiene supports mental, emotional, and physical health.  ? Tips for Better Sleep Hygiene 1. Stick to a Consistent Sleep Schedule Go to bed and wake up at the same time every day--even on weekends. Helps regulate your body's internal clock. 2. Create a Relaxing Bedtime Routine Try calming activities before bed (e.g., reading, gentle stretching, meditation). Avoid stressful conversations or work tasks right before sleep. Take a bath or shower. 3. Limit Exposure to Light at Night Dim the lights in the evening. Avoid screens (phones, TVs, computers) at least 1 hour before bed. Use blue light filters if necessary. 4. Make Your Sleep Environment Comfortable Keep your bedroom dark, quiet, and cool  Use blackout curtains, white noise machines, or earplugs if needed. Invest in a comfortable mattress and pillows. Change your bedsheets and make your bed Practice proper sleep positioning Incorporate smells that you enjoy/help you relax like lavender, peppermint, etc. (lotion, bath/beauty products, essential oils/diffuser) 5. Watch What You Eat & Drink Avoid large meals, caffeine, and alcohol close to bedtime. Try to finish eating and drinking fluids at least 2-3 hours before bed.   6. Get Regular Physical Activity Exercise during the day can help you fall asleep faster and enjoy deeper sleep. (complete therapy exercises, walking, go to the gym) Avoid intense workouts late in the evening. 7. Limit Naps Keep naps short (20-30 minutes). Avoid napping late in the afternoon or evening.  ?? Habits That Interfere with Sleep Using your bed for work, watching TV, or eating. Staying in bed when you can't fall asleep (if you're awake for 20+ minutes, get up and do something relaxing in dim light).  ??? Bonus Tips  for Relaxation Try breathing exercises, progressive muscle relaxation, or guided meditation. Apps like Calm, Headspace, or Insight Timer can be helpful.  ?? When to Seek Help Consider talking to a doctor or sleep specialist if: You regularly have trouble falling or staying asleep. You snore loudly or gasp for air during sleep. You feel very sleepy during the day despite adequate sleep.  After a brain injury, sleep becomes especially important for brain recovery and overall health. Here's what the experts recommend: General Recommendation: Most adults are advised to get 7-9 hours of sleep per night. Post-Brain Injury Needs: These individuals often need more sleep than they did before injury, especially during the early stages of recovery.

## 2024-05-14 NOTE — Therapy (Signed)
 " OUTPATIENT OCCUPATIONAL THERAPY NEURO TREATMENT  Patient Name: Natasha Roach MRN: [id number] DOB:Oct 21, 1960, 64 y.o., female Today's Date: 05/14/2024  PCP: Catalina Bare, MD REFERRING PROVIDER: Lenetta Dover, MD  END OF SESSION:  OT End of Session - 05/14/24 1106     Visit Number 2    Number of Visits 17   including eval   Date for Recertification  08/05/24    Authorization Type [employer] MCD    Authorization Time Period 1/19-3/13/26    Authorization - Visit Number 1    Authorization - Number of Visits 16    OT Start Time 1103    OT Stop Time 1145    OT Time Calculation (min) 42 min    Equipment Utilized During Treatment rock tape, sock aide    Activity Tolerance Patient tolerated treatment well    Behavior During Therapy Lability;Anxious          Past Medical History:  Diagnosis Date   CAD (coronary artery disease) 04/23/2016   a. 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 b. STEMI 03/2016 due to cocaine , occ mLAD s/p Synergy stent, EF 40% by cath, 55% by echo.   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 at time of STEMI 03/2016.   Past Surgical History:  Procedure Laterality Date   ABDOMINAL HYSTERECTOMY     BREAST SURGERY     abcess   CARDIAC CATHETERIZATION  2004   CARDIAC CATHETERIZATION N/A 04/21/2016   Procedure: Left Heart Cath and Coronary Angiography;  Surgeon: Debby DELENA Sor, MD;  Location: [facility] INVASIVE CV LAB;  Service: Cardiovascular;  Laterality: N/A;   CARDIAC CATHETERIZATION N/A 04/21/2016   Procedure: Coronary Stent Intervention;  Surgeon: Debby DELENA Sor, MD;  Location: [facility] INVASIVE CV  LAB;  Service: Cardiovascular;  Laterality: N/A;   CORONARY STENT INTERVENTION N/A 10/27/2017   Procedure: CORONARY STENT INTERVENTION;  Surgeon: Jordan, Peter M, MD;  Location: [facility] INVASIVE CV LAB;  Service: Cardiovascular;  Laterality: N/A;   CORONARY/GRAFT ACUTE MI REVASCULARIZATION N/A 10/27/2017   Procedure: Coronary/Graft Acute MI Revascularization;  Surgeon: Jordan, Peter M, MD;  Location: [facility] INVASIVE CV LAB;  Service: Cardiovascular;  Laterality: N/A;   CORONARY/GRAFT ACUTE MI REVASCULARIZATION N/A 01/16/2018   Procedure: Coronary/Graft Acute MI Revascularization;  Surgeon: Jordan, Peter M, MD;  Location: [facility] INVASIVE CV LAB;  Service: Cardiovascular;  Laterality: N/A;   ELECTROPHYSIOLOGIC STUDY N/A 04/21/2016   Procedure: Cardioversion;  Surgeon: Debby DELENA Sor, MD;  Location: [facility] INVASIVE CV LAB;  Service: Cardiovascular;  Laterality: N/A;   LEFT HEART CATH AND CORONARY ANGIOGRAPHY N/A 10/27/2017   Procedure: LEFT HEART CATH AND CORONARY ANGIOGRAPHY;  Surgeon: Jordan, Peter M, MD;  Location: [facility] INVASIVE CV LAB;  Service: Cardiovascular;  Laterality: N/A;   LEFT HEART CATH AND CORONARY ANGIOGRAPHY N/A 01/16/2018   Procedure: LEFT HEART CATH AND CORONARY ANGIOGRAPHY;  Surgeon: Jordan, Peter M, MD;  Location: [facility] INVASIVE CV LAB;  Service: Cardiovascular;  Laterality: N/A;   Patient Active Problem List   Diagnosis Date Noted   Acute CVA (cerebrovascular accident) (HCC) 03/23/2024   Acute  ST elevation myocardial infarction (STEMI) involving left anterior descending (LAD) coronary artery (HCC) 10/27/2017   Prolonged QT interval 04/23/2016   CAD (coronary artery disease) 04/23/2016   H/O noncompliance with medical treatment, presenting hazards to health 04/23/2016   Hyperlipidemia LDL goal <70    Ischemic cardiomyopathy    Acute ST elevation myocardial infarction (STEMI) of anterior wall (HCC) 04/21/2016   HTN (hypertension) 04/21/2016   Tobacco abuse 04/21/2016   Ventricular tachycardia, sustained  (HCC) 04/21/2016   Morbid obesity (HCC) 01/01/2016   Acute coronary syndrome (HCC) 12/31/2015   Cocaine  abuse (HCC) 12/31/2015   Leukocytosis 12/31/2015    ONSET DATE: 04/29/2024 referral date; 1128/25-03/29/24 hospitlaization  REFERRING DIAG: P36.487 (ICD-10-CM) - Cerebral infarction due to unspecified occlusion or stenosis of left middle cerebral artery I69.351 (ICD-10-CM) - 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 side I69.323 (ICD-10-CM) - Fluency disorder following cerebral infarction G81.91 (ICD-10-CM) - Hemiplegia, unspecified affecting right dominant side  THERAPY DIAG:  Other symptoms and signs involving the musculoskeletal system  Other lack of coordination  Frontal lobe and executive function deficit  Muscle weakness (generalized)  Other symptoms and signs involving the nervous system  Rationale for Evaluation and Treatment: Rehabilitation  SUBJECTIVE:   SUBJECTIVE STATEMENT: Pt reports being able to open up a bottle by herself.  Pt accompanied by: significant other  who waited in lobby  PERTINENT HISTORY: MRI showed: acute small vessel bland infarct in the left periventricular white matter extending to the posterior limb of the left internal capsule. Moderate cerebral white matter disease. PMHx: suicidal, HTN, HLD, CAD, cocaine  abuse, tobacco abuse, MI   PRECAUTIONS: Fall and Other: R sided weakness, substance abuse, cardiac stent (not recent)  WEIGHT BEARING RESTRICTIONS: No  PAIN:  Are you having pain? No  FALLS: Has patient fallen in last 6 months? No  LIVING ENVIRONMENT: Lives with: lives with their spouse, recently got an emotional support dog as well  Lives in: House/apartment Stairs: Yes: External: 1 steps; none Has following equipment at home: Vannie - 4 wheeled, Wheelchair (manual), shower chair, and Grab bars, handheld shower nozzle  PLOF: Independent, was cooking, cleaning and driving before.  PATIENT  GOALS: To return function in my right hand and get back to how I was.  OBJECTIVE:  Note: Objective measures were completed at Evaluation unless otherwise noted.  HAND DOMINANCE: Right, but having to use L hand d/t R sided weakness  ADLs: Overall ADLs: Fairly independent with the exception of dressing Transfers/ambulation related to ADLs: I/Mod I Eating: Independent Grooming: Wears wigs, can brush teeth but a little difficulty applying makeup. UB Dressing: Requires A donning R side. LB Dressing: Requires A with socks Toileting: Independent, but some difficulty when she has to void late at night d/t it feels like my muscles aren't awake yet.  Bathing: Mod I Tub Shower transfers: Mod I Equipment: Shower seat with back and handheld nozzle  IADLs: Shopping: None at this time Light housekeeping: Pt completing some light housekeeping including cleaning toilets. Meal Prep: Husband assisting Community mobility: not cleared to drive at this time Medication management: Pt completes but has difficulty opening up pill bottles Financial management: Pt and s/o share responsibility. Handwriting: unable  MOBILITY STATUS: Needs Assist: using 4ww   POSTURE COMMENTS:  No Significant postural limitations Sitting balance: WFL  ACTIVITY TOLERANCE: Activity tolerance: Pt reports it depends on the situation, if things make her nervous or things are high emotion she gets very tired.   FUNCTIONAL OUTCOME MEASURES: Upper Extremity  Functional Scale (UEFS): 39/80, functioning at 48.75%, 51.25% impairment  UPPER EXTREMITY ROM:  LUE ROM WNL, passive RUE ROM WFL with exception of shoulder flexion, pain at approximately 120 degrees.  Active ROM Right eval Left eval  Shoulder flexion    Shoulder abduction    Shoulder adduction    Shoulder extension    Shoulder internal rotation    Shoulder external rotation    Elbow flexion    Elbow extension    Wrist flexion    Wrist extension    Wrist ulnar  deviation    Wrist radial deviation    Wrist pronation    Wrist supination    (Blank rows = not tested)  UPPER EXTREMITY MMT:   LUE WNL  MMT Right eval Left eval  Shoulder flexion    Shoulder abduction    Shoulder adduction    Shoulder extension    Shoulder internal rotation    Shoulder external rotation    Middle trapezius    Lower trapezius    Elbow flexion    Elbow extension    Wrist flexion    Wrist extension    Wrist ulnar deviation    Wrist radial deviation    Wrist pronation    Wrist supination    (Blank rows = not tested)  HAND FUNCTION: Grip strength: Right: NT lbs; Left: 54.5 avg lbs (Lt=56.2, 54.2, 53.1) COORDINATION: 9 Hole Peg test: Right: NT sec; Left: 30.14 sec Box and Blocks:  Right NTblocks, Left 63blocks  SENSATION: Pt reports no changes in sensation, but sometimes her fingers are stiff or sore. WFL  EDEMA: Pt reported she had some before, but none now.  MUSCLE TONE: RUE: Hypotonic and Flaccid  COGNITION: Overall cognitive status: Impaired: Areas of impairment:  Behavior: does get emotional at ttimes with difficulty in speech.   VISION: Subjective report: my eyes have always been bad, I haven't had my glasses on during the stroke. But it's okay now. Baseline vision: Wears glasses all the time Visual history: none  VISION ASSESSMENT: Not tested  Patient has difficulty with following activities due to following visual impairments: none  PERCEPTION: WFL  PRAXIS: Not tested  OBSERVATIONS: hemiplegic/hemiparetic R side, difficulty finding words, difficulty completing ADLs and IADLs, some emotional lability (pt becomes tearful easily).                                                                                                                             TREATMENT DATE:  - Self-care/home management completed for duration as noted below including: Educated pt in use of sock aide for improved independence with donning/doffing footwear.  Provided visual demo and verbal instruction, suggested modification of placing sock aide lower between knees to allow BLE to hold one end of sock in place. Pt returned demo with good understanding, min cues for adjusting sock.  Educated pt in benefits of kinesiotaping, pt reported having been taped before and denied no known skin allergies. Utilized rock  tape and completed taping of L shoulder. Skin prepped with Cavilon for improved adhesion. 3 I strips cut, 1 anchor piece and 1 piece at anterior delt and 1 piece at posterior delt, these 2 pieces at 50% pull. Pt reported improved comfort.  Finally educated pt in sleep hygiene to promote a good night's sleep for optimal recovery s/p stroke. See Pt instructions for handout provided.     PATIENT EDUCATION: Education details: see above Person educated: Patient Education method: Explanation and Handouts Education comprehension: verbalized understanding and needs further education  HOME EXERCISE PROGRAM: 05/07/24: sleep positioning   GOALS: Goals reviewed with patient? Yes  SHORT TERM GOALS: Target date: 06/07/24  Pt will verbalize understanding of adapted strategies and/or equipment PRN to increase safety and independence with ADLs and IADLs (I.e. sock aide, shoe horn, dressing stick, elastic shoelaces)  Baseline: New to OP OT Goal status: INITIAL  2. Patient will demonstrate initial R UE HEP with 25% verbal cues or less for proper execution. Baseline: New to outpt OT Goal status: INITIAL   3.  Patient will demonstrate improved function in R hand by being able to form a gross composite fist Baseline: unable Goal status: INITIAL    4.  Patient will independently recall at least 3 energy conservation principles in relation to ADLs to increase functional independence.  Baseline: New to OP OT Goal status: INITIAL  5. Pt will report use of at least 2 emotional regulation/coping techniques (ie: meditation, deep breathing) Baseline: New  to OP OT Goal status: INITIAL    LONG TERM GOALS: Target date: 08/05/24  Pt will demonstrate improved function as evidenced by an increase in UEFS by at least 10 points Baseline: 39/80 Goal status: INITIAL  2. Pt will demonstrate improved function in R hand by a grip strength score of at least 10 pounds Baseline: 0 Goal status: INITIAL  3.  Pt will demonstrate improved function of RUE by flexing R shoulder to 90 degrees without compensatory movement Baseline: <90 degrees with compensation Goal status: INITIAL  4.  Pt will complete dressing tasks with Independence or Mod I.  Baseline: Min A, considerate difficulty with socks Goal status: INITIAL  ASSESSMENT:  CLINICAL IMPRESSION: Patient is a 64 y.o. female who was seen today for occupational therapy tx for acute ischemic stroke. Hx includes HTN, HLD, CAD, cocaine  abuse, tobacco abuse, MI, suicidal thoughts. Pt receptive to AE education and reported increased comfort in L shoulder with rock tape. Patient currently presents below baseline level of functioning demonstrating functional deficits and impairments as noted below. Pt would benefit from skilled OT services in the outpatient setting to work on impairments as noted below to help pt return to PLOF as able.     PERFORMANCE DEFICITS: in functional skills including ADLs, IADLs, coordination, dexterity, ROM, strength, pain, muscle spasms, Fine motor control, Gross motor control, cardiopulmonary status limiting function, decreased knowledge of use of DME, and UE functional use, cognitive skills including emotional, safety awareness, temperament/personality, thought, and understand, and psychosocial skills including coping strategies, environmental adaptation, habits, interpersonal interactions, and routines and behaviors.   IMPAIRMENTS: are limiting patient from ADLs, IADLs, rest and sleep, leisure, and social participation.   CO-MORBIDITIES: may have co-morbidities  that affects  occupational performance. Patient will benefit from skilled OT to address above impairments and improve overall function.  MODIFICATION OR ASSISTANCE TO COMPLETE EVALUATION: Min-Moderate modification of tasks or assist with assess necessary to complete an evaluation.  OT OCCUPATIONAL PROFILE AND HISTORY: Detailed assessment: Review of  records and additional review of physical, cognitive, psychosocial history related to current functional performance.  CLINICAL DECISION MAKING: Moderate - several treatment options, min-mod task modification necessary  REHAB POTENTIAL: Fair evidenced by motivation to participate, however hx of substance abuse may affect rehab potential  EVALUATION COMPLEXITY: Moderate    PLAN:  OT FREQUENCY: 2x/week  OT DURATION: 8 weeks  PLANNED INTERVENTIONS: 97168 OT Re-evaluation, 97535 self care/ADL training, 02889 therapeutic exercise, 97530 therapeutic activity, 97112 neuromuscular re-education, 97140 manual therapy, passive range of motion, psychosocial skills training, energy conservation, coping strategies training, patient/family education, and DME and/or AE instructions  RECOMMENDED OTHER SERVICES: May benefit from speech eval to work on finding words  CONSULTED AND AGREED WITH PLAN OF CARE: Patient  PLAN FOR NEXT SESSION: WB activities AE education (shoe horn) Sleep hygiene education   For all possible CPT codes, reference the Planned Interventions line above.     Check all conditions that are expected to impact treatment: {Conditions expected to impact treatment:Contractures, spasticity or fracture relevant to requested treatment and Social determinants of health   If treatment provided at initial evaluation, no treatment charged due to lack of authorization.      Rocky Dutch, OT 05/14/2024, 12:22 PM           "

## 2024-05-14 NOTE — Patient Instructions (Signed)
 Continue working with OT as scheduled, start PT and speech therapy as needed   Start baclofen  1 tablet up to three times daily as needed  Highly encourage complete tobacco cessation as continued use greatly increases risk of recurrent strokes. Try to decrease gradually over time. If furhter assistance is needed, please discuss further with your PCP  Continue aspirin  81 mg daily and clopidogrel  75 mg daily  and atorvastatin   for secondary stroke prevention  Continue to follow up with PCP regarding blood pressure, cholesterol and diabetes management  Maintain strict control of hypertension with blood pressure goal below 130/90, diabetes with hemoglobin A1c goal below 7.0 % and cholesterol with LDL cholesterol (bad cholesterol) goal below 70 mg/dL.   Signs of a Stroke? Follow the BEFAST method:  Balance Watch for a sudden loss of balance, trouble with coordination or vertigo Eyes Is there a sudden loss of vision in one or both eyes? Or double vision?  Face: Ask the person to smile. Does one side of the face droop or is it numb?  Arms: Ask the person to raise both arms. Does one arm drift downward? Is there weakness or numbness of a leg? Speech: Ask the person to repeat a simple phrase. Does the speech sound slurred/strange? Is the person confused ? Time: If you observe any of these signs, call 911.    Followup in the future with me in 3-4 months  or call earlier if needed       Thank you for coming to see us  at [facility]. I hope we have been able to provide you high quality care today.  You may receive a patient satisfaction survey over the next few weeks. We would appreciate your feedback and comments so that we may continue to improve ourselves and the health of our patients.

## 2024-05-16 NOTE — Progress Notes (Signed)
 I agree with the above plan

## 2024-05-17 ENCOUNTER — Other Ambulatory Visit: Payer: Self-pay

## 2024-05-17 ENCOUNTER — Ambulatory Visit

## 2024-05-17 ENCOUNTER — Ambulatory Visit: Admitting: Physical Therapy

## 2024-05-17 ENCOUNTER — Encounter: Payer: Self-pay | Admitting: Physical Therapy

## 2024-05-17 VITALS — BP 134/75 | HR 69

## 2024-05-17 DIAGNOSIS — R278 Other lack of coordination: Secondary | ICD-10-CM

## 2024-05-17 DIAGNOSIS — R29818 Other symptoms and signs involving the nervous system: Secondary | ICD-10-CM

## 2024-05-17 DIAGNOSIS — M6281 Muscle weakness (generalized): Secondary | ICD-10-CM

## 2024-05-17 DIAGNOSIS — I69351 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 side: Secondary | ICD-10-CM

## 2024-05-17 DIAGNOSIS — R29898 Other symptoms and signs involving the musculoskeletal system: Secondary | ICD-10-CM

## 2024-05-17 DIAGNOSIS — Z9181 History of falling: Secondary | ICD-10-CM

## 2024-05-17 DIAGNOSIS — R2689 Other abnormalities of gait and mobility: Secondary | ICD-10-CM

## 2024-05-17 DIAGNOSIS — R41844 Frontal lobe and executive function deficit: Secondary | ICD-10-CM

## 2024-05-17 NOTE — Therapy (Signed)
 " OUTPATIENT OCCUPATIONAL THERAPY NEURO TREATMENT  Patient Name: Natasha Roach MRN: [id number] DOB:1960/05/06, 64 y.o., female Today's Date: 05/17/2024  PCP: Catalina Bare, MD REFERRING PROVIDER: Lenetta Dover, MD  END OF SESSION:  OT End of Session - 05/17/24 1034     Visit Number 3    Number of Visits 17   including eval   Date for Recertification  08/05/24    Authorization Type [employer] MCD    Authorization Time Period 1/19-3/13/26    Authorization - Visit Number 2    Authorization - Number of Visits 16    OT Start Time 0935    OT Stop Time 1020    OT Time Calculation (min) 45 min    Equipment Utilized During Treatment mat table, boom wacker, washcloth robot glove    Activity Tolerance Patient tolerated treatment well    Behavior During Therapy WFL for tasks assessed/performed           Past Medical History:  Diagnosis Date   CAD (coronary artery disease) 04/23/2016   a. 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 b. STEMI 03/2016 due to cocaine , occ mLAD s/p Synergy stent, EF 40% by cath, 55% by echo.   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 at time of STEMI 03/2016.   Past Surgical History:  Procedure Laterality Date   ABDOMINAL HYSTERECTOMY     BREAST SURGERY     abcess   CARDIAC CATHETERIZATION  2004   CARDIAC CATHETERIZATION N/A 04/21/2016   Procedure: Left Heart Cath and Coronary Angiography;  Surgeon: Debby DELENA Sor, MD;  Location: [facility] INVASIVE CV LAB;  Service: Cardiovascular;  Laterality: N/A;   CARDIAC CATHETERIZATION N/A 04/21/2016   Procedure: Coronary Stent Intervention;  Surgeon:  Debby DELENA Sor, MD;  Location: [facility] INVASIVE CV LAB;  Service: Cardiovascular;  Laterality: N/A;   CORONARY STENT INTERVENTION N/A 10/27/2017   Procedure: CORONARY STENT INTERVENTION;  Surgeon: Jordan, Peter M, MD;  Location: [facility] INVASIVE CV LAB;  Service: Cardiovascular;  Laterality: N/A;   CORONARY/GRAFT ACUTE MI REVASCULARIZATION N/A 10/27/2017   Procedure: Coronary/Graft Acute MI Revascularization;  Surgeon: Jordan, Peter M, MD;  Location: [facility] INVASIVE CV LAB;  Service: Cardiovascular;  Laterality: N/A;   CORONARY/GRAFT ACUTE MI REVASCULARIZATION N/A 01/16/2018   Procedure: Coronary/Graft Acute MI Revascularization;  Surgeon: Jordan, Peter M, MD;  Location: [facility] INVASIVE CV LAB;  Service: Cardiovascular;  Laterality: N/A;   ELECTROPHYSIOLOGIC STUDY N/A 04/21/2016   Procedure: Cardioversion;  Surgeon: Debby DELENA Sor, MD;  Location: [facility] INVASIVE CV LAB;  Service: Cardiovascular;  Laterality: N/A;   LEFT HEART CATH AND CORONARY ANGIOGRAPHY N/A 10/27/2017   Procedure: LEFT HEART CATH AND CORONARY ANGIOGRAPHY;  Surgeon: Jordan, Peter M, MD;  Location: [facility] INVASIVE CV LAB;  Service: Cardiovascular;  Laterality: N/A;   LEFT HEART CATH AND CORONARY ANGIOGRAPHY N/A 01/16/2018   Procedure: LEFT HEART CATH AND CORONARY ANGIOGRAPHY;  Surgeon: Jordan, Peter M, MD;  Location: [name] INVASIVE CV LAB;  Service: Cardiovascular;  Laterality: N/A;   Patient Active Problem List   Diagnosis Date Noted   Acute CVA (  cerebrovascular accident) (HCC) 03/23/2024   Acute ST elevation myocardial infarction (STEMI) involving left anterior descending (LAD) coronary artery (HCC) 10/27/2017   Prolonged QT interval 04/23/2016   CAD (coronary artery disease) 04/23/2016   H/O noncompliance with medical treatment, presenting hazards to health 04/23/2016   Hyperlipidemia LDL goal <70    Ischemic cardiomyopathy    Acute ST elevation myocardial infarction (STEMI) of anterior wall (HCC) 04/21/2016   HTN (hypertension) 04/21/2016   Tobacco abuse  04/21/2016   Ventricular tachycardia, sustained (HCC) 04/21/2016   Morbid obesity (HCC) 01/01/2016   Acute coronary syndrome (HCC) 12/31/2015   Cocaine  abuse (HCC) 12/31/2015   Leukocytosis 12/31/2015    ONSET DATE: 04/29/2024 referral date; 1128/25-03/29/24 hospitlaization  REFERRING DIAG: P36.487 (ICD-10-CM) - Cerebral infarction due to unspecified occlusion or stenosis of left middle cerebral artery I69.351 (ICD-10-CM) - 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 side I69.323 (ICD-10-CM) - Fluency disorder following cerebral infarction G81.91 (ICD-10-CM) - Hemiplegia, unspecified affecting right dominant side  THERAPY DIAG:  Other symptoms and signs involving the musculoskeletal system  Muscle weakness (generalized)  Other lack of coordination  Other symptoms and signs involving the nervous system  Frontal lobe and executive function deficit  Rationale for Evaluation and Treatment: Rehabilitation  SUBJECTIVE:   SUBJECTIVE STATEMENT: I took the tape off yesterday but it was very helpful. Pt accompanied by: significant other  who waited in lobby  PERTINENT HISTORY: MRI showed: acute small vessel bland infarct in the left periventricular white matter extending to the posterior limb of the left internal capsule. Moderate cerebral white matter disease. PMHx: suicidal, HTN, HLD, CAD, cocaine  abuse, tobacco abuse, MI   PRECAUTIONS: Fall and Other: R sided weakness, substance abuse, cardiac stent (not recent)  WEIGHT BEARING RESTRICTIONS: No  PAIN:  Are you having pain? No  FALLS: Has patient fallen in last 6 months? No  LIVING ENVIRONMENT: Lives with: lives with their spouse, recently got an emotional support dog as well  Lives in: House/apartment Stairs: Yes: External: 1 steps; none Has following equipment at home: Vannie - 4 wheeled, Wheelchair (manual), shower chair, and Grab bars, handheld shower nozzle  PLOF: Independent, was  cooking, cleaning and driving before.  PATIENT GOALS: To return function in my right hand and get back to how I was.  OBJECTIVE:  Note: Objective measures were completed at Evaluation unless otherwise noted.  HAND DOMINANCE: Right, but having to use L hand d/t R sided weakness  ADLs: Overall ADLs: Fairly independent with the exception of dressing Transfers/ambulation related to ADLs: I/Mod I Eating: Independent Grooming: Wears wigs, can brush teeth but a little difficulty applying makeup. UB Dressing: Requires A donning R side. LB Dressing: Requires A with socks Toileting: Independent, but some difficulty when she has to void late at night d/t it feels like my muscles aren't awake yet.  Bathing: Mod I Tub Shower transfers: Mod I Equipment: Shower seat with back and handheld nozzle  IADLs: Shopping: None at this time Light housekeeping: Pt completing some light housekeeping including cleaning toilets. Meal Prep: Husband assisting Community mobility: not cleared to drive at this time Medication management: Pt completes but has difficulty opening up pill bottles Financial management: Pt and s/o share responsibility. Handwriting: unable  MOBILITY STATUS: Needs Assist: using 4ww   POSTURE COMMENTS:  No Significant postural limitations Sitting balance: WFL  ACTIVITY TOLERANCE: Activity tolerance: Pt reports it depends on the situation, if things make her nervous or things are high emotion she gets very tired.  FUNCTIONAL OUTCOME MEASURES: Upper Extremity Functional Scale (UEFS): 39/80, functioning at 48.75%, 51.25% impairment  UPPER EXTREMITY ROM:  LUE ROM WNL, passive RUE ROM WFL with exception of shoulder flexion, pain at approximately 120 degrees.  Active ROM Right eval Left eval  Shoulder flexion    Shoulder abduction    Shoulder adduction    Shoulder extension    Shoulder internal rotation    Shoulder external rotation    Elbow flexion    Elbow extension     Wrist flexion    Wrist extension    Wrist ulnar deviation    Wrist radial deviation    Wrist pronation    Wrist supination    (Blank rows = not tested)  UPPER EXTREMITY MMT:   LUE WNL  MMT Right eval Left eval  Shoulder flexion    Shoulder abduction    Shoulder adduction    Shoulder extension    Shoulder internal rotation    Shoulder external rotation    Middle trapezius    Lower trapezius    Elbow flexion    Elbow extension    Wrist flexion    Wrist extension    Wrist ulnar deviation    Wrist radial deviation    Wrist pronation    Wrist supination    (Blank rows = not tested)  HAND FUNCTION: Grip strength: Right: NT lbs; Left: 54.5 avg lbs (Lt=56.2, 54.2, 53.1) COORDINATION: 9 Hole Peg test: Right: NT sec; Left: 30.14 sec Box and Blocks:  Right NTblocks, Left 63blocks  SENSATION: Pt reports no changes in sensation, but sometimes her fingers are stiff or sore. WFL  EDEMA: Pt reported she had some before, but none now.  MUSCLE TONE: RUE: Hypotonic and Flaccid  COGNITION: Overall cognitive status: Impaired: Areas of impairment:  Behavior: does get emotional at ttimes with difficulty in speech.   VISION: Subjective report: my eyes have always been bad, I haven't had my glasses on during the stroke. But it's okay now. Baseline vision: Wears glasses all the time Visual history: none  VISION ASSESSMENT: Not tested  Patient has difficulty with following activities due to following visual impairments: none  PERCEPTION: WFL  PRAXIS: Not tested  OBSERVATIONS: hemiplegic/hemiparetic R side, difficulty finding words, difficulty completing ADLs and IADLs, some emotional lability (pt becomes tearful easily).                                                                                                                             TREATMENT DATE:   - Therapeutic exercises completed for duration as noted below including: Educatd in shoulder flexion gravity  eliminated towel slides to promote improved ROM in RUE for carryover with functional tasks. Transferred to mat table and completed supine shoulder flexion and abduction with OT stabilizing R hand and elbow. Pt instructed in importance of respecting pain in shoulder.  - Therapeutic activities completed for duration as noted below including:  Set up pt's R hand in robot  glove to allow for assisted grasp and release. Pt requested education on where to obtain.     PATIENT EDUCATION: Education details: aeronautical engineer use Person educated: Patient Education method: Explanation and Handouts Education comprehension: verbalized understanding and needs further education  HOME EXERCISE PROGRAM: 05/07/24: sleep positioning   GOALS: Goals reviewed with patient? Yes  SHORT TERM GOALS: Target date: 06/07/24  Pt will verbalize understanding of adapted strategies and/or equipment PRN to increase safety and independence with ADLs and IADLs (I.e. sock aide, shoe horn, dressing stick, elastic shoelaces)  Baseline: New to OP OT Goal status: INITIAL  2. Patient will demonstrate initial R UE HEP with 25% verbal cues or less for proper execution. Baseline: New to outpt OT Goal status: INITIAL   3.  Patient will demonstrate improved function in R hand by being able to form a gross composite fist Baseline: unable Goal status: INITIAL    4.  Patient will independently recall at least 3 energy conservation principles in relation to ADLs to increase functional independence.  Baseline: New to OP OT Goal status: INITIAL  5. Pt will report use of at least 2 emotional regulation/coping techniques (ie: meditation, deep breathing) Baseline: New to OP OT Goal status: INITIAL    LONG TERM GOALS: Target date: 08/05/24  Pt will demonstrate improved function as evidenced by an increase in UEFS by at least 10 points Baseline: 39/80 Goal status: INITIAL  2. Pt will demonstrate improved function in R hand by a  grip strength score of at least 10 pounds Baseline: 0 Goal status: INITIAL  3.  Pt will demonstrate improved function of RUE by flexing R shoulder to 90 degrees without compensatory movement Baseline: <90 degrees with compensation Goal status: INITIAL  4.  Pt will complete dressing tasks with Independence or Mod I.  Baseline: Min A, considerate difficulty with socks Goal status: INITIAL  ASSESSMENT:  CLINICAL IMPRESSION: Patient is a 64 y.o. female who was seen today for occupational therapy tx for acute ischemic stroke. Hx includes HTN, HLD, CAD, cocaine  abuse, tobacco abuse, MI, suicidal thoughts. Pt excited with robot glove use, education was required to respect pain in shoulder to reduce risk of injury. Pt would benefit from skilled OT services in the outpatient setting to work on impairments as noted below to help pt return to PLOF as able.     PERFORMANCE DEFICITS: in functional skills including ADLs, IADLs, coordination, dexterity, ROM, strength, pain, muscle spasms, Fine motor control, Gross motor control, cardiopulmonary status limiting function, decreased knowledge of use of DME, and UE functional use, cognitive skills including emotional, safety awareness, temperament/personality, thought, and understand, and psychosocial skills including coping strategies, environmental adaptation, habits, interpersonal interactions, and routines and behaviors.   IMPAIRMENTS: are limiting patient from ADLs, IADLs, rest and sleep, leisure, and social participation.   CO-MORBIDITIES: may have co-morbidities  that affects occupational performance. Patient will benefit from skilled OT to address above impairments and improve overall function.  MODIFICATION OR ASSISTANCE TO COMPLETE EVALUATION: Min-Moderate modification of tasks or assist with assess necessary to complete an evaluation.  OT OCCUPATIONAL PROFILE AND HISTORY: Detailed assessment: Review of records and additional review of physical,  cognitive, psychosocial history related to current functional performance.  CLINICAL DECISION MAKING: Moderate - several treatment options, min-mod task modification necessary  REHAB POTENTIAL: Fair evidenced by motivation to participate, however hx of substance abuse may affect rehab potential  EVALUATION COMPLEXITY: Moderate    PLAN:  OT FREQUENCY: 2x/week  OT DURATION: 8 weeks  PLANNED INTERVENTIONS: 97168 OT Re-evaluation, 97535 self care/ADL training, 02889 therapeutic exercise, 97530 therapeutic activity, 97112 neuromuscular re-education, 97140 manual therapy, passive range of motion, psychosocial skills training, energy conservation, coping strategies training, patient/family education, and DME and/or AE instructions  RECOMMENDED OTHER SERVICES: May benefit from speech eval to work on finding words  CONSULTED AND AGREED WITH PLAN OF CARE: Patient  PLAN FOR NEXT SESSION: WB activities AE education (shoe horn) Print PROM handout (table slides, supine)   For all possible CPT codes, reference the Planned Interventions line above.     Check all conditions that are expected to impact treatment: {Conditions expected to impact treatment:Contractures, spasticity or fracture relevant to requested treatment and Social determinants of health   If treatment provided at initial evaluation, no treatment charged due to lack of authorization.      Rocky Dutch, OT 05/17/2024, 11:41 AM           "

## 2024-05-21 ENCOUNTER — Ambulatory Visit: Admitting: Physical Therapy

## 2024-05-21 ENCOUNTER — Ambulatory Visit

## 2024-05-21 ENCOUNTER — Telehealth: Payer: Self-pay

## 2024-05-21 NOTE — Telephone Encounter (Signed)
 Pt no-showed for OT appointment today. Therefore, OT called pt's listed phone number [phone]). Pt answered and reported some confusion with appts, stated that she had called but could not get in touch with front desk and that the ansewring machine said appts before 11 had to be rescheduled. Pt also reported that even if there was no confusion with appts today she wouldn't be able to come d/t weather. Pt informed of next PT and OT appts on 05/28/24, pt agreed to attend weather allowing.   Rocky Dutch, OTR/L

## 2024-05-28 ENCOUNTER — Ambulatory Visit: Admitting: Physical Therapy

## 2024-05-28 ENCOUNTER — Ambulatory Visit

## 2024-05-28 ENCOUNTER — Telehealth: Payer: Self-pay | Admitting: Physical Therapy

## 2024-05-28 ENCOUNTER — Ambulatory Visit: Admitting: Occupational Therapy

## 2024-05-28 NOTE — Telephone Encounter (Signed)
 This is to document my attempt to call patient after no-show for PT appt this AM.  This is patient's # 2 missed appt (pt missed last appt d/t weather).   Primary phone number(s) was used in efforts to contact the patient.   Spoke to patient and reminded pt of scheduled PT, OT, and SLP appts today; pt reports she received an e-mail that she had appts this Friday but not any today (pt reports unable to make any therapy appts today and will call front desk to cancel rest of appts for today).  Pt reminded of upcoming scheduled therapy visit.  Damien Caulk, PT 05/28/24, 9:22 AM

## 2024-05-31 ENCOUNTER — Ambulatory Visit: Admitting: Physical Therapy

## 2024-05-31 ENCOUNTER — Ambulatory Visit

## 2024-05-31 ENCOUNTER — Encounter: Payer: Self-pay | Admitting: Physical Therapy

## 2024-05-31 VITALS — BP 132/64 | HR 61

## 2024-05-31 DIAGNOSIS — R29818 Other symptoms and signs involving the nervous system: Secondary | ICD-10-CM

## 2024-05-31 DIAGNOSIS — M6281 Muscle weakness (generalized): Secondary | ICD-10-CM

## 2024-05-31 DIAGNOSIS — I69351 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 side: Secondary | ICD-10-CM

## 2024-05-31 DIAGNOSIS — R29898 Other symptoms and signs involving the musculoskeletal system: Secondary | ICD-10-CM

## 2024-05-31 DIAGNOSIS — R2689 Other abnormalities of gait and mobility: Secondary | ICD-10-CM

## 2024-05-31 DIAGNOSIS — R278 Other lack of coordination: Secondary | ICD-10-CM

## 2024-05-31 DIAGNOSIS — Z9181 History of falling: Secondary | ICD-10-CM

## 2024-05-31 NOTE — Therapy (Signed)
 " OUTPATIENT OCCUPATIONAL THERAPY NEURO TREATMENT  Patient Name: Natasha Roach MRN: [id number] DOB:[birth year], 64 y.o., female Today's Date: 05/31/2024  PCP: Catalina Bare, MD REFERRING PROVIDER: Lenetta Dover, MD  END OF SESSION:  OT End of Session - 05/31/24 0936     Visit Number 4    Number of Visits 17   including eval   Date for Recertification  08/05/24    Authorization Type [employer] MCD    Authorization Time Period 1/19-3/13/26    Authorization - Visit Number 3    Authorization - Number of Visits 16    OT Start Time 0934    OT Stop Time 1031    OT Time Calculation (min) 57 min    Equipment Utilized During Treatment mat table    Activity Tolerance Patient tolerated treatment well    Behavior During Therapy WFL for tasks assessed/performed           Past Medical History:  Diagnosis Date   CAD (coronary artery disease) 04/23/2016   a. 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 b. STEMI 03/2016 due to cocaine , occ mLAD s/p Synergy stent, EF 40% by cath, 55% by echo.   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 at time of STEMI 03/2016.   Past Surgical History:  Procedure Laterality Date   ABDOMINAL HYSTERECTOMY     BREAST SURGERY     abcess   CARDIAC CATHETERIZATION  2004   CARDIAC CATHETERIZATION N/A 04/21/2016   Procedure: Left Heart Cath and Coronary Angiography;  Surgeon: Debby DELENA Sor, MD;  Location: [facility] INVASIVE CV LAB;  Service: Cardiovascular;  Laterality: N/A;   CARDIAC CATHETERIZATION N/A 04/21/2016   Procedure: Coronary Stent Intervention;  Surgeon: Debby DELENA Sor, MD;  Location: [facility]  INVASIVE CV LAB;  Service: Cardiovascular;  Laterality: N/A;   CORONARY STENT INTERVENTION N/A 10/27/2017   Procedure: CORONARY STENT INTERVENTION;  Surgeon: Jordan, Peter M, MD;  Location: [facility] INVASIVE CV LAB;  Service: Cardiovascular;  Laterality: N/A;   CORONARY/GRAFT ACUTE MI REVASCULARIZATION N/A 10/27/2017   Procedure: Coronary/Graft Acute MI Revascularization;  Surgeon: Jordan, Peter M, MD;  Location: [facility] INVASIVE CV LAB;  Service: Cardiovascular;  Laterality: N/A;   CORONARY/GRAFT ACUTE MI REVASCULARIZATION N/A 01/16/2018   Procedure: Coronary/Graft Acute MI Revascularization;  Surgeon: Jordan, Peter M, MD;  Location: [facility] INVASIVE CV LAB;  Service: Cardiovascular;  Laterality: N/A;   ELECTROPHYSIOLOGIC STUDY N/A 04/21/2016   Procedure: Cardioversion;  Surgeon: Debby DELENA Sor, MD;  Location: [facility] INVASIVE CV LAB;  Service: Cardiovascular;  Laterality: N/A;   LEFT HEART CATH AND CORONARY ANGIOGRAPHY N/A 10/27/2017   Procedure: LEFT HEART CATH AND CORONARY ANGIOGRAPHY;  Surgeon: Jordan, Peter M, MD;  Location: [facility] INVASIVE CV LAB;  Service: Cardiovascular;  Laterality: N/A;   LEFT HEART CATH AND CORONARY ANGIOGRAPHY N/A 01/16/2018   Procedure: LEFT HEART CATH AND CORONARY ANGIOGRAPHY;  Surgeon: Jordan, Peter M, MD;  Location: [facility] INVASIVE CV LAB;  Service: Cardiovascular;  Laterality: N/A;   Patient Active Problem List   Diagnosis Date Noted   Acute CVA (cerebrovascular accident) (HCC) 03/23/2024  Acute ST elevation myocardial infarction (STEMI) involving left anterior descending (LAD) coronary artery (HCC) 10/27/2017   Prolonged QT interval 04/23/2016   CAD (coronary artery disease) 04/23/2016   H/O noncompliance with medical treatment, presenting hazards to health 04/23/2016   Hyperlipidemia LDL goal <70    Ischemic cardiomyopathy    Acute ST elevation myocardial infarction (STEMI) of anterior wall (HCC) 04/21/2016   HTN (hypertension) 04/21/2016   Tobacco abuse 04/21/2016   Ventricular  tachycardia, sustained (HCC) 04/21/2016   Morbid obesity (HCC) 01/01/2016   Acute coronary syndrome (HCC) 12/31/2015   Cocaine  abuse (HCC) 12/31/2015   Leukocytosis 12/31/2015    ONSET DATE: 04/29/2024 referral date; 1128/25-03/29/24 hospitlaization  REFERRING DIAG: P36.487 (ICD-10-CM) - Cerebral infarction due to unspecified occlusion or stenosis of left middle cerebral artery I69.351 (ICD-10-CM) - 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 side I69.323 (ICD-10-CM) - Fluency disorder following cerebral infarction G81.91 (ICD-10-CM) - Hemiplegia, unspecified affecting right dominant side  THERAPY DIAG:  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 side (HCC)  Muscle weakness (generalized)  Other symptoms and signs involving the musculoskeletal system  Other symptoms and signs involving the nervous system  Other lack of coordination  Rationale for Evaluation and Treatment: Rehabilitation  SUBJECTIVE:   SUBJECTIVE STATEMENT: I haven't been doing the exercises like I'm supposed to. But I was able to move my index finger myself a few times on Sunday. Pt accompanied by: significant other  spouse Lewis  PERTINENT HISTORY: MRI showed: acute small vessel bland infarct in the left periventricular white matter extending to the posterior limb of the left internal capsule. Moderate cerebral white matter disease. PMHx: suicidal, HTN, HLD, CAD, cocaine  abuse, tobacco abuse, MI   PRECAUTIONS: Fall and Other: R sided weakness, substance abuse, cardiac stent (not recent)  WEIGHT BEARING RESTRICTIONS: No  PAIN:  Are you having pain? No  FALLS: Has patient fallen in last 6 months? No  LIVING ENVIRONMENT: Lives with: lives with their spouse, recently got an emotional support dog as well  Lives in: House/apartment Stairs: Yes: External: 1 steps; none Has following equipment at home: Vannie - 4 wheeled, Wheelchair  (manual), shower chair, and Grab bars, handheld shower nozzle  PLOF: Independent, was cooking, cleaning and driving before.  PATIENT GOALS: To return function in my right hand and get back to how I was.  OBJECTIVE:  Note: Objective measures were completed at Evaluation unless otherwise noted.  HAND DOMINANCE: Right, but having to use L hand d/t R sided weakness  ADLs: Overall ADLs: Fairly independent with the exception of dressing Transfers/ambulation related to ADLs: I/Mod I Eating: Independent Grooming: Wears wigs, can brush teeth but a little difficulty applying makeup. UB Dressing: Requires A donning R side. LB Dressing: Requires A with socks Toileting: Independent, but some difficulty when she has to void late at night d/t it feels like my muscles aren't awake yet.  Bathing: Mod I Tub Shower transfers: Mod I Equipment: Shower seat with back and handheld nozzle  IADLs: Shopping: None at this time Light housekeeping: Pt completing some light housekeeping including cleaning toilets. Meal Prep: Husband assisting Community mobility: not cleared to drive at this time Medication management: Pt completes but has difficulty opening up pill bottles Financial management: Pt and s/o share responsibility. Handwriting: unable  MOBILITY STATUS: Needs Assist: using 4ww   POSTURE COMMENTS:  No Significant postural limitations Sitting balance: WFL  ACTIVITY TOLERANCE: Activity tolerance: Pt reports it depends on the situation, if things make her  nervous or things are high emotion she gets very tired.   FUNCTIONAL OUTCOME MEASURES: Upper Extremity Functional Scale (UEFS): 39/80, functioning at 48.75%, 51.25% impairment  UPPER EXTREMITY ROM:  LUE ROM WNL, passive RUE ROM WFL with exception of shoulder flexion, pain at approximately 120 degrees.  Active ROM Right eval Left eval  Shoulder flexion    Shoulder abduction    Shoulder adduction    Shoulder extension    Shoulder  internal rotation    Shoulder external rotation    Elbow flexion    Elbow extension    Wrist flexion    Wrist extension    Wrist ulnar deviation    Wrist radial deviation    Wrist pronation    Wrist supination    (Blank rows = not tested)  UPPER EXTREMITY MMT:   LUE WNL  MMT Right eval Left eval  Shoulder flexion    Shoulder abduction    Shoulder adduction    Shoulder extension    Shoulder internal rotation    Shoulder external rotation    Middle trapezius    Lower trapezius    Elbow flexion    Elbow extension    Wrist flexion    Wrist extension    Wrist ulnar deviation    Wrist radial deviation    Wrist pronation    Wrist supination    (Blank rows = not tested)  HAND FUNCTION: Grip strength: Right: NT lbs; Left: 54.5 avg lbs (Lt=56.2, 54.2, 53.1) COORDINATION: 9 Hole Peg test: Right: NT sec; Left: 30.14 sec Box and Blocks:  Right NTblocks, Left 63blocks  SENSATION: Pt reports no changes in sensation, but sometimes her fingers are stiff or sore. WFL  EDEMA: Pt reported she had some before, but none now.  MUSCLE TONE: RUE: Hypotonic and Flaccid  COGNITION: Overall cognitive status: Impaired: Areas of impairment:  Behavior: does get emotional at ttimes with difficulty in speech.   VISION: Subjective report: my eyes have always been bad, I haven't had my glasses on during the stroke. But it's okay now. Baseline vision: Wears glasses all the time Visual history: none  VISION ASSESSMENT: Not tested  Patient has difficulty with following activities due to following visual impairments: none  PERCEPTION: WFL  PRAXIS: Not tested  OBSERVATIONS: hemiplegic/hemiparetic R side, difficulty finding words, difficulty completing ADLs and IADLs, some emotional lability (pt becomes tearful easily).                                                                                                                             TREATMENT DATE:  - Therapeutic exercises  completed for duration as noted below including: Pt attempted shoulder flexion towel slides at table, however reported pain. Completed pendulums while standing with SBA, min cues for proper form.   Applied heating pad to R shoulder to relieve pain, educated pt and spouse in benefits of heat for reducing stiffness and pain, and educated pt in importance of moving RUE  every day to reduce stiffness as well. Pt verbalized understanding.  Transfer to mat table SBA, completed supine shoulder flexion, elbow flexion, and shoulder internal/external rotation with dowel rod and with towel under pt's R elbow for extra cushioning/support and OT stabilizing R elbow during shoulder flexion, as well as stabilizing R hand to ensure grasp of dowel.   - Self-care/home management completed for duration as noted below including:  Attempted WB at table, however pt's R wrist is considerably stiff and painful to attempt to extend. Educated pt and spouse in possibility of fabricating either a resting hand splint or wrist cock up splint to help with improving R wrist extension. Also provided screen shot of robot glove per pt request.   Educated pt in shoe horn for improved ease in donning/doffing of shoe (more specifically L shoe d/t R shoe having brace for foot drop).    PATIENT EDUCATION: Education details: heat use for pain relief and stiffness reduction, ROM HEPs, shoe horn Person educated: Patient Education method: Programmer, Multimedia, Demonstration, Verbal cues, and Handouts Education comprehension: verbalized understanding, returned demonstration, and needs further education  HOME EXERCISE PROGRAM: 05/07/24: sleep positioning 05/31/24: ROM supine and pendulums (ACCESS CODE [id number])   GOALS: Goals reviewed with patient? Yes  SHORT TERM GOALS: Target date: 06/07/24  Pt will verbalize understanding of adapted strategies and/or equipment PRN to increase safety and independence with ADLs and IADLs (I.e. sock aide, shoe  horn, dressing stick, elastic shoelaces)  Baseline: New to OP OT Goal status: INITIAL  2. Patient will demonstrate initial R UE HEP with 25% verbal cues or less for proper execution. Baseline: New to outpt OT Goal status: INITIAL   3.  Patient will demonstrate improved function in R hand by being able to form a gross composite fist Baseline: unable Goal status: INITIAL    4.  Patient will independently recall at least 3 energy conservation principles in relation to ADLs to increase functional independence.  Baseline: New to OP OT Goal status: INITIAL  5. Pt will report use of at least 2 emotional regulation/coping techniques (ie: meditation, deep breathing) Baseline: New to OP OT Goal status: INITIAL    LONG TERM GOALS: Target date: 08/05/24  Pt will demonstrate improved function as evidenced by an increase in UEFS by at least 10 points Baseline: 39/80 Goal status: INITIAL  2. Pt will demonstrate improved function in R hand by a grip strength score of at least 10 pounds Baseline: 0 Goal status: INITIAL  3.  Pt will demonstrate improved function of RUE by flexing R shoulder to 90 degrees without compensatory movement Baseline: <90 degrees with compensation Goal status: INITIAL  4.  Pt will complete dressing tasks with Independence or Mod I.  Baseline: Min A, considerate difficulty with socks Goal status: INITIAL  ASSESSMENT:  CLINICAL IMPRESSION: Patient is a 64 y.o. female who was seen today for occupational therapy tx for acute ischemic stroke. Hx includes HTN, HLD, CAD, cocaine  abuse, tobacco abuse, MI, suicidal thoughts. Pt reports not completing PROM at home very often and reported increased pain in shoulder/ wrist, educated pt in importance of regular movement of RUE. Pt would benefit from skilled OT services in the outpatient setting to work on impairments as noted below to help pt return to PLOF as able.     PERFORMANCE DEFICITS: in functional skills including  ADLs, IADLs, coordination, dexterity, ROM, strength, pain, muscle spasms, Fine motor control, Gross motor control, cardiopulmonary status limiting function, decreased knowledge of use of DME, and UE  functional use, cognitive skills including emotional, safety awareness, temperament/personality, thought, and understand, and psychosocial skills including coping strategies, environmental adaptation, habits, interpersonal interactions, and routines and behaviors.   IMPAIRMENTS: are limiting patient from ADLs, IADLs, rest and sleep, leisure, and social participation.   CO-MORBIDITIES: may have co-morbidities  that affects occupational performance. Patient will benefit from skilled OT to address above impairments and improve overall function.  MODIFICATION OR ASSISTANCE TO COMPLETE EVALUATION: Min-Moderate modification of tasks or assist with assess necessary to complete an evaluation.  OT OCCUPATIONAL PROFILE AND HISTORY: Detailed assessment: Review of records and additional review of physical, cognitive, psychosocial history related to current functional performance.  CLINICAL DECISION MAKING: Moderate - several treatment options, min-mod task modification necessary  REHAB POTENTIAL: Fair evidenced by motivation to participate, however hx of substance abuse may affect rehab potential  EVALUATION COMPLEXITY: Moderate    PLAN:  OT FREQUENCY: 2x/week  OT DURATION: 8 weeks  PLANNED INTERVENTIONS: 97168 OT Re-evaluation, 97535 self care/ADL training, 02889 therapeutic exercise, 97530 therapeutic activity, 97112 neuromuscular re-education, 97140 manual therapy, passive range of motion, psychosocial skills training, energy conservation, coping strategies training, patient/family education, and DME and/or AE instructions  RECOMMENDED OTHER SERVICES: May benefit from speech eval to work on finding words  CONSULTED AND AGREED WITH PLAN OF CARE: Patient  PLAN FOR NEXT SESSION: WB activities Check  STG Monitor R shoulder/wrist pain Resting hand splint fabrication   For all possible CPT codes, reference the Planned Interventions line above.     Check all conditions that are expected to impact treatment: {Conditions expected to impact treatment:Contractures, spasticity or fracture relevant to requested treatment and Social determinants of health   If treatment provided at initial evaluation, no treatment charged due to lack of authorization.      Rocky Dutch, OT 05/31/2024, 11:27 AM           "

## 2024-05-31 NOTE — Therapy (Signed)
 " OUTPATIENT PHYSICAL THERAPY NEURO TREATMENT   Patient Name: Natasha Roach MRN: [id number] DOB:1960/07/18, 64 y.o., female Today's Date: 05/31/2024   PCP: Catalina Bare, MD REFERRING PROVIDER: Lenetta Dover, MD  END OF SESSION:  PT End of Session - 05/31/24 0849     Visit Number 2    Number of Visits 13   12 visits plus Eval   Date for Recertification  07/12/24    Authorization Type [facility] Medicaid    Authorization Time Period 6 visits (05/21/24-07/16/24)    Authorization - Visit Number 1    Authorization - Number of Visits 6   27   PT Start Time 0846    PT Stop Time 0933    PT Time Calculation (min) 47 min    Equipment Utilized During Treatment Gait belt    Activity Tolerance Patient tolerated treatment well    Behavior During Therapy Lability;Anxious   pt intermittently laughing         Past Medical History:  Diagnosis Date   CAD (coronary artery disease) 04/23/2016   a. 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 b. STEMI 03/2016 due to cocaine , occ mLAD s/p Synergy stent, EF 40% by cath, 55% by echo.   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 at time of STEMI 03/2016.   Past Surgical History:  Procedure Laterality Date   ABDOMINAL HYSTERECTOMY     BREAST SURGERY     abcess   CARDIAC CATHETERIZATION  2004   CARDIAC CATHETERIZATION N/A 04/21/2016   Procedure: Left Heart Cath and Coronary Angiography;  Surgeon: Debby DELENA Sor, MD;  Location: [facility] INVASIVE CV LAB;  Service: Cardiovascular;  Laterality: N/A;   CARDIAC CATHETERIZATION N/A 04/21/2016   Procedure: Coronary Stent Intervention;  Surgeon:  Debby DELENA Sor, MD;  Location: [facility] INVASIVE CV LAB;  Service: Cardiovascular;  Laterality: N/A;   CORONARY STENT INTERVENTION N/A 10/27/2017   Procedure: CORONARY STENT INTERVENTION;  Surgeon: Jordan, Peter M, MD;  Location: [facility] INVASIVE CV LAB;  Service: Cardiovascular;  Laterality: N/A;   CORONARY/GRAFT ACUTE MI REVASCULARIZATION N/A 10/27/2017   Procedure: Coronary/Graft Acute MI Revascularization;  Surgeon: Jordan, Peter M, MD;  Location: [facility] INVASIVE CV LAB;  Service: Cardiovascular;  Laterality: N/A;   CORONARY/GRAFT ACUTE MI REVASCULARIZATION N/A 01/16/2018   Procedure: Coronary/Graft Acute MI Revascularization;  Surgeon: Jordan, Peter M, MD;  Location: [facility] INVASIVE CV LAB;  Service: Cardiovascular;  Laterality: N/A;   ELECTROPHYSIOLOGIC STUDY N/A 04/21/2016   Procedure: Cardioversion;  Surgeon: Debby DELENA Sor, MD;  Location: [facility] INVASIVE CV LAB;  Service: Cardiovascular;  Laterality: N/A;   LEFT HEART CATH AND CORONARY ANGIOGRAPHY N/A 10/27/2017   Procedure: LEFT HEART CATH AND CORONARY ANGIOGRAPHY;  Surgeon: Jordan, Peter M, MD;  Location[facility] INVASIVE CV LAB;  Service: Cardiovascular;  Laterality: N/A;   LEFT HEART CATH AND CORONARY ANGIOGRAPHY N/A 01/16/2018   Procedure: LEFT HEART CATH AND CORONARY ANGIOGRAPHY;  Surgeon: Jordan, Peter M, MD;  Location: [facility] INVASIVE CV LAB;  Service: Cardiovascular;  Laterality: N/A;   Patient Active Problem List   Diagnosis Date Noted  Acute CVA (cerebrovascular accident) (HCC) 03/23/2024   Acute ST elevation myocardial infarction (STEMI) involving left anterior descending (LAD) coronary artery (HCC) 10/27/2017   Prolonged QT interval 04/23/2016   CAD (coronary artery disease) 04/23/2016   H/O noncompliance with medical treatment, presenting hazards to health 04/23/2016   Hyperlipidemia LDL goal <70    Ischemic cardiomyopathy    Acute ST elevation myocardial infarction (STEMI) of anterior wall (HCC) 04/21/2016   HTN (hypertension) 04/21/2016   Tobacco abuse  04/21/2016   Ventricular tachycardia, sustained (HCC) 04/21/2016   Morbid obesity (HCC) 01/01/2016   Acute coronary syndrome (HCC) 12/31/2015   Cocaine  abuse (HCC) 12/31/2015   Leukocytosis 12/31/2015    ONSET DATE: 04/11/2024 (date of referral)  REFERRING DIAG: I63.512 (ICD-10-CM) - Cerebral infarction due to unspecified occlusion or stenosis of left middle cerebral artery I69.351 (ICD-10-CM) - 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 side I69.323 (ICD-10-CM) - Fluency disorder following cerebral infarction G81.91 (ICD-10-CM) - Hemiplegia, unspecified affecting right dominant side  THERAPY DIAG:  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 side (HCC)  History of falling  Muscle weakness (generalized)  Other symptoms and signs involving the musculoskeletal system  Other symptoms and signs involving the nervous system  Other abnormalities of gait and mobility  Rationale for Evaluation and Treatment: Rehabilitation  SUBJECTIVE:  Likes to be called [name]                                                                                                                                                                                           SUBJECTIVE STATEMENT: 1 recent fall on Wednesday on ice (walking with [facility]); no injuries or pain reported.  Pt arrived in manual w/c (pushing with L UE and L LE) with her husband.  Pt reports calling paramedics yesterday d/t L sided head pain and eye pain; resolved prior to paramedics leaving; no current concerns/symptoms.  Pt wearing R solid AFO. Pt accompanied by: self and significant other (husband--[name]: stayed in waiting room)  PERTINENT HISTORY: Pt hospitalized 03/22/24-03/29/24 with R sided weakness, dysarthria, and facial droop; (+) acute CVA; UDS (+) cocaine  03/22/24.  Pt discharged to [facility] 03/29/24.  PMH includes CAD with STEMI 2017 s/p  LAD stent, ischemic cardiomyopathy with EF 40% 2017 (most recent echo normal EF), h/o noncompliance with medical therapies, htn, cocaine -induce MI, V-tach 2017.  Per notes h/o sciatica.  PAIN:  Are you having pain? No  PRECAUTIONS: Fall  RED FLAGS: None   WEIGHT BEARING RESTRICTIONS: No  FALLS: Has patient fallen in last 6 months? Yes. Number of falls 2 (fell up against wall  1x about 2 weeks ago; fell on ground 1x when trying to get to bathroom on own about 1 month ago  LIVING ENVIRONMENT: Lives with: lives with their spouse and recently got an emotional support dog Lives in: House/apartment (apt) Stairs: Yes: External: 1 steps; none; has ramp but doesn't use Has following equipment at home: Walker - 4 wheeled, Wheelchair (manual), shower chair, Grab bars, and handheld shower nozzle; BSC.  PLOF: Ambulatory independently prior to CVA.  R knee buckling and hyperextension noted during hospitalization.  R hand dominant.  Currently walking with [facility] or manual w/c at home depending on the situation.  PATIENT GOALS: To be able to talk right again; improve walking; improve strength.  OBJECTIVE:  Note: Objective measures were completed at Evaluation unless otherwise noted.  DIAGNOSTIC FINDINGS:  MRI Brain 03/22/24: 1. Acute small vessel bland infarct in the left periventricular white matter extending to the posterior limb of the left internal capsule. 2. Moderate cerebral white matter disease. 3. Age-related atrophy.  COGNITION: Overall cognitive status: A&Ox4; emotionally labile during session (reports since stroke)   SENSATION: Intact light touch B LE's (bottom of feet not assessed)  COORDINATION: Intact sitting heel to shin L LE; limited R LE d/t weakness  EDEMA:  No swelling noted B LE's  MUSCLE TONE: 2 beats clonus R ankle  POSTURE: rounded shoulders and forward head  LOWER EXTREMITY ROM:     Passive  Right Eval Left Eval  Hip flexion    Hip extension    Hip  abduction    Hip adduction    Hip internal rotation    Hip external rotation    Knee flexion    Knee extension    Ankle dorsiflexion Neutral   Ankle plantarflexion    Ankle inversion    Ankle eversion     (Blank rows = not tested)  LOWER EXTREMITY MMT:    MMT Right Eval Left Eval  Hip flexion 4-/5 (tends to ER R LE) 5/5  Hip extension    Hip abduction    Hip adduction    Hip internal rotation    Hip external rotation    Knee flexion 4/5 5/5  Knee extension 3-/5 5/5  Ankle dorsiflexion 1/5 5/5  Ankle plantarflexion At least 2/5 At least 3/5 AROM  Ankle inversion    Ankle eversion    (Blank rows = not tested)  BED MOBILITY:  Pt reports modified independent.  TRANSFERS: Sit to stand: SBA  Assistive device utilized: Counselling psychologist     Stand to sit: SBA  Assistive device utilized: Counselling psychologist      GAIT: Findings: Gait Characteristics: R foot drop; decreased R LE foot clearance; increased L lean to advance R LE (decreased R hip and knee flexion), Distance walked: 40 feet, Assistive device utilized:Quad cane small base, Level of assistance: CGA, and Comments: R knee hyperextension noted (pt did not bring R AFO)  FUNCTIONAL TESTS:  5 times sit to stand: 23.60 seconds (use of L UE) 10 meter walk test: 0.187 m/sec (53.28 seconds with SBQC) BERG Balance Test: 27/56 05/31/24  Item Test date: 05/31/24 Date:  Date:   Sitting to standing 3. able to stand independently using hands Insert SmartPhrase OPRCBERGREEVAL Insert SmartPhrase OPRCBERGREEVAL  2. Standing unsupported 3. able to stand 2 minutes with supervision    3. Sitting with back unsupported, feet supported 4. able to sit safely and securely for 2 minutes    4. Standing to sitting 3. controls descent  by using hands    5. Pivot transfer  1. needs one person to assist    6. Standing unsupported with eyes closed 3. able to stand 10 seconds with supervision    7. Standing unsupported with feet together 1. needs  help to attain position but able to stand 15 seconds feet together; use of SBQC and CGA to obtain position (x60 seconds in position)    8. Reaching forward with outstretched arms while standing 1. reaches forward but needs supervision    9. Pick up object from the floor from standing 3. able to pick up slipper but needs supervision    10. Turning to look behind over left and right shoulders while standing 3. looks behind one side only, other side shows less weight shift; decreased weight shift to R side    11. Turn 360 degrees 0. needs assistance while turning    12. Place alternate foot on step or stool while standing unsupported 0. needs assistance to keep from falling/unable to try    13. Standing unsupported one foot in front 1. needs help to step but can hold 15 seconds    14. Standing on one leg 1. tries to lift leg unable to hold 3 seconds but remains standing independently.      Total Score 27/56 Total Score:    Total Score:      PATIENT SURVEYS:  TBA                                                                                                                            TREATMENT DATE: 05/31/24   Self Care: BP and HR taken in sitting at rest beginning of session (see below for details). Vitals:   05/31/24 0854  BP: 132/64  Pulse: 61   Therapeutic Activity: BERG Balance Test: 27/56 (see above for detailed report)  Therapeutic Exercise (for HEP; vc's for technique including concentric and eccentric control): Seated March x10 reps R LE Seated Long Arc Quad x10 reps R LE Seated Hip Abduction with Resistance x10 reps with yellow thera-band  PATIENT EDUCATION: Education details: BERG Balance test results.  Issued HEP and yellow thera-band.  Bring R AFO for therapy. Person educated: Patient Education method: Explanation and Verbal cues Education comprehension: verbalized understanding, verbal cues required, and needs further education  HOME EXERCISE PROGRAM:  Issued  yellow-theraband Access Code: [email or web address] URL: [email or web address] Date: 05/31/2024 Prepared by: Damien Caulk  Exercises - Seated March  - 1 x daily - 3-5 x weekly - 1-2 sets - 5-10 reps - Seated Long Arc Quad  - 1 x daily - 3-5 x weekly - 1-2 sets - 5-10 reps - Seated Hip Abduction with Resistance  - 1 x daily - 3-5 x weekly - 1-2 sets - 5-10 reps  GOALS: Goals reviewed with patient? Yes  SHORT TERM GOALS: Target date: 06/14/2024  Pt will be at least 50% compliant with initial HEP in order to improve strength and balance  in order to decrease fall risk and improve function at home for ADL's. Baseline: TBA Goal status: INITIAL  2.  Assess BERG Balance Test and update LTG as needed. Baseline: 27/56 05/31/24 Goal status: INITIAL  3.  Patient will deny any falls over past 4 weeks to demonstrate improved safety awareness at home. Baseline: Fall about 2 weeks prior to PT evaluation. Goal status: INITIAL  4.  Pt will increase by at least 0.06 m/s in order to demonstrate improvement in ambulation.  Baseline: 0.187 m/sec Goal status: INITIAL   LONG TERM GOALS: Target date: 06/28/2024  Pt will be at least 75% compliant with final HEP in order to improve strength and balance in order to decrease fall risk and improve function at home for ADL's. Baseline: TBA Goal status: INITIAL  2.  Pt will decrease 5 Time Sit to Stand by at least 3 seconds in order to demonstrate clinically significant improvement in LE strength. Baseline: 23.60 seconds Goal status: INITIAL  3.  Pt will increase by at least 0.13 m/s in order to demonstrate clinically significant improvement in community ambulation.  Baseline: 0.187 m/sec Goal status: INITIAL  4.  Pt will improve BERG by at least 3 points in order to demonstrate clinically significant improvement in balance. Baseline: 27/56 05/31/24 Goal status: INITIAL  5.  Pt will ambulate 115 feet with LRAD and SBA level of assist to  promote household and community access. Baseline: 40 feet CGA with SBQC Goal status: INITIAL   ASSESSMENT:  CLINICAL IMPRESSION: Patient was seen today for first follow-up physical therapy treatment session (since Eval 05/17/24) to address outcome measure and HEP.  Focused session on assessing BERG Balance test and issuing HEP.  Pt scored 27/56 on BERG Balance test indicating pt is at a high fall risk (<40/56 = high fall risk; 40-45/56 = moderate fall risk; >45/56 = low fall risk). Pt demonstrates appropriate understanding of issued HEP and given HEP handout.  They would continue to benefit from skilled PT to address impairments as noted and progress towards long term goals.   OBJECTIVE IMPAIRMENTS: Abnormal gait, decreased activity tolerance, decreased balance, decreased cognition, decreased coordination, decreased endurance, decreased knowledge of condition, decreased knowledge of use of DME, decreased mobility, difficulty walking, decreased ROM, decreased strength, decreased safety awareness, impaired flexibility, impaired sensation, impaired tone, impaired UE functional use, postural dysfunction, and pain.   ACTIVITY LIMITATIONS: carrying, lifting, bending, standing, squatting, stairs, transfers, bathing, toileting, dressing, locomotion level, and caring for others  PARTICIPATION LIMITATIONS: meal prep, cleaning, laundry, driving, shopping, and community activity  PERSONAL FACTORS: Past/current experiences, Transportation, and 1-2 comorbidities: htn; h/o STEMI are also affecting patient's functional outcome.   REHAB POTENTIAL: Good  CLINICAL DECISION MAKING: Evolving/moderate complexity  EVALUATION COMPLEXITY: Moderate  PLAN:  PT FREQUENCY: 1-2x/week  PT DURATION: 6 weeks  PLANNED INTERVENTIONS: 97164- PT Re-evaluation, 97750- Physical Performance Testing, 97110-Therapeutic exercises, 97530- Therapeutic activity, [id number]- Neuromuscular re-education, 97535- Self Care, [id number]- Manual  therapy, [phone]- Gait training, [phone]- Orthotic Initial, [phone]- Orthotic/Prosthetic subsequent, [phone]- Aquatic Therapy, [phone]- Electrical stimulation (manual), Patient/Family education, Balance training, Stair training, Taping, Joint mobilization, Spinal mobilization, DME instructions, Wheelchair mobility training, Cryotherapy, Moist heat, and Biofeedback  PLAN FOR NEXT SESSION: Check on HEP.  LE strengthening; balance; transfers; gait.   Damien Caulk, PT 05/31/2024, 10:14 AM             "

## 2024-05-31 NOTE — Patient Instructions (Signed)
 Natasha Roach

## 2024-06-04 ENCOUNTER — Ambulatory Visit

## 2024-06-04 ENCOUNTER — Ambulatory Visit: Admitting: Physical Therapy

## 2024-06-07 ENCOUNTER — Ambulatory Visit

## 2024-06-07 ENCOUNTER — Ambulatory Visit: Admitting: Physical Therapy

## 2024-06-11 ENCOUNTER — Ambulatory Visit: Admitting: Occupational Therapy

## 2024-06-11 ENCOUNTER — Ambulatory Visit

## 2024-06-11 ENCOUNTER — Ambulatory Visit: Admitting: Physical Therapy

## 2024-06-14 ENCOUNTER — Ambulatory Visit: Admitting: Physical Therapy

## 2024-06-14 ENCOUNTER — Ambulatory Visit: Admitting: Occupational Therapy

## 2024-06-18 ENCOUNTER — Ambulatory Visit

## 2024-06-18 ENCOUNTER — Ambulatory Visit: Admitting: Physical Therapy

## 2024-06-21 ENCOUNTER — Ambulatory Visit

## 2024-06-21 ENCOUNTER — Ambulatory Visit: Admitting: Physical Therapy

## 2024-06-25 ENCOUNTER — Ambulatory Visit: Admitting: Physical Therapy

## 2024-06-25 ENCOUNTER — Ambulatory Visit

## 2024-06-28 ENCOUNTER — Ambulatory Visit: Admitting: Physical Therapy

## 2024-06-28 ENCOUNTER — Ambulatory Visit

## 2024-08-14 ENCOUNTER — Ambulatory Visit: Admitting: Adult Health
